# Patient Record
Sex: Female | Born: 1935 | Race: White | Hispanic: No | Smoking: Never smoker
Health system: Southern US, Community
[De-identification: ages and names within clinical notes are randomized; demographics above are authoritative.]

## PROBLEM LIST (undated history)

## (undated) DIAGNOSIS — J309 Allergic rhinitis, unspecified: Secondary | ICD-10-CM

## (undated) DIAGNOSIS — H6980 Other specified disorders of Eustachian tube, unspecified ear: Secondary | ICD-10-CM

## (undated) DIAGNOSIS — N183 Chronic kidney disease, stage 3 (moderate): Principal | ICD-10-CM

## (undated) DIAGNOSIS — E669 Obesity, unspecified: Secondary | ICD-10-CM

## (undated) DIAGNOSIS — I2510 Atherosclerotic heart disease of native coronary artery without angina pectoris: Secondary | ICD-10-CM

## (undated) DIAGNOSIS — H3530 Unspecified macular degeneration: Secondary | ICD-10-CM

## (undated) DIAGNOSIS — D631 Anemia in chronic kidney disease: Principal | ICD-10-CM

## (undated) DIAGNOSIS — J4489 Other specified chronic obstructive pulmonary disease: Secondary | ICD-10-CM

## (undated) DIAGNOSIS — J449 Chronic obstructive pulmonary disease, unspecified: Secondary | ICD-10-CM

## (undated) DIAGNOSIS — K219 Gastro-esophageal reflux disease without esophagitis: Secondary | ICD-10-CM

## (undated) DIAGNOSIS — D509 Iron deficiency anemia, unspecified: Secondary | ICD-10-CM

## (undated) DIAGNOSIS — H6990 Unspecified Eustachian tube disorder, unspecified ear: Secondary | ICD-10-CM

## (undated) HISTORY — DX: Other specified chronic obstructive pulmonary disease: J44.89

## (undated) HISTORY — DX: Chronic obstructive pulmonary disease, unspecified: J44.9

## (undated) HISTORY — DX: Obesity, unspecified: E66.9

## (undated) HISTORY — DX: Atherosclerotic heart disease of native coronary artery without angina pectoris: I25.10

## (undated) HISTORY — DX: Anemia in chronic kidney disease: D63.1

## (undated) HISTORY — DX: Unspecified eustachian tube disorder, unspecified ear: H69.90

## (undated) HISTORY — DX: Gastro-esophageal reflux disease without esophagitis: K21.9

## (undated) HISTORY — PX: CORONARY ARTERY BYPASS GRAFT: SHX141

## (undated) HISTORY — DX: Other specified disorders of Eustachian tube, unspecified ear: H69.80

## (undated) HISTORY — DX: Iron deficiency anemia, unspecified: D50.9

## (undated) HISTORY — DX: Unspecified macular degeneration: H35.30

## (undated) HISTORY — DX: Allergic rhinitis, unspecified: J30.9

## (undated) HISTORY — DX: Chronic kidney disease, stage 3 (moderate): N18.3

---

## 1997-05-19 ENCOUNTER — Encounter (HOSPITAL_COMMUNITY): Admission: RE | Admit: 1997-05-19 | Discharge: 1997-08-17 | Payer: Self-pay | Admitting: Interventional Cardiology

## 1997-06-14 ENCOUNTER — Other Ambulatory Visit: Admission: RE | Admit: 1997-06-14 | Discharge: 1997-06-14 | Payer: Self-pay | Admitting: Internal Medicine

## 1997-08-29 ENCOUNTER — Other Ambulatory Visit: Admission: RE | Admit: 1997-08-29 | Discharge: 1997-08-29 | Payer: Self-pay | Admitting: Internal Medicine

## 1998-02-20 ENCOUNTER — Ambulatory Visit (HOSPITAL_COMMUNITY): Admission: RE | Admit: 1998-02-20 | Discharge: 1998-02-20 | Payer: Self-pay | Admitting: Hematology and Oncology

## 1998-12-05 ENCOUNTER — Other Ambulatory Visit: Admission: RE | Admit: 1998-12-05 | Discharge: 1998-12-05 | Payer: Self-pay | Admitting: Otolaryngology

## 1999-02-21 ENCOUNTER — Encounter: Payer: Self-pay | Admitting: Hematology and Oncology

## 1999-05-09 ENCOUNTER — Encounter: Payer: Self-pay | Admitting: Otolaryngology

## 1999-05-11 ENCOUNTER — Encounter (INDEPENDENT_AMBULATORY_CARE_PROVIDER_SITE_OTHER): Payer: Self-pay | Admitting: Specialist

## 1999-05-11 ENCOUNTER — Encounter: Payer: Self-pay | Admitting: Otolaryngology

## 1999-05-11 ENCOUNTER — Observation Stay (HOSPITAL_COMMUNITY): Admission: RE | Admit: 1999-05-11 | Discharge: 1999-05-12 | Payer: Self-pay | Admitting: Otolaryngology

## 2000-06-25 ENCOUNTER — Ambulatory Visit (HOSPITAL_COMMUNITY): Admission: RE | Admit: 2000-06-25 | Discharge: 2000-06-25 | Payer: Self-pay | Admitting: Gastroenterology

## 2004-02-07 ENCOUNTER — Ambulatory Visit: Payer: Self-pay | Admitting: Hematology & Oncology

## 2004-04-27 ENCOUNTER — Ambulatory Visit: Payer: Self-pay | Admitting: Hematology & Oncology

## 2004-06-11 ENCOUNTER — Ambulatory Visit (HOSPITAL_COMMUNITY): Admission: RE | Admit: 2004-06-11 | Discharge: 2004-06-11 | Payer: Self-pay | Admitting: Hematology & Oncology

## 2004-06-29 ENCOUNTER — Ambulatory Visit: Payer: Self-pay | Admitting: Hematology & Oncology

## 2004-07-17 ENCOUNTER — Ambulatory Visit (HOSPITAL_COMMUNITY): Admission: RE | Admit: 2004-07-17 | Discharge: 2004-07-17 | Payer: Self-pay | Admitting: Interventional Cardiology

## 2004-08-24 ENCOUNTER — Ambulatory Visit: Payer: Self-pay | Admitting: Internal Medicine

## 2004-09-04 ENCOUNTER — Ambulatory Visit: Payer: Self-pay | Admitting: Hematology & Oncology

## 2004-09-24 ENCOUNTER — Encounter: Admission: RE | Admit: 2004-09-24 | Discharge: 2004-09-24 | Payer: Self-pay | Admitting: Internal Medicine

## 2004-09-25 ENCOUNTER — Ambulatory Visit: Payer: Self-pay | Admitting: Internal Medicine

## 2004-09-26 ENCOUNTER — Emergency Department (HOSPITAL_COMMUNITY): Admission: EM | Admit: 2004-09-26 | Discharge: 2004-09-26 | Payer: Self-pay | Admitting: Family Medicine

## 2004-10-10 ENCOUNTER — Encounter: Admission: RE | Admit: 2004-10-10 | Discharge: 2004-10-10 | Payer: Self-pay | Admitting: Orthopedic Surgery

## 2004-10-30 ENCOUNTER — Ambulatory Visit: Payer: Self-pay | Admitting: Hematology & Oncology

## 2004-12-19 ENCOUNTER — Ambulatory Visit: Payer: Self-pay | Admitting: Hematology & Oncology

## 2004-12-27 ENCOUNTER — Ambulatory Visit: Payer: Self-pay | Admitting: Internal Medicine

## 2005-02-13 ENCOUNTER — Ambulatory Visit: Payer: Self-pay | Admitting: Hematology & Oncology

## 2005-04-10 ENCOUNTER — Ambulatory Visit: Payer: Self-pay | Admitting: Hematology & Oncology

## 2005-06-05 ENCOUNTER — Ambulatory Visit: Payer: Self-pay | Admitting: Hematology & Oncology

## 2005-06-06 LAB — CBC WITH DIFFERENTIAL/PLATELET
Basophils Absolute: 0 10*3/uL (ref 0.0–0.1)
EOS%: 3.7 % (ref 0.0–7.0)
HCT: 33.6 % — ABNORMAL LOW (ref 34.8–46.6)
HGB: 11 g/dL — ABNORMAL LOW (ref 11.6–15.9)
LYMPH%: 25.1 % (ref 14.0–48.0)
MCH: 28 pg (ref 26.0–34.0)
MCV: 85.6 fL (ref 81.0–101.0)
MONO%: 9.1 % (ref 0.0–13.0)
NEUT%: 61.5 % (ref 39.6–76.8)
Platelets: 386 10*3/uL (ref 145–400)
lymph#: 1.6 10*3/uL (ref 0.9–3.3)

## 2005-06-27 ENCOUNTER — Ambulatory Visit: Payer: Self-pay | Admitting: Internal Medicine

## 2005-07-04 LAB — CBC WITH DIFFERENTIAL/PLATELET
BASO%: 0.5 % (ref 0.0–2.0)
Basophils Absolute: 0 10*3/uL (ref 0.0–0.1)
EOS%: 4.2 % (ref 0.0–7.0)
Eosinophils Absolute: 0.2 10*3/uL (ref 0.0–0.5)
HCT: 35.4 % (ref 34.8–46.6)
HGB: 11.6 g/dL (ref 11.6–15.9)
LYMPH%: 29.8 % (ref 14.0–48.0)
MCH: 27.8 pg (ref 26.0–34.0)
MCHC: 32.8 g/dL (ref 32.0–36.0)
MCV: 84.7 fL (ref 81.0–101.0)
MONO#: 0.5 10*3/uL (ref 0.1–0.9)
MONO%: 9.4 % (ref 0.0–13.0)
NEUT#: 3.1 10*3/uL (ref 1.5–6.5)
NEUT%: 56.1 % (ref 39.6–76.8)
Platelets: 378 10*3/uL (ref 145–400)
RBC: 4.18 10*6/uL (ref 3.70–5.32)
RDW: 15.4 % — ABNORMAL HIGH (ref 11.3–14.5)
WBC: 5.6 10*3/uL (ref 3.9–10.0)
lymph#: 1.7 10*3/uL (ref 0.9–3.3)

## 2005-07-17 ENCOUNTER — Ambulatory Visit: Payer: Self-pay | Admitting: Hematology & Oncology

## 2005-07-17 LAB — CBC & DIFF AND RETIC
BASO%: 0.4 % (ref 0.0–2.0)
EOS%: 4.7 % (ref 0.0–7.0)
HCT: 35.6 % (ref 34.8–46.6)
LYMPH%: 26.6 % (ref 14.0–48.0)
MCH: 27.5 pg (ref 26.0–34.0)
MCHC: 32.5 g/dL (ref 32.0–36.0)
MONO%: 11.1 % (ref 0.0–13.0)
NEUT%: 57.2 % (ref 39.6–76.8)
Platelets: 342 10*3/uL (ref 145–400)
RBC: 4.2 10*6/uL (ref 3.70–5.32)
Retic %: 2.7 % — ABNORMAL HIGH (ref 0.4–2.3)

## 2005-08-01 LAB — CBC WITH DIFFERENTIAL/PLATELET
Basophils Absolute: 0 10*3/uL (ref 0.0–0.1)
Eosinophils Absolute: 0.3 10*3/uL (ref 0.0–0.5)
HCT: 32.9 % — ABNORMAL LOW (ref 34.8–46.6)
HGB: 10.7 g/dL — ABNORMAL LOW (ref 11.6–15.9)
LYMPH%: 25.9 % (ref 14.0–48.0)
MCV: 82.8 fL (ref 81.0–101.0)
MONO#: 0.5 10*3/uL (ref 0.1–0.9)
MONO%: 9.2 % (ref 0.0–13.0)
NEUT#: 3.5 10*3/uL (ref 1.5–6.5)
Platelets: 368 10*3/uL (ref 145–400)
RBC: 3.97 10*6/uL (ref 3.70–5.32)
WBC: 5.8 10*3/uL (ref 3.9–10.0)

## 2005-08-12 ENCOUNTER — Encounter: Admission: RE | Admit: 2005-08-12 | Discharge: 2005-08-12 | Payer: Self-pay | Admitting: Neurosurgery

## 2005-08-25 ENCOUNTER — Observation Stay (HOSPITAL_COMMUNITY): Admission: EM | Admit: 2005-08-25 | Discharge: 2005-08-26 | Payer: Self-pay | Admitting: Family Medicine

## 2005-08-29 ENCOUNTER — Ambulatory Visit: Payer: Self-pay | Admitting: Hematology & Oncology

## 2005-08-29 LAB — CBC WITH DIFFERENTIAL/PLATELET
BASO%: 0.5 % (ref 0.0–2.0)
LYMPH%: 21.3 % (ref 14.0–48.0)
MCHC: 32.8 g/dL (ref 32.0–36.0)
MONO#: 0.7 10*3/uL (ref 0.1–0.9)
MONO%: 9.8 % (ref 0.0–13.0)
Platelets: 406 10*3/uL — ABNORMAL HIGH (ref 145–400)
RBC: 4 10*6/uL (ref 3.70–5.32)
RDW: 16.5 % — ABNORMAL HIGH (ref 11.3–14.5)
WBC: 7.3 10*3/uL (ref 3.9–10.0)

## 2005-09-26 ENCOUNTER — Ambulatory Visit: Payer: Self-pay | Admitting: Hematology & Oncology

## 2005-09-26 LAB — CBC WITH DIFFERENTIAL/PLATELET
BASO%: 0.6 % (ref 0.0–2.0)
LYMPH%: 22.1 % (ref 14.0–48.0)
MCH: 25.9 pg — ABNORMAL LOW (ref 26.0–34.0)
MCHC: 32.6 g/dL (ref 32.0–36.0)
MCV: 79.6 fL — ABNORMAL LOW (ref 81.0–101.0)
MONO%: 9.1 % (ref 0.0–13.0)
Platelets: 383 10*3/uL (ref 145–400)
RBC: 3.62 10*6/uL — ABNORMAL LOW (ref 3.70–5.32)

## 2005-10-22 ENCOUNTER — Ambulatory Visit: Payer: Self-pay | Admitting: Hematology & Oncology

## 2005-10-24 LAB — CBC WITH DIFFERENTIAL/PLATELET
BASO%: 0.5 % (ref 0.0–2.0)
EOS%: 1.9 % (ref 0.0–7.0)
LYMPH%: 14.9 % (ref 14.0–48.0)
MCHC: 32.5 g/dL (ref 32.0–36.0)
MONO#: 0.4 10*3/uL (ref 0.1–0.9)
RBC: 3.55 10*6/uL — ABNORMAL LOW (ref 3.70–5.32)
WBC: 7.4 10*3/uL (ref 3.9–10.0)
lymph#: 1.1 10*3/uL (ref 0.9–3.3)

## 2005-10-31 ENCOUNTER — Encounter: Admission: RE | Admit: 2005-10-31 | Discharge: 2005-10-31 | Payer: Self-pay | Admitting: Internal Medicine

## 2005-11-07 ENCOUNTER — Encounter: Admission: RE | Admit: 2005-11-07 | Discharge: 2005-11-07 | Payer: Self-pay | Admitting: Internal Medicine

## 2005-11-13 LAB — CBC WITH DIFFERENTIAL/PLATELET
Basophils Absolute: 0 10*3/uL (ref 0.0–0.1)
Eosinophils Absolute: 0.3 10*3/uL (ref 0.0–0.5)
HCT: 25 % — ABNORMAL LOW (ref 34.8–46.6)
HGB: 8.2 g/dL — ABNORMAL LOW (ref 11.6–15.9)
LYMPH%: 23.8 % (ref 14.0–48.0)
MONO#: 0.5 10*3/uL (ref 0.1–0.9)
NEUT%: 62 % (ref 39.6–76.8)
Platelets: 388 10*3/uL (ref 145–400)
WBC: 5.5 10*3/uL (ref 3.9–10.0)
lymph#: 1.3 10*3/uL (ref 0.9–3.3)

## 2005-11-14 ENCOUNTER — Encounter: Admission: RE | Admit: 2005-11-14 | Discharge: 2005-11-14 | Payer: Self-pay | Admitting: Internal Medicine

## 2005-11-15 LAB — LACTATE DEHYDROGENASE: LDH: 156 U/L (ref 94–250)

## 2005-11-15 LAB — COMPREHENSIVE METABOLIC PANEL
ALT: 12 U/L (ref 0–40)
BUN: 12 mg/dL (ref 6–23)
CO2: 27 mEq/L (ref 19–32)
Calcium: 9.5 mg/dL (ref 8.4–10.5)
Chloride: 102 mEq/L (ref 96–112)
Creatinine, Ser: 0.74 mg/dL (ref 0.40–1.20)
Glucose, Bld: 102 mg/dL — ABNORMAL HIGH (ref 70–99)
Total Bilirubin: 0.6 mg/dL (ref 0.3–1.2)

## 2005-11-15 LAB — FERRITIN: Ferritin: 14 ng/mL (ref 10–291)

## 2005-12-23 ENCOUNTER — Ambulatory Visit: Payer: Self-pay | Admitting: Hematology & Oncology

## 2005-12-25 LAB — CHCC SMEAR

## 2005-12-25 LAB — CBC & DIFF AND RETIC
BASO%: 0.3 % (ref 0.0–2.0)
EOS%: 4.2 % (ref 0.0–7.0)
HCT: 31.4 % — ABNORMAL LOW (ref 34.8–46.6)
IRF: 0.37 — ABNORMAL HIGH (ref 0.130–0.330)
MCH: 26.8 pg (ref 26.0–34.0)
MCHC: 32.9 g/dL (ref 32.0–36.0)
NEUT%: 66.8 % (ref 39.6–76.8)
lymph#: 1.3 10*3/uL (ref 0.9–3.3)

## 2005-12-26 ENCOUNTER — Ambulatory Visit: Payer: Self-pay | Admitting: Internal Medicine

## 2006-01-22 LAB — CBC WITH DIFFERENTIAL/PLATELET
Eosinophils Absolute: 0.3 10*3/uL (ref 0.0–0.5)
HCT: 32.4 % — ABNORMAL LOW (ref 34.8–46.6)
LYMPH%: 20.3 % (ref 14.0–48.0)
MONO#: 0.5 10*3/uL (ref 0.1–0.9)
NEUT#: 4.4 10*3/uL (ref 1.5–6.5)
NEUT%: 67.3 % (ref 39.6–76.8)
Platelets: 401 10*3/uL — ABNORMAL HIGH (ref 145–400)
WBC: 6.6 10*3/uL (ref 3.9–10.0)

## 2006-01-23 ENCOUNTER — Ambulatory Visit: Payer: Self-pay | Admitting: Internal Medicine

## 2006-02-13 ENCOUNTER — Ambulatory Visit: Payer: Self-pay | Admitting: Hematology & Oncology

## 2006-02-17 LAB — CBC WITH DIFFERENTIAL/PLATELET
BASO%: 0.6 % (ref 0.0–2.0)
EOS%: 4.7 % (ref 0.0–7.0)
HCT: 36 % (ref 34.8–46.6)
LYMPH%: 21.6 % (ref 14.0–48.0)
MCH: 26.4 pg (ref 26.0–34.0)
MCHC: 31.4 g/dL — ABNORMAL LOW (ref 32.0–36.0)
MCV: 84.2 fL (ref 81.0–101.0)
MONO%: 7.9 % (ref 0.0–13.0)
NEUT%: 65.2 % (ref 39.6–76.8)
Platelets: 419 10*3/uL — ABNORMAL HIGH (ref 145–400)

## 2006-02-19 ENCOUNTER — Inpatient Hospital Stay (HOSPITAL_COMMUNITY): Admission: RE | Admit: 2006-02-19 | Discharge: 2006-02-22 | Payer: Self-pay | Admitting: Neurosurgery

## 2006-03-17 ENCOUNTER — Ambulatory Visit: Payer: Self-pay | Admitting: Internal Medicine

## 2006-03-19 LAB — CBC WITH DIFFERENTIAL/PLATELET
Basophils Absolute: 0 10*3/uL (ref 0.0–0.1)
Eosinophils Absolute: 0.4 10*3/uL (ref 0.0–0.5)
HCT: 31.9 % — ABNORMAL LOW (ref 34.8–46.6)
HGB: 10.4 g/dL — ABNORMAL LOW (ref 11.6–15.9)
LYMPH%: 17.3 % (ref 14.0–48.0)
MCV: 84 fL (ref 81.0–101.0)
MONO#: 0.6 10*3/uL (ref 0.1–0.9)
MONO%: 7.5 % (ref 0.0–13.0)
NEUT#: 5.5 10*3/uL (ref 1.5–6.5)
Platelets: 408 10*3/uL — ABNORMAL HIGH (ref 145–400)
WBC: 7.9 10*3/uL (ref 3.9–10.0)

## 2006-03-26 LAB — CBC WITH DIFFERENTIAL/PLATELET
Eosinophils Absolute: 0.2 10*3/uL (ref 0.0–0.5)
HCT: 31.5 % — ABNORMAL LOW (ref 34.8–46.6)
LYMPH%: 19.1 % (ref 14.0–48.0)
MCHC: 33 g/dL (ref 32.0–36.0)
MCV: 83.8 fL (ref 81.0–101.0)
MONO#: 0.6 10*3/uL (ref 0.1–0.9)
MONO%: 10 % (ref 0.0–13.0)
NEUT%: 66.7 % (ref 39.6–76.8)
Platelets: 395 10*3/uL (ref 145–400)
RBC: 3.75 10*6/uL (ref 3.70–5.32)
WBC: 6.4 10*3/uL (ref 3.9–10.0)

## 2006-03-28 LAB — FERRITIN: Ferritin: 144 ng/mL (ref 10–291)

## 2006-04-14 ENCOUNTER — Ambulatory Visit: Payer: Self-pay | Admitting: Hematology & Oncology

## 2006-04-16 LAB — CBC WITH DIFFERENTIAL/PLATELET
Basophils Absolute: 0 10*3/uL (ref 0.0–0.1)
EOS%: 4.6 % (ref 0.0–7.0)
Eosinophils Absolute: 0.3 10*3/uL (ref 0.0–0.5)
HGB: 10.8 g/dL — ABNORMAL LOW (ref 11.6–15.9)
LYMPH%: 22.4 % (ref 14.0–48.0)
MCH: 27 pg (ref 26.0–34.0)
MCV: 82.3 fL (ref 81.0–101.0)
MONO%: 8.5 % (ref 0.0–13.0)
NEUT#: 4.5 10*3/uL (ref 1.5–6.5)
NEUT%: 64 % (ref 39.6–76.8)
Platelets: 398 10*3/uL (ref 145–400)

## 2006-04-25 ENCOUNTER — Emergency Department (HOSPITAL_COMMUNITY): Admission: EM | Admit: 2006-04-25 | Discharge: 2006-04-25 | Payer: Self-pay | Admitting: Emergency Medicine

## 2006-05-14 LAB — CBC WITH DIFFERENTIAL/PLATELET
BASO%: 0.5 % (ref 0.0–2.0)
EOS%: 3.9 % (ref 0.0–7.0)
Eosinophils Absolute: 0.3 10*3/uL (ref 0.0–0.5)
LYMPH%: 18.8 % (ref 14.0–48.0)
MCH: 26.8 pg (ref 26.0–34.0)
MCHC: 32.9 g/dL (ref 32.0–36.0)
MCV: 81.3 fL (ref 81.0–101.0)
MONO%: 7.8 % (ref 0.0–13.0)
Platelets: 436 10*3/uL — ABNORMAL HIGH (ref 145–400)
RBC: 4.16 10*6/uL (ref 3.70–5.32)
RDW: 16.7 % — ABNORMAL HIGH (ref 11.3–14.5)

## 2006-06-06 ENCOUNTER — Ambulatory Visit: Payer: Self-pay | Admitting: Hematology & Oncology

## 2006-06-11 LAB — CBC WITH DIFFERENTIAL/PLATELET
BASO%: 0.6 % (ref 0.0–2.0)
EOS%: 3.7 % (ref 0.0–7.0)
HCT: 31.3 % — ABNORMAL LOW (ref 34.8–46.6)
MCH: 26.5 pg (ref 26.0–34.0)
MCHC: 33 g/dL (ref 32.0–36.0)
NEUT%: 66.7 % (ref 39.6–76.8)
lymph#: 1.5 10*3/uL (ref 0.9–3.3)

## 2006-06-16 ENCOUNTER — Ambulatory Visit: Payer: Self-pay | Admitting: Internal Medicine

## 2006-06-26 ENCOUNTER — Encounter: Admission: RE | Admit: 2006-06-26 | Discharge: 2006-06-26 | Payer: Self-pay | Admitting: Geriatric Medicine

## 2006-07-09 LAB — CBC WITH DIFFERENTIAL/PLATELET
BASO%: 0.8 % (ref 0.0–2.0)
Basophils Absolute: 0 10*3/uL (ref 0.0–0.1)
EOS%: 6.5 % (ref 0.0–7.0)
HGB: 10.8 g/dL — ABNORMAL LOW (ref 11.6–15.9)
MCH: 25.6 pg — ABNORMAL LOW (ref 26.0–34.0)
MCHC: 32.7 g/dL (ref 32.0–36.0)
RDW: 16.3 % — ABNORMAL HIGH (ref 11.3–14.5)
lymph#: 1.5 10*3/uL (ref 0.9–3.3)

## 2006-08-04 ENCOUNTER — Ambulatory Visit: Payer: Self-pay | Admitting: Hematology & Oncology

## 2006-08-06 LAB — CBC WITH DIFFERENTIAL/PLATELET
Basophils Absolute: 0.1 10*3/uL (ref 0.0–0.1)
Eosinophils Absolute: 0.3 10*3/uL (ref 0.0–0.5)
HGB: 9.6 g/dL — ABNORMAL LOW (ref 11.6–15.9)
MONO#: 0.5 10*3/uL (ref 0.1–0.9)
NEUT#: 3.4 10*3/uL (ref 1.5–6.5)
RDW: 16.6 % — ABNORMAL HIGH (ref 11.3–14.5)
lymph#: 1.4 10*3/uL (ref 0.9–3.3)

## 2006-09-03 LAB — CBC WITH DIFFERENTIAL/PLATELET
Eosinophils Absolute: 0.3 10*3/uL (ref 0.0–0.5)
HCT: 31.2 % — ABNORMAL LOW (ref 34.8–46.6)
LYMPH%: 24.2 % (ref 14.0–48.0)
MONO#: 0.6 10*3/uL (ref 0.1–0.9)
NEUT#: 3.5 10*3/uL (ref 1.5–6.5)
NEUT%: 59.8 % (ref 39.6–76.8)
Platelets: 386 10*3/uL (ref 145–400)
WBC: 5.9 10*3/uL (ref 3.9–10.0)

## 2006-09-12 LAB — CBC WITH DIFFERENTIAL/PLATELET
Basophils Absolute: 0 10*3/uL (ref 0.0–0.1)
EOS%: 4.4 % (ref 0.0–7.0)
HCT: 33.3 % — ABNORMAL LOW (ref 34.8–46.6)
HGB: 11.1 g/dL — ABNORMAL LOW (ref 11.6–15.9)
MCH: 27.4 pg (ref 26.0–34.0)
MCV: 82.5 fL (ref 81.0–101.0)
MONO%: 10.7 % (ref 0.0–13.0)
NEUT%: 63.5 % (ref 39.6–76.8)
lymph#: 1.3 10*3/uL (ref 0.9–3.3)

## 2006-09-16 ENCOUNTER — Ambulatory Visit: Payer: Self-pay | Admitting: Hematology & Oncology

## 2006-09-18 ENCOUNTER — Ambulatory Visit (HOSPITAL_COMMUNITY): Admission: RE | Admit: 2006-09-18 | Discharge: 2006-09-18 | Payer: Self-pay | Admitting: Hematology & Oncology

## 2006-10-08 LAB — CBC WITH DIFFERENTIAL/PLATELET
Eosinophils Absolute: 0.3 10*3/uL (ref 0.0–0.5)
LYMPH%: 25.9 % (ref 14.0–48.0)
MCV: 83 fL (ref 81.0–101.0)
MONO%: 10.2 % (ref 0.0–13.0)
NEUT#: 3.2 10*3/uL (ref 1.5–6.5)
NEUT%: 57.9 % (ref 39.6–76.8)
Platelets: 349 10*3/uL (ref 145–400)
RBC: 4.45 10*6/uL (ref 3.70–5.32)

## 2006-10-28 ENCOUNTER — Ambulatory Visit: Payer: Self-pay | Admitting: Hematology & Oncology

## 2006-10-30 LAB — CBC WITH DIFFERENTIAL/PLATELET
BASO%: 0.5 % (ref 0.0–2.0)
EOS%: 5.9 % (ref 0.0–7.0)
LYMPH%: 27.8 % (ref 14.0–48.0)
MCH: 28 pg (ref 26.0–34.0)
MCHC: 33.7 g/dL (ref 32.0–36.0)
MCV: 83.2 fL (ref 81.0–101.0)
MONO#: 0.5 10*3/uL (ref 0.1–0.9)
MONO%: 9 % (ref 0.0–13.0)
Platelets: 308 10*3/uL (ref 145–400)
RBC: 3.86 10*6/uL (ref 3.70–5.32)
WBC: 5.4 10*3/uL (ref 3.9–10.0)

## 2006-10-30 LAB — FERRITIN: Ferritin: 280 ng/mL (ref 10–291)

## 2006-11-19 LAB — CBC WITH DIFFERENTIAL/PLATELET
BASO%: 0.5 % (ref 0.0–2.0)
EOS%: 4.5 % (ref 0.0–7.0)
MCH: 28.1 pg (ref 26.0–34.0)
MCHC: 33.5 g/dL (ref 32.0–36.0)
MCV: 83.8 fL (ref 81.0–101.0)
MONO%: 9.6 % (ref 0.0–13.0)
RDW: 17.8 % — ABNORMAL HIGH (ref 11.3–14.5)
lymph#: 1.3 10*3/uL (ref 0.9–3.3)

## 2006-12-08 ENCOUNTER — Ambulatory Visit: Payer: Self-pay | Admitting: Hematology & Oncology

## 2006-12-10 LAB — CBC WITH DIFFERENTIAL/PLATELET
EOS%: 4.7 % (ref 0.0–7.0)
HGB: 11.6 g/dL (ref 11.6–15.9)
MCH: 27.4 pg (ref 26.0–34.0)
MCV: 82.3 fL (ref 81.0–101.0)
MONO%: 10.6 % (ref 0.0–13.0)
NEUT#: 3.2 10*3/uL (ref 1.5–6.5)
RBC: 4.24 10*6/uL (ref 3.70–5.32)
RDW: 16.6 % — ABNORMAL HIGH (ref 11.3–14.5)
lymph#: 1.3 10*3/uL (ref 0.9–3.3)

## 2006-12-13 DIAGNOSIS — D509 Iron deficiency anemia, unspecified: Secondary | ICD-10-CM | POA: Insufficient documentation

## 2006-12-13 DIAGNOSIS — J4489 Other specified chronic obstructive pulmonary disease: Secondary | ICD-10-CM | POA: Insufficient documentation

## 2006-12-13 DIAGNOSIS — R0602 Shortness of breath: Secondary | ICD-10-CM | POA: Insufficient documentation

## 2006-12-13 DIAGNOSIS — J449 Chronic obstructive pulmonary disease, unspecified: Secondary | ICD-10-CM | POA: Insufficient documentation

## 2006-12-13 DIAGNOSIS — I2510 Atherosclerotic heart disease of native coronary artery without angina pectoris: Secondary | ICD-10-CM | POA: Insufficient documentation

## 2006-12-13 DIAGNOSIS — H6980 Other specified disorders of Eustachian tube, unspecified ear: Secondary | ICD-10-CM | POA: Insufficient documentation

## 2006-12-13 DIAGNOSIS — E669 Obesity, unspecified: Secondary | ICD-10-CM | POA: Insufficient documentation

## 2006-12-13 DIAGNOSIS — J309 Allergic rhinitis, unspecified: Secondary | ICD-10-CM | POA: Insufficient documentation

## 2006-12-15 ENCOUNTER — Ambulatory Visit: Payer: Self-pay | Admitting: Internal Medicine

## 2006-12-31 LAB — CBC WITH DIFFERENTIAL/PLATELET
Basophils Absolute: 0 10*3/uL (ref 0.0–0.1)
Eosinophils Absolute: 0.3 10*3/uL (ref 0.0–0.5)
HGB: 11.4 g/dL — ABNORMAL LOW (ref 11.6–15.9)
MONO#: 0.5 10*3/uL (ref 0.1–0.9)
NEUT#: 4.1 10*3/uL (ref 1.5–6.5)
RBC: 4.23 10*6/uL (ref 3.70–5.32)
RDW: 16.7 % — ABNORMAL HIGH (ref 11.3–14.5)
WBC: 6.2 10*3/uL (ref 3.9–10.0)
lymph#: 1.3 10*3/uL (ref 0.9–3.3)

## 2007-01-01 ENCOUNTER — Encounter: Admission: RE | Admit: 2007-01-01 | Discharge: 2007-01-01 | Payer: Self-pay | Admitting: Internal Medicine

## 2007-01-27 ENCOUNTER — Ambulatory Visit: Payer: Self-pay | Admitting: Hematology & Oncology

## 2007-01-29 LAB — COMPREHENSIVE METABOLIC PANEL
BUN: 14 mg/dL (ref 6–23)
CO2: 26 mEq/L (ref 19–32)
Glucose, Bld: 99 mg/dL (ref 70–99)
Sodium: 138 mEq/L (ref 135–145)
Total Bilirubin: 0.6 mg/dL (ref 0.3–1.2)
Total Protein: 6.7 g/dL (ref 6.0–8.3)

## 2007-01-29 LAB — CBC WITH DIFFERENTIAL/PLATELET
Basophils Absolute: 0 10*3/uL (ref 0.0–0.1)
Eosinophils Absolute: 0.2 10*3/uL (ref 0.0–0.5)
HCT: 32.6 % — ABNORMAL LOW (ref 34.8–46.6)
HGB: 10.8 g/dL — ABNORMAL LOW (ref 11.6–15.9)
LYMPH%: 23.9 % (ref 14.0–48.0)
MONO#: 0.5 10*3/uL (ref 0.1–0.9)
NEUT#: 3.6 10*3/uL (ref 1.5–6.5)
NEUT%: 63.1 % (ref 39.6–76.8)
Platelets: 354 10*3/uL (ref 145–400)
RBC: 4.06 10*6/uL (ref 3.70–5.32)
WBC: 5.8 10*3/uL (ref 3.9–10.0)

## 2007-01-29 LAB — FERRITIN: Ferritin: 158 ng/mL (ref 10–291)

## 2007-03-10 ENCOUNTER — Ambulatory Visit: Payer: Self-pay | Admitting: Hematology & Oncology

## 2007-03-12 LAB — CBC & DIFF AND RETIC
Basophils Absolute: 0 10*3/uL (ref 0.0–0.1)
Eosinophils Absolute: 0.2 10*3/uL (ref 0.0–0.5)
HCT: 32.9 % — ABNORMAL LOW (ref 34.8–46.6)
HGB: 10.9 g/dL — ABNORMAL LOW (ref 11.6–15.9)
IRF: 0.38 — ABNORMAL HIGH (ref 0.130–0.330)
LYMPH%: 12.7 % — ABNORMAL LOW (ref 14.0–48.0)
MONO#: 0.5 10*3/uL (ref 0.1–0.9)
NEUT%: 77.3 % — ABNORMAL HIGH (ref 39.6–76.8)
Platelets: 356 10*3/uL (ref 145–400)
WBC: 7.4 10*3/uL (ref 3.9–10.0)
lymph#: 0.9 10*3/uL (ref 0.9–3.3)

## 2007-03-12 LAB — CHCC SMEAR

## 2007-03-12 LAB — FERRITIN: Ferritin: 977 ng/mL — ABNORMAL HIGH (ref 10–291)

## 2007-03-26 ENCOUNTER — Encounter: Payer: Self-pay | Admitting: Internal Medicine

## 2007-04-02 LAB — CBC & DIFF AND RETIC
Basophils Absolute: 0 10*3/uL (ref 0.0–0.1)
EOS%: 5 % (ref 0.0–7.0)
Eosinophils Absolute: 0.3 10*3/uL (ref 0.0–0.5)
LYMPH%: 24.3 % (ref 14.0–48.0)
MCH: 26 pg (ref 26.0–34.0)
MCV: 84.3 fL (ref 81.0–101.0)
MONO%: 7.2 % (ref 0.0–13.0)
NEUT#: 3.4 10*3/uL (ref 1.5–6.5)
Platelets: 485 10*3/uL — ABNORMAL HIGH (ref 145–400)
RBC: 4.47 10*6/uL (ref 3.70–5.32)
Retic %: 1.1 % (ref 0.4–2.3)

## 2007-04-28 ENCOUNTER — Ambulatory Visit: Payer: Self-pay | Admitting: Hematology & Oncology

## 2007-04-30 LAB — CBC & DIFF AND RETIC
BASO%: 0.7 % (ref 0.0–2.0)
EOS%: 5.4 % (ref 0.0–7.0)
HCT: 33.8 % — ABNORMAL LOW (ref 34.8–46.6)
LYMPH%: 25.2 % (ref 14.0–48.0)
MCH: 28.9 pg (ref 26.0–34.0)
MCHC: 34 g/dL (ref 32.0–36.0)
MCV: 85 fL (ref 81.0–101.0)
MONO%: 10.4 % (ref 0.0–13.0)
NEUT%: 58.3 % (ref 39.6–76.8)
Platelets: 361 10*3/uL (ref 145–400)
RBC: 3.98 10*6/uL (ref 3.70–5.32)

## 2007-04-30 LAB — FERRITIN: Ferritin: 526 ng/mL — ABNORMAL HIGH (ref 10–291)

## 2007-06-09 ENCOUNTER — Ambulatory Visit: Payer: Self-pay | Admitting: Hematology & Oncology

## 2007-06-11 LAB — CBC & DIFF AND RETIC
EOS%: 4.3 % (ref 0.0–7.0)
HGB: 12.4 g/dL (ref 11.6–15.9)
IRF: 0.32 (ref 0.130–0.330)
MCH: 29.2 pg (ref 26.0–34.0)
MCV: 84.4 fL (ref 81.0–101.0)
MONO%: 8.8 % (ref 0.0–13.0)
NEUT#: 4.1 10*3/uL (ref 1.5–6.5)
RBC: 4.26 10*6/uL (ref 3.70–5.32)
RDW: 15.6 % — ABNORMAL HIGH (ref 11.3–14.5)
RETIC #: 57.9 10*3/uL (ref 19.7–115.1)
Retic %: 1.4 % (ref 0.4–2.3)
lymph#: 1.6 10*3/uL (ref 0.9–3.3)

## 2007-06-15 ENCOUNTER — Ambulatory Visit: Payer: Self-pay | Admitting: Internal Medicine

## 2007-06-21 DIAGNOSIS — K219 Gastro-esophageal reflux disease without esophagitis: Secondary | ICD-10-CM | POA: Insufficient documentation

## 2007-06-29 ENCOUNTER — Encounter: Admission: RE | Admit: 2007-06-29 | Discharge: 2007-06-29 | Payer: Self-pay | Admitting: Hematology & Oncology

## 2007-07-27 ENCOUNTER — Ambulatory Visit: Payer: Self-pay | Admitting: Hematology & Oncology

## 2007-07-30 LAB — CBC WITH DIFFERENTIAL/PLATELET
Basophils Absolute: 0.1 10*3/uL (ref 0.0–0.1)
Eosinophils Absolute: 0.3 10*3/uL (ref 0.0–0.5)
HCT: 34.5 % — ABNORMAL LOW (ref 34.8–46.6)
HGB: 11.4 g/dL — ABNORMAL LOW (ref 11.6–15.9)
LYMPH%: 27 % (ref 14.0–48.0)
MCV: 86.2 fL (ref 81.0–101.0)
MONO%: 9.6 % (ref 0.0–13.0)
NEUT#: 3.4 10*3/uL (ref 1.5–6.5)
Platelets: 309 10*3/uL (ref 145–400)

## 2007-08-04 ENCOUNTER — Ambulatory Visit: Payer: Self-pay | Admitting: Vascular Surgery

## 2007-08-26 ENCOUNTER — Ambulatory Visit: Payer: Self-pay | Admitting: Hematology & Oncology

## 2007-09-21 ENCOUNTER — Ambulatory Visit: Payer: Self-pay | Admitting: Hematology & Oncology

## 2007-09-24 LAB — CBC WITH DIFFERENTIAL/PLATELET
BASO%: 0.7 % (ref 0.0–2.0)
EOS%: 5.3 % (ref 0.0–7.0)
HCT: 34.4 % — ABNORMAL LOW (ref 34.8–46.6)
LYMPH%: 23.2 % (ref 14.0–48.0)
MCH: 28.5 pg (ref 26.0–34.0)
MCHC: 33.5 g/dL (ref 32.0–36.0)
MONO%: 9.1 % (ref 0.0–13.0)
NEUT%: 61.7 % (ref 39.6–76.8)
lymph#: 1.3 10*3/uL (ref 0.9–3.3)

## 2007-10-22 LAB — CBC WITH DIFFERENTIAL/PLATELET
BASO%: 0.6 % (ref 0.0–2.0)
HCT: 35.7 % (ref 34.8–46.6)
LYMPH%: 25.9 % (ref 14.0–48.0)
MCH: 28.2 pg (ref 26.0–34.0)
MCHC: 33.2 g/dL (ref 32.0–36.0)
MONO#: 0.4 10*3/uL (ref 0.1–0.9)
NEUT%: 60.2 % (ref 39.6–76.8)
Platelets: 326 10*3/uL (ref 145–400)
WBC: 5.2 10*3/uL (ref 3.9–10.0)

## 2007-11-03 ENCOUNTER — Ambulatory Visit: Payer: Self-pay | Admitting: Vascular Surgery

## 2007-11-18 ENCOUNTER — Ambulatory Visit: Payer: Self-pay | Admitting: Hematology & Oncology

## 2007-11-19 LAB — CBC WITH DIFFERENTIAL (CANCER CENTER ONLY)
BASO#: 0.1 10*3/uL (ref 0.0–0.2)
EOS%: 4.5 % (ref 0.0–7.0)
HCT: 35.4 % (ref 34.8–46.6)
HGB: 11.8 g/dL (ref 11.6–15.9)
LYMPH#: 1.8 10*3/uL (ref 0.9–3.3)
MCH: 26.8 pg (ref 26.0–34.0)
MCHC: 33.4 g/dL (ref 32.0–36.0)
NEUT%: 62.2 % (ref 39.6–80.0)

## 2007-11-19 LAB — FERRITIN: Ferritin: 296 ng/mL — ABNORMAL HIGH (ref 10–291)

## 2007-12-14 ENCOUNTER — Ambulatory Visit: Payer: Self-pay | Admitting: Vascular Surgery

## 2007-12-17 ENCOUNTER — Ambulatory Visit: Payer: Self-pay | Admitting: Hematology & Oncology

## 2007-12-17 LAB — CBC WITH DIFFERENTIAL/PLATELET
Basophils Absolute: 0 10*3/uL (ref 0.0–0.1)
EOS%: 5.8 % (ref 0.0–7.0)
Eosinophils Absolute: 0.3 10*3/uL (ref 0.0–0.5)
HGB: 10.8 g/dL — ABNORMAL LOW (ref 11.6–15.9)
MCH: 28.2 pg (ref 26.0–34.0)
NEUT#: 3.9 10*3/uL (ref 1.5–6.5)
RDW: 16.1 % — ABNORMAL HIGH (ref 11.3–14.5)
WBC: 5.9 10*3/uL (ref 3.9–10.0)
lymph#: 1.2 10*3/uL (ref 0.9–3.3)

## 2007-12-22 ENCOUNTER — Ambulatory Visit: Payer: Self-pay | Admitting: Vascular Surgery

## 2008-01-13 LAB — CBC WITH DIFFERENTIAL/PLATELET
BASO%: 0.6 % (ref 0.0–2.0)
MCHC: 33.2 g/dL (ref 32.0–36.0)
MONO#: 0.5 10*3/uL (ref 0.1–0.9)
RBC: 4.18 10*6/uL (ref 3.70–5.32)
WBC: 5.3 10*3/uL (ref 3.9–10.0)
lymph#: 1.4 10*3/uL (ref 0.9–3.3)

## 2008-01-25 ENCOUNTER — Ambulatory Visit: Payer: Self-pay | Admitting: Internal Medicine

## 2008-01-25 DIAGNOSIS — I872 Venous insufficiency (chronic) (peripheral): Secondary | ICD-10-CM | POA: Insufficient documentation

## 2008-02-08 ENCOUNTER — Ambulatory Visit: Payer: Self-pay | Admitting: Hematology & Oncology

## 2008-02-10 LAB — CBC WITH DIFFERENTIAL/PLATELET
BASO%: 0.5 % (ref 0.0–2.0)
LYMPH%: 16.1 % (ref 14.0–48.0)
MCHC: 33 g/dL (ref 32.0–36.0)
MCV: 83.9 fL (ref 81.0–101.0)
MONO#: 0.6 10*3/uL (ref 0.1–0.9)
MONO%: 6.7 % (ref 0.0–13.0)
Platelets: 355 10*3/uL (ref 145–400)
RBC: 4.41 10*6/uL (ref 3.70–5.32)
RDW: 15.9 % — ABNORMAL HIGH (ref 11.3–14.5)
WBC: 8.3 10*3/uL (ref 3.9–10.0)

## 2008-03-09 ENCOUNTER — Ambulatory Visit: Payer: Self-pay | Admitting: Hematology & Oncology

## 2008-03-10 LAB — RETICULOCYTES (CHCC): Retic Ct Pct: 1.1 % (ref 0.4–3.1)

## 2008-03-10 LAB — CHCC SATELLITE - SMEAR

## 2008-03-10 LAB — CBC WITH DIFFERENTIAL (CANCER CENTER ONLY)
BASO#: 0 10*3/uL (ref 0.0–0.2)
BASO%: 0.4 % (ref 0.0–2.0)
HCT: 34 % — ABNORMAL LOW (ref 34.8–46.6)
HGB: 11.4 g/dL — ABNORMAL LOW (ref 11.6–15.9)
LYMPH#: 1.5 10*3/uL (ref 0.9–3.3)
MONO#: 0.5 10*3/uL (ref 0.1–0.9)
NEUT%: 63.1 % (ref 39.6–80.0)
WBC: 6.3 10*3/uL (ref 3.9–10.0)

## 2008-04-05 ENCOUNTER — Ambulatory Visit: Payer: Self-pay | Admitting: Hematology & Oncology

## 2008-04-11 LAB — CBC WITH DIFFERENTIAL/PLATELET
EOS%: 5 % (ref 0.0–7.0)
Eosinophils Absolute: 0.4 10*3/uL (ref 0.0–0.5)
MCV: 83.3 fL (ref 79.5–101.0)
MONO%: 8.1 % (ref 0.0–14.0)
NEUT#: 4.9 10*3/uL (ref 1.5–6.5)
RBC: 4 10*6/uL (ref 3.70–5.45)
RDW: 14.6 % — ABNORMAL HIGH (ref 11.2–14.5)

## 2008-05-05 LAB — CBC WITH DIFFERENTIAL/PLATELET
Basophils Absolute: 0 10*3/uL (ref 0.0–0.1)
EOS%: 5.4 % (ref 0.0–7.0)
MCH: 27.4 pg (ref 25.1–34.0)
MCHC: 32.8 g/dL (ref 31.5–36.0)
MCV: 83.7 fL (ref 79.5–101.0)
MONO%: 9.4 % (ref 0.0–14.0)
RBC: 4.34 10*6/uL (ref 3.70–5.45)
RDW: 15.6 % — ABNORMAL HIGH (ref 11.2–14.5)

## 2008-05-28 ENCOUNTER — Encounter: Admission: RE | Admit: 2008-05-28 | Discharge: 2008-05-28 | Payer: Self-pay | Admitting: Internal Medicine

## 2008-06-01 ENCOUNTER — Ambulatory Visit: Payer: Self-pay | Admitting: Hematology & Oncology

## 2008-06-02 LAB — CBC WITH DIFFERENTIAL (CANCER CENTER ONLY)
BASO%: 0.3 % (ref 0.0–2.0)
LYMPH#: 1.4 10*3/uL (ref 0.9–3.3)
MONO#: 0.4 10*3/uL (ref 0.1–0.9)
Platelets: 342 10*3/uL (ref 145–400)
RDW: 13.8 % (ref 10.5–14.6)
WBC: 5.1 10*3/uL (ref 3.9–10.0)

## 2008-06-02 LAB — CHCC SATELLITE - SMEAR

## 2008-06-30 ENCOUNTER — Ambulatory Visit: Payer: Self-pay | Admitting: Hematology & Oncology

## 2008-06-30 LAB — CBC WITH DIFFERENTIAL/PLATELET
BASO%: 0.4 % (ref 0.0–2.0)
Eosinophils Absolute: 0.3 10*3/uL (ref 0.0–0.5)
MCHC: 33 g/dL (ref 31.5–36.0)
MONO#: 0.5 10*3/uL (ref 0.1–0.9)
NEUT#: 5 10*3/uL (ref 1.5–6.5)
RBC: 4.08 10*6/uL (ref 3.70–5.45)
WBC: 7 10*3/uL (ref 3.9–10.3)
lymph#: 1.2 10*3/uL (ref 0.9–3.3)

## 2008-07-25 ENCOUNTER — Ambulatory Visit: Payer: Self-pay | Admitting: Internal Medicine

## 2008-08-11 ENCOUNTER — Encounter: Admission: RE | Admit: 2008-08-11 | Discharge: 2008-08-11 | Payer: Self-pay | Admitting: Internal Medicine

## 2008-08-18 ENCOUNTER — Encounter: Admission: RE | Admit: 2008-08-18 | Discharge: 2008-08-18 | Payer: Self-pay | Admitting: Internal Medicine

## 2008-08-24 ENCOUNTER — Telehealth: Payer: Self-pay | Admitting: Internal Medicine

## 2008-08-31 ENCOUNTER — Ambulatory Visit: Payer: Self-pay | Admitting: Hematology & Oncology

## 2008-09-01 LAB — CBC WITH DIFFERENTIAL (CANCER CENTER ONLY)
BASO%: 0.3 % (ref 0.0–2.0)
LYMPH%: 26.6 % (ref 14.0–48.0)
MCV: 80 fL — ABNORMAL LOW (ref 81–101)
MONO#: 0.3 10*3/uL (ref 0.1–0.9)
MONO%: 5.5 % (ref 0.0–13.0)
Platelets: 331 10*3/uL (ref 145–400)
RDW: 13.5 % (ref 10.5–14.6)
WBC: 5.4 10*3/uL (ref 3.9–10.0)

## 2008-09-01 LAB — FERRITIN: Ferritin: 182 ng/mL (ref 10–291)

## 2008-09-22 ENCOUNTER — Ambulatory Visit: Payer: Self-pay | Admitting: Internal Medicine

## 2008-09-29 ENCOUNTER — Ambulatory Visit: Payer: Self-pay | Admitting: Hematology & Oncology

## 2008-09-29 LAB — CBC WITH DIFFERENTIAL/PLATELET
Basophils Absolute: 0.1 10*3/uL (ref 0.0–0.1)
EOS%: 5 % (ref 0.0–7.0)
Eosinophils Absolute: 0.3 10*3/uL (ref 0.0–0.5)
HGB: 11 g/dL — ABNORMAL LOW (ref 11.6–15.9)
MCV: 80.8 fL (ref 79.5–101.0)
MONO%: 11.2 % (ref 0.0–14.0)
NEUT#: 3.1 10*3/uL (ref 1.5–6.5)
RBC: 4.22 10*6/uL (ref 3.70–5.45)
RDW: 16.3 % — ABNORMAL HIGH (ref 11.2–14.5)
lymph#: 1.5 10*3/uL (ref 0.9–3.3)

## 2008-10-26 ENCOUNTER — Ambulatory Visit: Payer: Self-pay | Admitting: Hematology & Oncology

## 2008-10-27 LAB — CBC WITH DIFFERENTIAL (CANCER CENTER ONLY)
BASO#: 0 10*3/uL (ref 0.0–0.2)
Eosinophils Absolute: 0.3 10*3/uL (ref 0.0–0.5)
HGB: 10.6 g/dL — ABNORMAL LOW (ref 11.6–15.9)
LYMPH%: 30.9 % (ref 14.0–48.0)
MCH: 25.8 pg — ABNORMAL LOW (ref 26.0–34.0)
MCV: 78 fL — ABNORMAL LOW (ref 81–101)
MONO%: 9.6 % (ref 0.0–13.0)
RBC: 4.09 10*6/uL (ref 3.70–5.32)

## 2008-10-27 LAB — FERRITIN: Ferritin: 129 ng/mL (ref 10–291)

## 2008-10-31 ENCOUNTER — Encounter: Admission: RE | Admit: 2008-10-31 | Discharge: 2008-10-31 | Payer: Self-pay | Admitting: Neurosurgery

## 2008-11-24 LAB — CBC WITH DIFFERENTIAL (CANCER CENTER ONLY)
BASO#: 0.1 10*3/uL (ref 0.0–0.2)
Eosinophils Absolute: 0.3 10*3/uL (ref 0.0–0.5)
HCT: 34.4 % — ABNORMAL LOW (ref 34.8–46.6)
HGB: 11.5 g/dL — ABNORMAL LOW (ref 11.6–15.9)
LYMPH#: 2.1 10*3/uL (ref 0.9–3.3)
MCH: 26.6 pg (ref 26.0–34.0)
MONO%: 6.2 % (ref 0.0–13.0)
NEUT#: 6.2 10*3/uL (ref 1.5–6.5)
NEUT%: 67.1 % (ref 39.6–80.0)
RBC: 4.34 10*6/uL (ref 3.70–5.32)

## 2008-11-24 LAB — CHCC SATELLITE - SMEAR

## 2008-11-26 LAB — IRON AND TIBC: %SAT: 10 % — ABNORMAL LOW (ref 20–55)

## 2008-11-26 LAB — RETICULOCYTES (CHCC)
RBC.: 4.53 MIL/uL (ref 3.87–5.11)
Retic Ct Pct: 0.7 % (ref 0.4–3.1)

## 2008-11-26 LAB — TRANSFERRIN RECEPTOR, SOLUABLE: Transferrin Receptor, Soluble: 34.4 nmol/L

## 2008-12-05 ENCOUNTER — Ambulatory Visit: Payer: Self-pay | Admitting: Hematology & Oncology

## 2008-12-22 LAB — CBC WITH DIFFERENTIAL/PLATELET
BASO%: 0.4 % (ref 0.0–2.0)
HCT: 35.8 % (ref 34.8–46.6)
MCHC: 32.5 g/dL (ref 31.5–36.0)
MONO#: 0.6 10*3/uL (ref 0.1–0.9)
NEUT%: 64.8 % (ref 38.4–76.8)
RBC: 4.27 10*6/uL (ref 3.70–5.45)
RDW: 18.5 % — ABNORMAL HIGH (ref 11.2–14.5)
WBC: 6.5 10*3/uL (ref 3.9–10.3)
lymph#: 1.4 10*3/uL (ref 0.9–3.3)

## 2008-12-22 LAB — FERRITIN: Ferritin: 404 ng/mL — ABNORMAL HIGH (ref 10–291)

## 2009-01-18 ENCOUNTER — Ambulatory Visit: Payer: Self-pay | Admitting: Hematology & Oncology

## 2009-01-19 LAB — CBC WITH DIFFERENTIAL (CANCER CENTER ONLY)
BASO#: 0 10*3/uL (ref 0.0–0.2)
EOS%: 5 % (ref 0.0–7.0)
HCT: 31.1 % — ABNORMAL LOW (ref 34.8–46.6)
HGB: 11 g/dL — ABNORMAL LOW (ref 11.6–15.9)
LYMPH#: 1.6 10*3/uL (ref 0.9–3.3)
MCHC: 35.3 g/dL (ref 32.0–36.0)
MONO#: 0.5 10*3/uL (ref 0.1–0.9)
NEUT#: 3.5 10*3/uL (ref 1.5–6.5)
RBC: 3.87 10*6/uL (ref 3.70–5.32)
WBC: 5.9 10*3/uL (ref 3.9–10.0)

## 2009-01-19 LAB — RETICULOCYTES (CHCC): RBC.: 4.04 MIL/uL (ref 3.87–5.11)

## 2009-01-19 LAB — COMPREHENSIVE METABOLIC PANEL
AST: 18 U/L (ref 0–37)
Albumin: 4.4 g/dL (ref 3.5–5.2)
Alkaline Phosphatase: 79 U/L (ref 39–117)
Calcium: 9 mg/dL (ref 8.4–10.5)
Chloride: 98 mEq/L (ref 96–112)
Glucose, Bld: 147 mg/dL — ABNORMAL HIGH (ref 70–99)
Potassium: 4.3 mEq/L (ref 3.5–5.3)
Sodium: 138 mEq/L (ref 135–145)
Total Protein: 6.6 g/dL (ref 6.0–8.3)

## 2009-01-23 ENCOUNTER — Ambulatory Visit: Payer: Self-pay | Admitting: Internal Medicine

## 2009-02-14 ENCOUNTER — Ambulatory Visit: Payer: Self-pay | Admitting: Hematology & Oncology

## 2009-02-16 LAB — CBC WITH DIFFERENTIAL/PLATELET
EOS%: 2.9 % (ref 0.0–7.0)
MCH: 28.6 pg (ref 25.1–34.0)
MCV: 86.9 fL (ref 79.5–101.0)
MONO%: 7.5 % (ref 0.0–14.0)
NEUT#: 4.8 10*3/uL (ref 1.5–6.5)
RBC: 4.22 10*6/uL (ref 3.70–5.45)
RDW: 15.6 % — ABNORMAL HIGH (ref 11.2–14.5)
lymph#: 1.1 10*3/uL (ref 0.9–3.3)

## 2009-03-09 ENCOUNTER — Ambulatory Visit (HOSPITAL_COMMUNITY)
Admission: RE | Admit: 2009-03-09 | Discharge: 2009-03-09 | Payer: Self-pay | Source: Home / Self Care | Admitting: Hematology & Oncology

## 2009-03-15 ENCOUNTER — Ambulatory Visit: Payer: Self-pay | Admitting: Hematology & Oncology

## 2009-03-16 LAB — CBC WITH DIFFERENTIAL (CANCER CENTER ONLY)
BASO%: 0.5 % (ref 0.0–2.0)
EOS%: 6 % (ref 0.0–7.0)
LYMPH#: 1.9 10*3/uL (ref 0.9–3.3)
MCH: 27.9 pg (ref 26.0–34.0)
MCHC: 33.5 g/dL (ref 32.0–36.0)
MONO%: 4.9 % (ref 0.0–13.0)
NEUT#: 5 10*3/uL (ref 1.5–6.5)
Platelets: 333 10*3/uL (ref 145–400)
RDW: 12.5 % (ref 10.5–14.6)

## 2009-03-16 LAB — RETICULOCYTES (CHCC)
RBC.: 4.29 MIL/uL (ref 3.87–5.11)
Retic Ct Pct: 0.7 % (ref 0.4–3.1)

## 2009-03-27 ENCOUNTER — Encounter: Admission: RE | Admit: 2009-03-27 | Discharge: 2009-03-27 | Payer: Self-pay | Admitting: Internal Medicine

## 2009-05-03 ENCOUNTER — Ambulatory Visit: Payer: Self-pay | Admitting: Hematology & Oncology

## 2009-05-04 ENCOUNTER — Encounter: Payer: Self-pay | Admitting: Internal Medicine

## 2009-05-04 LAB — CBC WITH DIFFERENTIAL (CANCER CENTER ONLY)
BASO#: 0 10*3/uL (ref 0.0–0.2)
Eosinophils Absolute: 0.3 10*3/uL (ref 0.0–0.5)
HGB: 11.4 g/dL — ABNORMAL LOW (ref 11.6–15.9)
LYMPH%: 26 % (ref 14.0–48.0)
MCH: 26.9 pg (ref 26.0–34.0)
MCV: 82 fL (ref 81–101)
MONO#: 0.5 10*3/uL (ref 0.1–0.9)
MONO%: 7.1 % (ref 0.0–13.0)
RBC: 4.23 10*6/uL (ref 3.70–5.32)
WBC: 6.4 10*3/uL (ref 3.9–10.0)

## 2009-05-04 LAB — FERRITIN: Ferritin: 199 ng/mL (ref 10–291)

## 2009-05-04 LAB — CHCC SATELLITE - SMEAR

## 2009-05-09 ENCOUNTER — Ambulatory Visit: Payer: Self-pay | Admitting: Vascular Surgery

## 2009-05-09 ENCOUNTER — Ambulatory Visit
Admission: RE | Admit: 2009-05-09 | Discharge: 2009-05-09 | Payer: Self-pay | Source: Home / Self Care | Admitting: Hematology & Oncology

## 2009-05-09 ENCOUNTER — Encounter: Payer: Self-pay | Admitting: Hematology & Oncology

## 2009-06-01 ENCOUNTER — Encounter: Payer: Self-pay | Admitting: Internal Medicine

## 2009-06-01 LAB — COMPREHENSIVE METABOLIC PANEL
AST: 19 U/L (ref 0–37)
Alkaline Phosphatase: 76 U/L (ref 39–117)
BUN: 16 mg/dL (ref 6–23)
Creatinine, Ser: 0.75 mg/dL (ref 0.40–1.20)
Glucose, Bld: 116 mg/dL — ABNORMAL HIGH (ref 70–99)
Potassium: 4.4 mEq/L (ref 3.5–5.3)
Total Bilirubin: 0.9 mg/dL (ref 0.3–1.2)

## 2009-06-01 LAB — CBC WITH DIFFERENTIAL (CANCER CENTER ONLY)
BASO#: 0 10*3/uL (ref 0.0–0.2)
EOS%: 4.6 % (ref 0.0–7.0)
HGB: 11.4 g/dL — ABNORMAL LOW (ref 11.6–15.9)
LYMPH%: 26.3 % (ref 14.0–48.0)
MCH: 28 pg (ref 26.0–34.0)
MCHC: 33.5 g/dL (ref 32.0–36.0)
MCV: 84 fL (ref 81–101)
MONO%: 6.8 % (ref 0.0–13.0)
NEUT#: 3.8 10*3/uL (ref 1.5–6.5)
NEUT%: 61.8 % (ref 39.6–80.0)

## 2009-07-11 ENCOUNTER — Ambulatory Visit: Payer: Self-pay | Admitting: Hematology & Oncology

## 2009-07-13 ENCOUNTER — Encounter: Payer: Self-pay | Admitting: Internal Medicine

## 2009-07-13 LAB — CBC WITH DIFFERENTIAL (CANCER CENTER ONLY)
BASO#: 0 10*3/uL (ref 0.0–0.2)
BASO%: 0.4 % (ref 0.0–2.0)
HCT: 33.7 % — ABNORMAL LOW (ref 34.8–46.6)
HGB: 11.4 g/dL — ABNORMAL LOW (ref 11.6–15.9)
LYMPH#: 1.6 10*3/uL (ref 0.9–3.3)
MONO#: 0.4 10*3/uL (ref 0.1–0.9)
NEUT#: 3.5 10*3/uL (ref 1.5–6.5)
NEUT%: 60.6 % (ref 39.6–80.0)
WBC: 5.8 10*3/uL (ref 3.9–10.0)

## 2009-07-13 LAB — CHCC SATELLITE - SMEAR

## 2009-07-13 LAB — RETICULOCYTES (CHCC): ABS Retic: 41 10*3/uL (ref 19.0–186.0)

## 2009-07-24 ENCOUNTER — Telehealth: Payer: Self-pay | Admitting: Internal Medicine

## 2009-07-24 ENCOUNTER — Ambulatory Visit: Payer: Self-pay | Admitting: Internal Medicine

## 2009-08-29 ENCOUNTER — Ambulatory Visit: Payer: Self-pay | Admitting: Internal Medicine

## 2009-09-19 ENCOUNTER — Ambulatory Visit: Payer: Self-pay | Admitting: Hematology & Oncology

## 2009-09-21 ENCOUNTER — Encounter: Payer: Self-pay | Admitting: Internal Medicine

## 2009-09-21 LAB — CBC WITH DIFFERENTIAL (CANCER CENTER ONLY)
BASO#: 0 10*3/uL (ref 0.0–0.2)
EOS%: 5.1 % (ref 0.0–7.0)
HCT: 32 % — ABNORMAL LOW (ref 34.8–46.6)
HGB: 10.8 g/dL — ABNORMAL LOW (ref 11.6–15.9)
LYMPH%: 29.1 % (ref 14.0–48.0)
MCH: 27.5 pg (ref 26.0–34.0)
MCHC: 33.7 g/dL (ref 32.0–36.0)
MCV: 81 fL (ref 81–101)
MONO%: 7.8 % (ref 0.0–13.0)
NEUT%: 57.7 % (ref 39.6–80.0)

## 2009-09-22 LAB — VITAMIN D 25 HYDROXY (VIT D DEFICIENCY, FRACTURES): Vit D, 25-Hydroxy: 20 ng/mL — ABNORMAL LOW (ref 30–89)

## 2009-10-06 ENCOUNTER — Telehealth (INDEPENDENT_AMBULATORY_CARE_PROVIDER_SITE_OTHER): Payer: Self-pay | Admitting: *Deleted

## 2009-10-09 ENCOUNTER — Ambulatory Visit: Payer: Self-pay | Admitting: Internal Medicine

## 2009-10-30 ENCOUNTER — Encounter: Admission: RE | Admit: 2009-10-30 | Discharge: 2009-10-30 | Payer: Self-pay | Admitting: Internal Medicine

## 2009-11-08 ENCOUNTER — Telehealth: Payer: Self-pay | Admitting: Internal Medicine

## 2009-11-14 ENCOUNTER — Ambulatory Visit: Payer: Self-pay | Admitting: Hematology & Oncology

## 2009-11-16 ENCOUNTER — Encounter: Payer: Self-pay | Admitting: Internal Medicine

## 2009-11-16 LAB — CBC WITH DIFFERENTIAL (CANCER CENTER ONLY)
BASO%: 0.4 % (ref 0.0–2.0)
LYMPH%: 29 % (ref 14.0–48.0)
MCV: 82 fL (ref 81–101)
MONO#: 0.5 10*3/uL (ref 0.1–0.9)
MONO%: 7.8 % (ref 0.0–13.0)
Platelets: 335 10*3/uL (ref 145–400)
RDW: 13.4 % (ref 10.5–14.6)
WBC: 6.1 10*3/uL (ref 3.9–10.0)

## 2009-11-16 LAB — RETICULOCYTES (CHCC): ABS Retic: 37.7 10*3/uL (ref 19.0–186.0)

## 2009-11-16 LAB — CHCC SATELLITE - SMEAR

## 2010-01-02 ENCOUNTER — Ambulatory Visit: Payer: Self-pay | Admitting: Hematology & Oncology

## 2010-01-04 ENCOUNTER — Encounter: Payer: Self-pay | Admitting: Internal Medicine

## 2010-01-04 LAB — CBC WITH DIFFERENTIAL (CANCER CENTER ONLY)
BASO#: 0 10*3/uL (ref 0.0–0.2)
EOS%: 5.9 % (ref 0.0–7.0)
Eosinophils Absolute: 0.4 10*3/uL (ref 0.0–0.5)
HCT: 33.6 % — ABNORMAL LOW (ref 34.8–46.6)
HGB: 11.1 g/dL — ABNORMAL LOW (ref 11.6–15.9)
LYMPH#: 1.7 10*3/uL (ref 0.9–3.3)
MCH: 26.8 pg (ref 26.0–34.0)
MCHC: 33 g/dL (ref 32.0–36.0)
NEUT#: 3.7 10*3/uL (ref 1.5–6.5)
NEUT%: 59.7 % (ref 39.6–80.0)
RBC: 4.13 10*6/uL (ref 3.70–5.32)

## 2010-01-04 LAB — FERRITIN: Ferritin: 150 ng/mL (ref 10–291)

## 2010-01-04 LAB — RETICULOCYTES (CHCC): ABS Retic: 44.2 10*3/uL (ref 19.0–186.0)

## 2010-02-20 ENCOUNTER — Ambulatory Visit (HOSPITAL_BASED_OUTPATIENT_CLINIC_OR_DEPARTMENT_OTHER): Payer: Medicare Other | Admitting: Hematology & Oncology

## 2010-02-22 ENCOUNTER — Encounter: Payer: Self-pay | Admitting: Internal Medicine

## 2010-02-22 LAB — CBC WITH DIFFERENTIAL (CANCER CENTER ONLY)
BASO#: 0 10*3/uL (ref 0.0–0.2)
BASO%: 0.4 % (ref 0.0–2.0)
EOS%: 4.6 % (ref 0.0–7.0)
Eosinophils Absolute: 0.3 10*3/uL (ref 0.0–0.5)
HCT: 34.1 % — ABNORMAL LOW (ref 34.8–46.6)
HGB: 11.3 g/dL — ABNORMAL LOW (ref 11.6–15.9)
LYMPH#: 1.2 10*3/uL (ref 0.9–3.3)
LYMPH%: 17 % (ref 14.0–48.0)
MCH: 27.2 pg (ref 26.0–34.0)
MCHC: 33.1 g/dL (ref 32.0–36.0)
MCV: 82 fL (ref 81–101)
MONO#: 0.5 10*3/uL (ref 0.1–0.9)
MONO%: 6.8 % (ref 0.0–13.0)
NEUT#: 5.2 10*3/uL (ref 1.5–6.5)
NEUT%: 71.2 % (ref 39.6–80.0)
Platelets: 353 10*3/uL (ref 145–400)
RBC: 4.16 10*6/uL (ref 3.70–5.32)
RDW: 12.3 % (ref 10.5–14.6)
WBC: 7.3 10*3/uL (ref 3.9–10.0)

## 2010-02-22 LAB — RETICULOCYTES (CHCC)
ABS Retic: 55.8 10*3/uL (ref 19.0–186.0)
RBC.: 4.29 MIL/uL (ref 3.87–5.11)
Retic Ct Pct: 1.3 % (ref 0.4–3.1)

## 2010-02-22 LAB — IRON AND TIBC
%SAT: 12 % — ABNORMAL LOW (ref 20–55)
Iron: 40 ug/dL — ABNORMAL LOW (ref 42–145)
TIBC: 324 ug/dL (ref 250–470)
UIBC: 284 ug/dL

## 2010-02-22 LAB — FERRITIN: Ferritin: 128 ng/mL (ref 10–291)

## 2010-02-26 ENCOUNTER — Ambulatory Visit
Admission: RE | Admit: 2010-02-26 | Discharge: 2010-02-26 | Payer: Self-pay | Source: Home / Self Care | Attending: Internal Medicine | Admitting: Internal Medicine

## 2010-03-05 ENCOUNTER — Telehealth (INDEPENDENT_AMBULATORY_CARE_PROVIDER_SITE_OTHER): Payer: Self-pay | Admitting: *Deleted

## 2010-03-05 ENCOUNTER — Ambulatory Visit
Admission: RE | Admit: 2010-03-05 | Discharge: 2010-03-05 | Payer: Self-pay | Source: Home / Self Care | Attending: Internal Medicine | Admitting: Internal Medicine

## 2010-03-05 ENCOUNTER — Encounter: Payer: Self-pay | Admitting: Internal Medicine

## 2010-03-11 ENCOUNTER — Encounter: Payer: Self-pay | Admitting: Hematology & Oncology

## 2010-03-12 ENCOUNTER — Telehealth (INDEPENDENT_AMBULATORY_CARE_PROVIDER_SITE_OTHER): Payer: Self-pay | Admitting: *Deleted

## 2010-03-20 NOTE — Assessment & Plan Note (Signed)
Summary: 6 months/apc   Primary Provider/Referring Provider:  Trula Slade  CC:  6 month follow up pt c/o increase sob with exertion x 1 month not taking symbicort states it did not help her and requests rescue inhler.  History of Present Illness: 09/22/08- COPD, allergic rhinitis No acute issue but has to avoid outdoor heat as much as possible.She cancelled PFT after last visit when she got acutely ill with fever and "low oxygen". Dr Valentina Lucks did CXR- clear. Illness slow to improve. Stil feels congestion at times. No longer productive cough. Feels she can't sleep without oxygen. She feels PFT is too hard on her. Remembers Spiriva not helpful. She asked if nasal steroid would bother her macular degeneration- referred to her eye doctor. Office spirometry today- moderate restriction of exhaled voume- partly weak effort and airtrapping. Mild to moderate obstruction.   January 23, 2009- COPD, allergic rhinitis Had flu shot. Breathing not changed. She tried several months of Advair- would seem to help only a few hours. Occasional wheeze, feels  some congestion. Spiriva didn't help.nasal burning and watery drainage botrher worse this year. Fluticasone nasal spray seems to keep some irritation down, but she still gets burning and itching. Uses oxygen at night. Dr Myna Hidalgo treats for chronic anemia with injections.   July 24, 2009- COPD, allergic rhinitis Increased dyspnea over past month. Little cough, no wheeze. Mainly dyspnea with exertion outside. Denies increased chest pain beyond known angina with very occasional NTG. or palpitation. She stopped Symbicort because it didn't seem to help. No longer has a rescue inhaler- never found inhalers helpful. Some dyspnea at night, props up and uses oxygen at 2 liters. She admits getting frightened by her dyspnea, but doesn't want portable oxygen because she doesn't see well and isn't strong enough to carry it. Treated for anemia and Hgb better, around 11 now at Dr  Gustavo Lah office. Evaluated by Dr Katrinka Blazing for carotid artery disease. 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  Need to document smoking hx   Current Medications (verified): 1)  Norvasc 5 Mg  Tabs (Amlodipine Besylate) .... One By Mouth Qd 2)  Nadolol 20 Mg  Tabs (Nadolol) 3)  Cozaar 50 Mg  Tabs (Losartan Potassium) .... One By Mouth Once Daily 4)  Nexium 40 Mg  Cpdr (Esomeprazole Magnesium) .[name].. 1 By Mouth Two Times A Day 5)  Pravachol 80 Mg  Tabs (Pravastatin Sodium) .... One By Mouth Qd 6)  Xanax 0.25 Mg  Tabs (Alprazolam) .... Prn 7)  Furosemide 40 Mg  Tabs (Furosemide) .... Prn 8)  Oxygen 2 L/m Sleep Advanced 9)  Fluticasone Propionate 50 Mcg/act Susp (Fluticasone Propionate) .[name].. 1-2 Sprays Each Nostril Daily 10)  Aspir-Low 81 Mg Tbec (Aspirin) .[name].. 1 Once Daily 11)  Glucosamine 500 Mg Caps (Glucosamine Sulfate) .[name].. 1 Two Times A Day 12)  Calcium 500 Mg Tabs (Calcium Carbonate) .[name].. 1 Once Daily 13)  Ocuvite  Tabs (Multiple Vitamins-Minerals) .... Take 1 By Mouth Once Daily 14)  Fish Oil 1200 Mg Caps (Omega-3 Fatty Acids) .... Take 1 By Mouth Once Daily 15)  Multivitamins  Tabs (Multiple Vitamin) .... Take 1 By Mouth Once Daily 16)  Tramadol Hcl 50 Mg Tabs (Tramadol Hcl) .[name].. 1 Once Daily 17)  Nitro-Dur 0.4 Mg/hr Pt24 (Nitroglycerin) .[name].. 1 As Needed  Allergies (verified): No Known Drug Allergies  Past History:  Past Medical History: Last updated: 06/15/2007 COPD (ICD-496) ALLERGIC RHINITIS (ICD-477.9) DYSPNEA (ICD-786.05) GERD (ICD-530.81) ANEMIA, IRON DEFICIENCY, CHRONIC (ICD-280.9) CAD (ICD-414.00) EUSTACHIAN TUBE DYSFUNCTION (  ICD-381.81) OBESITY (ICD-278.00)  Past Surgical History: Last updated: 06/15/2007 CABG  Family History: Last updated: 07/25/2008 Mother Heart Disease Father Heart Disease Brother Colon Cancer Sister Breast Cancer  Social History: Last updated: 07/25/2008 Retired Widowed with 2 children  Risk Factors: Smoking Status: never  (12/13/2006)  Review of Systems      See HPI       The patient complains of shortness of breath with activity and chest pain.  The patient denies shortness of breath at rest, productive cough, non-productive cough, coughing up blood, irregular heartbeats, acid heartburn, indigestion, loss of appetite, weight change, abdominal pain, difficulty swallowing, sore throat, tooth/dental problems, headaches, nasal congestion/difficulty breathing through nose, and sneezing.    Vital Signs:  Patient profile:   77 year old female Height:      61 inches Weight:      186 pounds BMI:     35.27 O2 Sat:      89 % on Room air Pulse rate:   60 / minute BP sitting:   138 / 68  (left arm)  Vitals Entered By: Renold Genta RCP, LPN (July 24, 1608 10:17 AM)  O2 Sat at Rest %:  89% O2 Flow:  Room air  O2 Sat Comments 02 sats 89% with ambulation and increased to 94% after rest CC: 6 month follow up pt c/o increase sob with exertion x 1 month not taking symbicort states it did not help her, requests rescue inhler Comments Medications reviewed with patient Renold Genta RCP, LPN  July 25, 9602 10:23 AM    Physical Exam  Additional Exam:  General: A/Ox3; pleasant and cooperative, NAD, overweight SKIN: no rash, lesions NODES: no lymphadenopathy HEENT: [place]/AT, EOM- WNL, Conjuctivae- clear, PERRLA, TM-WNL, Nose- mucus bridging and drying without erosion., Throat- clear and wnl NECK: Supple w/ fair ROM, JVD- none, normal carotid impulses w/o bruits Thyroid CHEST: Clear to P&A, decreased, without cough or wheeze HEART: RRR, no m/g/r heard ABDOMEN: Soft and nl;  VWU:JWJX, nl pulses, no edema  NEURO: Grossly intact to observation      Impression & Recommendations:  Problem # 1:  COPD (ICD-496) Hypoxic respiratory failure. Borderline here now, sat 89% at rest.  I don't see much expectation that medications would substantially impove lung function. We will have her do a 6 MWT to clarify ambulatory  oxygen need. CXR  Problem # 2:  ALLERGIC RHINITIS (ICD-477.9)  Controlled, and now past peak pollen season. Her updated medication list for this problem includes:    Fluticasone Propionate 50 Mcg/act Susp (Fluticasone propionate) .[name]... 1-2 sprays each nostril daily  Medications Added to Medication List This Visit: 1)  Tramadol Hcl 50 Mg Tabs (Tramadol hcl) .[name].. 1 once daily 2)  Nitro-dur 0.4 Mg/hr Pt24 (Nitroglycerin) .[name].. 1 as needed  Other Orders: Est. Patient Level IV (99214) T-2 View CXR (71020TC)  Patient Instructions: 1)  Please schedule a follow-up appointment in 1 month. 2)  Schedule 6 MWT 3)  Sample rescue inhaler- Xopenex-  4)  2 puffs up to 4 times daily as needed. 5)  A chest x-ray has been recommended.  Your imaging study may require preauthorization.

## 2010-03-20 NOTE — Progress Notes (Signed)
Summary: poss fluid in lungs - appt w/ TP 8.22.11  Phone Note Call from Patient Call back at Home Phone [phone]   Caller: Patient Call For: young Summary of Call: Pt c/o swelling in feet and ankles, states she went to her primary on monday and he suspect she had fluid in her lungs, says the swelling has subsided in her feet and ankles, pls advise. Initial call taken by: Darletta Moll,  October 06, 2009 2:33 PM  Follow-up for Phone Call        called spoke with patient who states that she has been having fluid retention x7days.  she went to see her PCP on monday and was told that she may have fluid in her lungs; was told to increase her lasix 40mg  to 2tabs daily x3days, then down to 1 daily - edema has improved.  pt states that her breathing has not been acute and declined appt this afternoon.  CDY booked all next week.  appt sched w/ TP 8.22.11 @ 1100.  pt aware to go to the ED/UC if her breathing worsens over the weekend. Follow-up by: Boone Master CNA/MA,  October 06, 2009 3:02 PM

## 2010-03-20 NOTE — Letter (Signed)
[facility]   Imported By: Lester Port  07/06/2009 08:16:47  _____________________________________________________________________  External Attachment:    Type:   Image     Comment:   External Document

## 2010-03-20 NOTE — Progress Notes (Signed)
Summary: fluid pills  Phone Note Call from Patient Call back at Home Phone [phone]   Caller: Patient Call For: young Summary of Call: pt was just seen br dr young. pt states that she needs rx for fluid tabs (says she took this yrs ago and discussed this with nurse today) as her ankles are swelling. call pt later today (around 2pm) when she will be home). [facility] [address] Initial call taken by: Tivis Ringer, CNA,  July 24, 2009 11:19 AM  Follow-up for Phone Call        Pt request rx for fluid pill be sent to pharmacy. She states she discussed with CY at appt today but did not get RX. SHe didnt remember name of med but states she took it in the past. Looks lik she ahs been on furosemide 40mg  inthe past. Pelase advise if ok to send in rx. thanks.Carron Curie CMA  July 24, 2009 11:56 AM  per Zack Seal ok to give what pt was on in the past. According to med list pt was on furosemide 40mg  as needed. I have sent in refill. pt aware.Carron Curie CMA  July 24, 2009 4:53 PM     New/Updated Medications: FUROSEMIDE 40 MG TABS (FUROSEMIDE) Once daily as needed Prescriptions: FUROSEMIDE 40 MG TABS (FUROSEMIDE) Once daily as needed  #30 x 2   Entered by:   Carron Curie CMA   Authorized by:   Waymon Budge MD   Signed by:   Carron Curie CMA on 07/24/2009   Method used:   [facility]        [facility] [address] [phone]* (retail)       [address]       [place], [place]  [address]       Ph: [phone] or [phone]       Fax: [phone]   RxID:   [id number]

## 2010-03-20 NOTE — Assessment & Plan Note (Signed)
Summary: rov after smw ///kp   Primary Provider/Referring Provider:  Trula Slade  CC:   Follow up with 6 minute walk and pt c/o sob with exertion.  History of Present Illness: January 23, 2009- COPD, allergic rhinitis Had flu shot. Breathing not changed. She tried several months of Advair- would seem to help only a few hours. Occasional wheeze, feels  some congestion. Spiriva didn't help.nasal burning and watery drainage botrher worse this year. Fluticasone nasal spray seems to keep some irritation down, but she still gets burning and itching. Uses oxygen at night. Dr Myna Hidalgo treats for chronic anemia with injections.   July 24, 2009- COPD, allergic rhinitis Increased dyspnea over past month. Little cough, no wheeze. Mainly dyspnea with exertion outside. Denies increased chest pain beyond known angina with very occasional NTG. or palpitation. She stopped Symbicort because it didn't seem to help. No longer has a rescue inhaler- never found inhalers helpful. Some dyspnea at night, props up and uses oxygen at 2 liters. She admits getting frightened by her dyspnea, but doesn't want portable oxygen because she doesn't see well and isn't strong enough to carry it. Treated for anemia and Hgb better, around 11 now at Dr Gustavo Lah office. Evaluated by Dr Katrinka Blazing for carotid artery disease. 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  Need to document smoking hx  August 29, 2009- COPD, Allergic rhinitis She was doing ok til she got here. Six minute walk test gave her a headache. Rescue inhaler Proair helps brefly. Long acting meds like Advair and Symbicort never did. Only occasional cough or sneezxe. CXR- minor left atelectasis; cardiomegaly with old cardiac surgery changes. NAD 91%, 87%, 95%, only 192 feet- got dizzy and stopped before walking 6 minutes at easy pace.   Preventive Screening-Counseling & Management  Alcohol-Tobacco     Smoking Status: never  Allergies: No Known Drug Allergies  Past  History:  Past Medical History: Last updated: 06/15/2007 COPD (ICD-496) ALLERGIC RHINITIS (ICD-477.9) DYSPNEA (ICD-786.05) GERD (ICD-530.81) ANEMIA, IRON DEFICIENCY, CHRONIC (ICD-280.9) CAD (ICD-414.00) EUSTACHIAN TUBE DYSFUNCTION (ICD-381.81) OBESITY (ICD-278.00)  Past Surgical History: Last updated: 06/15/2007 CABG  Family History: Last updated: 07/25/2008 Mother Heart Disease Father Heart Disease Brother Colon Cancer Sister Breast Cancer  Social History: Last updated: 07/25/2008 Retired Widowed with 2 children  Risk Factors: Smoking Status: never (08/29/2009)  Review of Systems      See HPI       The patient complains of shortness of breath with activity.  The patient denies shortness of breath at rest, productive cough, non-productive cough, coughing up blood, chest pain, irregular heartbeats, acid heartburn, indigestion, loss of appetite, weight change, abdominal pain, difficulty swallowing, sore throat, tooth/dental problems, headaches, nasal congestion/difficulty breathing through nose, and sneezing.    Vital Signs:  Patient profile:   77 year old female Height:      61 inches Weight:      186 pounds O2 Sat:      90 % on Room air Pulse rate:   65 / minute BP sitting:   134 / 58  (left arm)  Vitals Entered By: Renold Genta RCP, LPN (August 29, 2009 11:11 AM)  O2 Sat at Rest %:  90% O2 Flow:  Room air CC:  Follow up with 6 minute walk, pt c/o sob with exertion Comments Medications reviewed with patient Renold Genta RCP, LPN  August 29, 2009 11:12 AM    Physical Exam  Additional Exam:  General: A/Ox3; pleasant and cooperative, NAD, overweight SKIN: no rash,  lesions NODES: no lymphadenopathy HEENT: [place]/AT, EOM- WNL, Conjuctivae- clear, PERRLA, TM-WNL, Nose- mucus, Throat- clear and wnl, Mallampati  III NECK: Supple w/ fair ROM, JVD- none, normal carotid impulses w/o bruits Thyroid CHEST: Clear to P&A, decreased, without cough or wheeze HEART: RRR, no  m/g/r heard ABDOMEN: Soft and nl;  ZOX:WRUE, nl pulses, no edema  NEURO: Grossly intact to observation      Impression & Recommendations:  Problem # 1:  COPD (ICD-496) I think her cardiac disease is the main limitiation, but she agrees to get formal PFT while we look for problems we can correct.  Dr Myna Hidalgo had treated for her anemia and continues to watch this. I do not hear a murmur, but she may have cardiomyopathy to explain her exercise limitation.   Problem # 2:  CAD (ICD-414.00) As noted above- depending on how much we attribute to deconditioning and obesity, there may be a significant organic exercise limitation. Her updated medication list for this problem includes:    Norvasc 5 Mg Tabs (Amlodipine besylate) ..... One by mouth qd    Nadolol 20 Mg Tabs (Nadolol)    Cozaar 50 Mg Tabs (Losartan potassium) ..... One by mouth once daily    Aspir-low 81 Mg Tbec (Aspirin) .[name]... 1 once daily    Nitro-dur 0.4 Mg/hr Pt24 (Nitroglycerin) .[name]... 1 as needed    Furosemide 40 Mg Tabs (Furosemide) ..... Once daily as needed  Medications Added to Medication List This Visit: 1)  Proair Hfa 108 (90 Base) Mcg/act Aers (Albuterol sulfate) .... 2 puffs four times a day as needed rescue  Other Orders: Est. Patient Level IV (45409) Prescription Created Electronically [id number])  Patient Instructions: 1)  Please schedule a follow-up appointment in 4 months. 2)  Schedule PFT Prescriptions: PROAIR HFA 108 (90 BASE) MCG/ACT AERS (ALBUTEROL SULFATE) 2 puffs four times a day as needed rescue  #1 x prn   Entered and Authorized by:   Waymon Budge MD   Signed by:   Waymon Budge MD on 08/29/2009   Method used:   [facility] [address] [phone]* (retail)       [address]       [place], [place]  [address]       Ph: [phone] or [phone]       Fax: [phone]   RxID:   [id number]

## 2010-03-20 NOTE — Progress Notes (Signed)
Summary: rx request  Phone Note Call from Patient Call back at Home Phone [phone]   Caller: Patient Call For: young Summary of Call: pt was given a sample of proair hfa. [facility] [address]. ok to leave msg on home ph# Initial call taken by: Tivis Ringer, CNA,  November 08, 2009 2:23 PM  Follow-up for Phone Call        rx for proair has been faxed to pts pharmacy per pts request.  called and lmomt make pt aware Randell Loop CMA  November 08, 2009 4:06 PM     Prescriptions: PROAIR HFA 108 (90 BASE) MCG/ACT AERS (ALBUTEROL SULFATE) 2 puffs four times a day as needed rescue  #1 x 5   Entered by:   Randell Loop CMA   Authorized by:   Waymon Budge MD   Signed by:   Randell Loop CMA on 11/08/2009   Method used:   [facility]        [facility] [address] [phone]* (retail)       [address]       [place], [place]  [address]       Ph: [phone] or [phone]       Fax: [phone]   RxID:   [id number]

## 2010-03-20 NOTE — Letter (Signed)
[facility]   Imported By: Sherian Rein 05/29/2009 11:53:55  _____________________________________________________________________  External Attachment:    Type:   Image     Comment:   External Document

## 2010-03-20 NOTE — Letter (Signed)
[facility]   Imported By: Lester [place] 10/12/2009 08:29:34  _____________________________________________________________________  External Attachment:    Type:   Image     Comment:   External Document

## 2010-03-20 NOTE — Letter (Signed)
Summary: [facility]  [facility]   Imported By: Lester [place] 12/19/2009 08:32:04  _____________________________________________________________________  External Attachment:    Type:   Image     Comment:   External Document

## 2010-03-20 NOTE — Assessment & Plan Note (Signed)
Summary: Acute NP office visit - dyspnea   Primary Shahla Betsill/Referring Karli Wickizer:  Ellen [name]  CC:  fluid retention x1.5weeks and saw PCP 1 week ago and was told to increase lasix x3days.[name]  History of Present Illness: January 23, 2009- COPD, allergic rhinitis Had flu shot. Breathing not changed. She tried several months of Advair- would seem to help only a few hours. Occasional wheeze, feels  some congestion. Spiriva didn't help.nasal burning and watery drainage botrher worse this year. Fluticasone nasal spray seems to keep some irritation down, but she still gets burning and itching. Uses oxygen at night. Dr Myna Hidalgo treats for chronic anemia with injections.   July 24, 2009- COPD, allergic rhinitis Increased dyspnea over past month. Little cough, no wheeze. Mainly dyspnea with exertion outside. Denies increased chest pain beyond known angina with very occasional NTG. or palpitation. She stopped Symbicort because it didn't seem to help. No longer has a rescue inhaler- never found inhalers helpful. Some dyspnea at night, props up and uses oxygen at 2 liters. She admits getting frightened by her dyspnea, but doesn't want portable oxygen because she doesn't see well and isn't strong enough to carry it. Treated for anemia and Hgb better, around 11 now at Dr Gustavo Lah office. Evaluated by Dr Katrinka Blazing for carotid artery disease. 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  Need to document smoking hx  August 29, 2009- COPD, Allergic rhinitis She was doing ok til she got here. Six minute walk test gave her a headache. Rescue inhaler Proair helps brefly. Long acting meds like Advair and Symbicort never did. Only occasional cough or sneezxe. CXR- minor left atelectasis; cardiomegaly with old cardiac surgery changes. NAD 91%, 87%, 95%, only 192 feet- got dizzy and stopped before walking 6 minutes at easy pace.  October 09, 2009--Presents for an acute office visit. Pt was seen by PCP 10 days ago, for ankle  edema. Was tx w/ lasix which helped. She comes today to make sure she does not have fluid in her lungs. She says she feels better. Her breathing is the same with no increased dyspnea, cough or wheezing. Denies chest pain,  orthopnea, hemoptysis, fever, n/v/d. Says she noticed her ankles were more swollen in the afternoons. Took Lasix  x 3 days, on the third day the swelling went away. Ankles are better w/ no return of edema.     Medications Prior to Update: 1)  Norvasc 5 Mg  Tabs (Amlodipine Besylate) .... One By Mouth Qd 2)  Nadolol 20 Mg  Tabs (Nadolol) 3)  Cozaar 50 Mg  Tabs (Losartan Potassium) .... One By Mouth Once Daily 4)  Nexium 40 Mg  Cpdr (Esomeprazole Magnesium) .[name].. 1 By Mouth Two Times A Day 5)  Pravachol 80 Mg  Tabs (Pravastatin Sodium) .... One By Mouth Qd 6)  Xanax 0.25 Mg  Tabs (Alprazolam) .... Prn 7)  Oxygen 2 L/m Sleep Advanced 8)  Aspir-Low 81 Mg Tbec (Aspirin) .[name].. 1 Once Daily 9)  Glucosamine 500 Mg Caps (Glucosamine Sulfate) .[name].. 1 Two Times A Day 10)  Calcium 500 Mg Tabs (Calcium Carbonate) .[name].. 1 Once Daily 11)  Ocuvite  Tabs (Multiple Vitamins-Minerals) .... Take 1 By Mouth Once Daily 12)  Fish Oil 1200 Mg Caps (Omega-3 Fatty Acids) .... Take 1 By Mouth Once Daily 13)  Multivitamins  Tabs (Multiple Vitamin) .... Take 1 By Mouth Once Daily 14)  Tramadol Hcl 50 Mg Tabs (Tramadol Hcl) .[name].. 1 Once Daily 15)  Nitro-Dur 0.4 Mg/hr Pt24 (Nitroglycerin) .Marland KitchenMarland KitchenMarland Kitchen  1 As Needed 16)  Furosemide 40 Mg Tabs (Furosemide) .... Once Daily As Needed 17)  Proair Hfa 108 (90 Base) Mcg/act Aers (Albuterol Sulfate) .... 2 Puffs Four Times A Day As Needed Rescue  Current Medications (verified): 1)  Norvasc 10 Mg Tabs (Amlodipine Besylate) .... Take 1 Tablet By Mouth Once A Day 2)  Nadolol 80 Mg Tabs (Nadolol) .... Take 1 Tablet By Mouth Once A Day 3)  Cozaar 50 Mg  Tabs (Losartan Potassium) .... One By Mouth Once Daily 4)  Nexium 40 Mg  Cpdr (Esomeprazole Magnesium) .[name].. 1 By Mouth Two Times  A Day 5)  Pravachol 80 Mg  Tabs (Pravastatin Sodium) .... One By Mouth Qd 6)  Xanax 0.25 Mg  Tabs (Alprazolam) .... Take 1 Tablet By Mouth Once A Day As Needed 7)  Oxygen 2 L/m .... Wear At Bedtime - Ahc 8)  Aspir-Low 81 Mg Tbec (Aspirin) .[name].. 1 Once Daily 9)  Glucosamine-Chondroitin 500-400 Mg Caps (Glucosamine-Chondroitin) .... Take 1 Capsule By Mouth Two Times A Day 10)  Calcium 500 Mg Tabs (Calcium Carbonate) .[name].. 1 Once Daily 11)  Preservision Areds  Caps (Multiple Vitamins-Minerals) .... Take 1 Capsule By Mouth Once A Day 12)  Fish Oil 1200 Mg Caps (Omega-3 Fatty Acids) .... Take 1 By Mouth Once Daily 13)  Multivitamins  Tabs (Multiple Vitamin) .... Take 1 By Mouth Once Daily 14)  Tramadol Hcl 50 Mg Tabs (Tramadol Hcl) .[name].. 1 Once Daily 15)  Nitro-Dur 0.4 Mg/hr Pt24 (Nitroglycerin) .[name].. 1 As Needed 16)  Furosemide 40 Mg Tabs (Furosemide) .... Once Daily As Needed 17)  Proair Hfa 108 (90 Base) Mcg/act Aers (Albuterol Sulfate) .... 2 Puffs Four Times A Day As Needed Rescue 18)  Fexofenadine Hcl 60 Mg Tabs (Fexofenadine Hcl) .... Take 1 Tablet By Mouth Once A Day 19)  Mobic 7.5 Mg Tabs (Meloxicam) .... Take 1 Tablet By Mouth Once A Day As Needed Arthritis 20)  Saline Nasal Spray 0.65 % Soln (Saline) .... 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 Is Patient Diabetic? No Comments Medications reviewed with patient Daytime contact number verified with patient. Boone Master CNA/MA  October 09, 2009 11:42 AM    Physical Exam  Additional Exam:  General: A/Ox3; pleasant and cooperative, NAD, overweight SKIN:  no rash, lesions NODES: no lymphadenopathy HEENT: [place]/AT, EOM- WNL, Conjuctivae- clear, PERRLA, TM-WNL, Nose- mucus, Throat- clear and wnl, Mallampati  III NECK: Supple w/ fair ROM, JVD- none, normal carotid impulses w/o bruits Thyroid CHEST: Clear to P&A, decreased, without cough or wheeze HEART: RRR, no m/g/r heard ABDOMEN: Soft and nl;  ZOX:WRUE, nl pulses, none-tr  edema  NEURO: Grossly intact to observation      Impression & Recommendations:  Problem # 1:  COPD (ICD-496) CXR today with chronic changes no edema/effusion on xray reassurance provided. advised to stay on present regimen.  follow up as scheduled and as needed  Medications Added to Medication List This Visit: 1)  Norvasc 10 Mg Tabs (Amlodipine besylate) .... Take 1 tablet by mouth once a day 2)  Nadolol 80 Mg Tabs (Nadolol) .... Take 1 tablet by mouth once a day 3)  Xanax 0.25 Mg Tabs (Alprazolam) .... Take 1 tablet by mouth once a day as needed 4)  Oxygen 2 L/m  .... Wear at bedtime - ahc 5)  Glucosamine-chondroitin 500-400 Mg Caps (Glucosamine-chondroitin) .... Take 1 capsule by mouth two times a day 6)   Preservision Areds Caps (Multiple vitamins-minerals) .... Take 1 capsule by mouth once a day 7)  Fexofenadine Hcl 60 Mg Tabs (Fexofenadine hcl) .... Take 1 tablet by mouth once a day 8)  Mobic 7.5 Mg Tabs (Meloxicam) .... Take 1 tablet by mouth once a day as needed arthritis 9)  Saline Nasal Spray 0.65 % Soln (Saline) .... Per bottle  Complete Medication List: 1)  Norvasc 10 Mg Tabs (Amlodipine besylate) .... Take 1 tablet by mouth once a day 2)  Nadolol 80 Mg Tabs (Nadolol) .... Take 1 tablet by mouth once a day 3)  Cozaar 50 Mg Tabs (Losartan potassium) .... One by mouth once daily 4)  Nexium 40 Mg Cpdr (Esomeprazole magnesium) .[name].. 1 by mouth two times a day 5)  Pravachol 80 Mg Tabs (Pravastatin sodium) .... One by mouth qd 6)  Xanax 0.25 Mg Tabs (Alprazolam) .... Take 1 tablet by mouth once a day as needed 7)  Oxygen 2 L/m  .... Wear at bedtime - ahc 8)  Aspir-low 81 Mg Tbec (Aspirin) .[name].. 1 once daily 9)  Glucosamine-chondroitin 500-400 Mg Caps (Glucosamine-chondroitin) .... Take 1 capsule by mouth two times a day 10)  Calcium 500 Mg Tabs (Calcium carbonate) .[name].. 1 once daily 11)  Preservision Areds Caps (Multiple vitamins-minerals) .... Take 1 capsule by mouth once a day 12)  Fish Oil 1200 Mg Caps (Omega-3 fatty acids) .... Take 1 by mouth once daily 13)  Multivitamins Tabs (Multiple vitamin) .... Take 1 by mouth once daily 14)  Tramadol Hcl 50 Mg Tabs (Tramadol hcl) .[name].. 1 once daily 15)  Nitro-dur 0.4 Mg/hr Pt24 (Nitroglycerin) .[name].. 1 as needed 16)  Furosemide 40 Mg Tabs (Furosemide) .... Once daily as needed 17)  Proair Hfa 108 (90 Base) Mcg/act Aers (Albuterol sulfate) .... 2 puffs four times a day as needed rescue 18)  Fexofenadine Hcl 60 Mg Tabs (Fexofenadine hcl) .... Take 1 tablet by mouth once a day 19)  Mobic 7.5 Mg Tabs (Meloxicam) .... Take 1 tablet by mouth once a day as needed arthritis 20)  Saline Nasal Spray 0.65 % Soln (Saline) .... Per bottle  Other Orders: T-2  View CXR (71020TC) Est. Patient Level IV (45409)  Patient Instructions: 1)  Continue on same meds.  2)  follow up family doctor as scheduled.  3)  follow up Dr.  Young as scheduled in 3 months  and as needed  4)  Please contact office for sooner follow up if symptoms do not improve or worsen    Immunization History:  Influenza Immunization History:    Influenza:  historical (12/19/2008)  Tetanus/Td Immunization History:    Tetanus/Td:  historical (10/13/2006)  Pneumovax Immunization History:    Pneumovax:  historical (12/26/2005)

## 2010-03-20 NOTE — Assessment & Plan Note (Signed)
Summary: SIX MIN WALK-PULM STRESS TEST  Nurse Visit   Vital Signs:  Patient profile:   77 year old female Pulse rate:   57 / minute BP sitting:   122 / 68  Medications Prior to Update: 1)  Norvasc 5 Mg  Tabs (Amlodipine Besylate) .... One By Mouth Qd 2)  Nadolol 20 Mg  Tabs (Nadolol) 3)  Cozaar 50 Mg  Tabs (Losartan Potassium) .... One By Mouth Once Daily 4)  Nexium 40 Mg  Cpdr (Esomeprazole Magnesium) .[name].. 1 By Mouth Two Times A Day 5)  Pravachol 80 Mg  Tabs (Pravastatin Sodium) .... One By Mouth Qd 6)  Xanax 0.25 Mg  Tabs (Alprazolam) .... Prn 7)  Oxygen 2 L/m Sleep Advanced 8)  Fluticasone Propionate 50 Mcg/act Susp (Fluticasone Propionate) .[name].. 1-2 Sprays Each Nostril Daily 9)  Aspir-Low 81 Mg Tbec (Aspirin) .[name].. 1 Once Daily 10)  Glucosamine 500 Mg Caps (Glucosamine Sulfate) .[name].. 1 Two Times A Day 11)  Calcium 500 Mg Tabs (Calcium Carbonate) .[name].. 1 Once Daily 12)  Ocuvite  Tabs (Multiple Vitamins-Minerals) .... Take 1 By Mouth Once Daily 13)  Fish Oil 1200 Mg Caps (Omega-3 Fatty Acids) .... Take 1 By Mouth Once Daily 14)  Multivitamins  Tabs (Multiple Vitamin) .... Take 1 By Mouth Once Daily 15)  Tramadol Hcl 50 Mg Tabs (Tramadol Hcl) .[name].. 1 Once Daily 16)  Nitro-Dur 0.4 Mg/hr Pt24 (Nitroglycerin) .[name].. 1 As Needed 17)  Furosemide 40 Mg Tabs (Furosemide) .... Once Daily As Needed  Allergies: No Known Drug Allergies  Orders Added: 1)  Pulmonary Stress (6 min walk) [94620]   Six Minute Walk Test Medications taken before test(dose and time): 1)  Norvasc 5 Mg  Tabs (Amlodipine Besylate) .... One By Mouth Qd-pt took this at 7:30am today- no other meds have been taken today Supplemental oxygen during the test: No  Lap counter(place a tick mark inside a square for each lap completed) lap 1 complete  lap 2 complete   lap 3 complete   lap 4 complete   Baseline  BP sitting: 122/ 68 Heart rate: 57 Dyspnea ( Borg scale) 3 Fatigue (Borg scale) 3 SPO2 91  End Of Test  BP  sitting: 130/ 74 Heart rate: 79 Dyspnea ( Borg scale) 3 Fatigue (Borg scale) 7 SPO2 87  2 Minutes post  BP sitting: 126/ 68 Heart rate: 68 SPO2 95  Stopped or paused before six minutes? Yes Reason: DIZZINESS, SOB Other symptoms at end of exercise: Dizziness  Interpretation: Number of laps  4 X 48 meters =   192 meters =    192 meters   Total distance walked in six minutes: 192 meters  Tech ID: Tivis Ringer, CNA (August 29, 2009 10:45 AM) Jeremy Johann Comments PT DID NOT COMPLETE TEST. STOPPED FOR A BREAK W/ 3 MINS REMAINING- CONTINUED AFTER 20 SECONDS REST AND THEN STOPPED WALKING W/ 55 SECS REMIANING- C/O SOB/ DIZZINESS WHICH RESOLVED AT 2 MINS POST.

## 2010-03-20 NOTE — Letter (Signed)
[facility]   Imported By: Sherian Rein 08/23/2009 10:53:11  _____________________________________________________________________  External Attachment:    Type:   Image     Comment:   External Document

## 2010-03-22 ENCOUNTER — Encounter (HOSPITAL_BASED_OUTPATIENT_CLINIC_OR_DEPARTMENT_OTHER): Payer: Medicare Other | Admitting: Hematology & Oncology

## 2010-03-22 ENCOUNTER — Encounter: Payer: Self-pay | Admitting: Internal Medicine

## 2010-03-22 DIAGNOSIS — D649 Anemia, unspecified: Secondary | ICD-10-CM

## 2010-03-22 DIAGNOSIS — D509 Iron deficiency anemia, unspecified: Secondary | ICD-10-CM

## 2010-03-22 DIAGNOSIS — N289 Disorder of kidney and ureter, unspecified: Secondary | ICD-10-CM

## 2010-03-22 LAB — CBC WITH DIFFERENTIAL (CANCER CENTER ONLY)
BASO%: 0.3 % (ref 0.0–2.0)
Eosinophils Absolute: 0.3 10*3/uL (ref 0.0–0.5)
HCT: 34.9 % (ref 34.8–46.6)
LYMPH%: 22.3 % (ref 14.0–48.0)
MCH: 27 pg (ref 26.0–34.0)
MCV: 82 fL (ref 81–101)
MONO#: 0.6 10*3/uL (ref 0.1–0.9)
MONO%: 8.5 % (ref 0.0–13.0)
NEUT%: 64.2 % (ref 39.6–80.0)
Platelets: 345 10*3/uL (ref 145–400)
RDW: 13.3 % (ref 10.5–14.6)
WBC: 6.6 10*3/uL (ref 3.9–10.0)

## 2010-03-22 LAB — RETICULOCYTES (CHCC): ABS Retic: 54.5 10*3/uL (ref 19.0–186.0)

## 2010-03-22 LAB — CHCC SATELLITE - SMEAR

## 2010-03-22 LAB — IRON AND TIBC
Iron: 50 ug/dL (ref 42–145)
UIBC: 262 ug/dL

## 2010-03-22 LAB — FERRITIN: Ferritin: 537 ng/mL — ABNORMAL HIGH (ref 10–291)

## 2010-03-22 NOTE — Miscellaneous (Signed)
Summary: Orders Update pft charges  Clinical Lists Changes  Orders: Added new Service order of Spirometry (Pre & Post) (94060) - Signed 

## 2010-03-22 NOTE — Progress Notes (Signed)
Summary: refill  Phone Note Call from Patient Call back at Home Phone [phone]   Caller: Patient Call For: young Reason for Call: Refill Medication Summary of Call: Need refill on proair hfa (90-day supply).[facility] Initial call taken by: Darletta Moll,  March 05, 2010 12:51 PM  Follow-up for Phone Call        Rx was sent to pharm.  LMOM for pt to be made awre.  Follow-up by: Vernie Murders,  March 05, 2010 2:52 PM    Prescriptions: PROAIR HFA 108 (90 BASE) MCG/ACT AERS (ALBUTEROL SULFATE) 2 puffs four times a day as needed rescue  #3 x 3   Entered by:   Vernie Murders   Authorized by:   Waymon Budge MD   Signed by:   Vernie Murders on 03/05/2010   Method used:   Faxed to ...       [facility] (mail-order)       [address] [place], [place]  [address]       Ph: [phone]       Fax: [phone]   RxID:   [id number]

## 2010-03-22 NOTE — Letter (Signed)
Summary: [facility]  [facility]   Imported By: Lester Adair 01/31/2010 08:12:25  _____________________________________________________________________  External Attachment:    Type:   Image     Comment:   External Document

## 2010-03-22 NOTE — Assessment & Plan Note (Signed)
Summary: walk  Nurse Visit   Vital Signs:  Patient profile:   77 year old female Pulse rate:   63 / minute BP sitting:   112 / 70  Six Minute Walk Test Medications taken before test(dose and time): Norvasc 10mg , Nadolol 80mg , Pravachol 80mg , Nexium 40mg , Glucosamin/Chondroitin 500/400, Fish oil, Multivitamin all at 8am Supplemental oxygen during the test: No  Lap counter(place a tick mark inside a square for each lap completed) lap 1 complete  lap 2 complete   lap 3 complete    Baseline  BP sitting: 112/ 70 Heart rate: 63 Dyspnea ( Borg scale) 4 Fatigue (Borg scale) 5 SPO2 90  End Of Test  BP sitting: 122/ 60 Heart rate: 60 Dyspnea ( Borg scale) 4 Fatigue (Borg scale) 5 SPO2 95  2 Minutes post  BP sitting: 110/ 60 Heart rate: 62 SPO2 94  Stopped or paused before six minutes? Yes Reason: Pt stopped at 3 minutes 23 seconds left inthe test due to SOB and dizziness.  Pt sats at this time were 89% HR 70. Pt did not feel like she could restart the test. Other symptoms at end of exercise: Dizziness, Leg pain  Interpretation: Number of laps  3 X 48 meters =   144 meters+ final partial lap: 0 meters =    144 meters   Total distance walked in six minutes: 144 meters  Tech ID: Carron Curie CMA (March 05, 2010 11:51 AM) Tech Comments Pt walked 3 laps and then stopped at 3 minutes 23 secondes left inthe test due to dizziness and SOB. The pt HR at this time was 70 and Sats 89%. Th ept did not feel like she could restart the test. At end of test sats had increased to 95% and HR was 60, BP 122/60. The Pt states she has dizziness, SOB and leg and ankle discomfort due to swelling during the test.  By 2 minutes post test the dizziness was gone and SOB had improved.   Allergies: No Known Drug Allergies  Orders Added: 1)  Pulmonary Stress (6 min walk) [16109]

## 2010-03-22 NOTE — Assessment & Plan Note (Signed)
Summary: worsening dyspnea and questionable pulmonary fibrosis/jd   Primary Provider/Referring Provider:  Dr Sherlyn Hay  CC:  acute visit. pt c/o worsening sob x 3 months, dry cough, chest congestion and tightness, swelling in feet/legs, and post nasal drip.Ellen [name]  History of Present Illness: August 29, 2009- COPD, Allergic rhinitis She was doing ok til she got here. Six minute walk test gave her a headache. Rescue inhaler Proair helps brefly. Long acting meds like Advair and Symbicort never did. Only occasional cough or sneezxe. CXR- minor left atelectasis; cardiomegaly with old cardiac surgery changes. NAD 91%, 87%, 95%, only 192 feet- got dizzy and stopped before walking 6 minutes at easy pace.  October 09, 2009--Presents for an acute office visit. Pt was seen by PCP 10 days ago, for ankle edema. Was tx w/ lasix which helped. She comes today to make sure she does not have fluid in her lungs. She says she feels better. Her breathing is the same with no increased dyspnea, cough or wheezing. Denies chest pain,  orthopnea, hemoptysis, fever, n/v/d. Says she noticed her ankles were more swollen in the afternoons. Took Lasix  x 3 days, on the third day the swelling went away. Ankles are better w/ no return of edema.   February 26, 2010- COPD, Allergic rhinitis....................Ellen Kitchenhere with a friend/ driver Nurse-CC: acute visit. pt c/o worsening sob x 3 months, dry cough, chest congestion and tightness, swelling in feet/legs, post nasal drip.CXR 09/2009- stable w/ elevation left hemidiaphragm.  She cancelled an appointment in October feeling badly w/ bonchitis.  Had CXR and labs at PCP mid December- told similar to 2007.  Treated by Dr Myna Hidalgo for anemia of renal insufficiency and iron deficiency.  Ankle edema has been worse. Still using lasix once daily.  Had pneumovax in 2007       Preventive Screening-Counseling & Management  Alcohol-Tobacco     Smoking Status: never  Current  Medications (verified): 1)  Norvasc 10 Mg Tabs (Amlodipine Besylate) .... Take 1 Tablet By Mouth Once A Day 2)  Nadolol 80 Mg Tabs (Nadolol) .... Take 1 Tablet By Mouth Once A Day 3)  Cozaar 50 Mg  Tabs (Losartan Potassium) .... One By Mouth Once Daily 4)  Nexium 40 Mg  Cpdr (Esomeprazole Magnesium) .Ellen [name].. 1 By Mouth Two Times A Day 5)  Pravachol 80 Mg  Tabs (Pravastatin Sodium) .... One By Mouth Qd 6)  Xanax 0.25 Mg  Tabs (Alprazolam) .... Take 1 Tablet By Mouth Once A Day As Needed 7)  Oxygen 2 L/m .... Wear At Bedtime - Ahc 8)  Aspir-Low 81 Mg Tbec (Aspirin) .Ellen [name].. 1 Once Daily 9)  Glucosamine-Chondroitin 500-400 Mg Caps (Glucosamine-Chondroitin) .... Take 1 Capsule By Mouth Two Times A Day 10)  Calcium 500 Mg Tabs (Calcium Carbonate) .Ellen [name].. 1 Once Daily 11)  Preservision Areds  Caps (Multiple Vitamins-Minerals) .... Take 1 Capsule By Mouth Once A Day 12)  Fish Oil 1200 Mg Caps (Omega-3 Fatty Acids) .... Take 1 By Mouth Once Daily 13)  Multivitamins  Tabs (Multiple Vitamin) .... Take 1 By Mouth Once Daily 14)  Tramadol Hcl 50 Mg Tabs (Tramadol Hcl) .Ellen [name].. 1 Once Daily 15)  Nitro-Dur 0.4 Mg/hr Pt24 (Nitroglycerin) .Ellen [name].. 1 As Needed 16)  Furosemide 40 Mg Tabs (Furosemide) .... Once Daily As Needed 17)  Proair Hfa 108 (90 Base) Mcg/act Aers (Albuterol Sulfate) .... 2 Puffs Four Times A Day As Needed Rescue 18)  Fexofenadine Hcl 60 Mg Tabs (Fexofenadine Hcl) .... Take 1 Tablet By  Mouth Once A Day 19)  Mobic 7.5 Mg Tabs (Meloxicam) .... Take 1 Tablet By Mouth Once A Day As Needed Arthritis 20)  Saline Nasal Spray 0.65 % Soln (Saline) .... Per Bottle 21)  Grape Seed Extract .Ellen [name].. 1 Two Times A Day  Allergies (verified): No Known Drug Allergies  Past History:  Past Surgical History: Last updated: 06/15/2007 CABG  Family History: Last updated: 07/25/2008 Mother Heart Disease Father Heart Disease Brother Colon Cancer Sister Breast Cancer  Social History: Last updated: 10/09/2009 never  smoked no alcohol  Retired Therapist, nutritional Widowed 2 children  Risk Factors: Smoking Status: never (02/26/2010)  Past Medical History: COPD (ICD-496) ALLERGIC RHINITIS (ICD-477.9) DYSPNEA (ICD-786.05) GERD (ICD-530.81) ANEMIA, IRON DEFICIENCY, CHRONIC (ICD-280.9) Anemia, renal insufficiency CAD (ICD-414.00) EUSTACHIAN TUBE DYSFUNCTION (ICD-381.81) diaphragm paresis- left OBESITY (ICD-278.00)  Review of Systems      See HPI       The patient complains of shortness of breath with activity.  The patient denies shortness of breath at rest, productive cough, non-productive cough, coughing up blood, chest pain, irregular heartbeats, acid heartburn, indigestion, loss of appetite, weight change, abdominal pain, difficulty swallowing, sore throat, tooth/dental problems, headaches, nasal congestion/difficulty breathing through nose, sneezing, itching, ear ache, anxiety, rash, change in color of mucus, and fever.    Vital Signs:  Patient profile:   77 year old female Height:      61 inches Weight:      197.13 pounds O2 Sat:      94 % on Room air Pulse rate:   66 / minute BP sitting:   130 / 60  (left arm) Cuff size:   regular  Vitals Entered By: Carver Fila (February 26, 2010 2:59 PM)  O2 Flow:  Room air CC: acute visit. pt c/o worsening sob x 3 months, dry cough, chest congestion and tightness, swelling in feet/legs, post nasal drip. Comments meds and allergies updated Phone number updated Carver Fila  February 26, 2010 2:59 PM    Physical Exam  Additional Exam:  General: A/Ox3; pleasant and cooperative, NAD, overweight, calm and appropriate SKIN: no rash, lesions NODES: no lymphadenopathy HEENT: [place]/AT, EOM- WNL, Conjuctivae- clear, PERRLA, TM-WNL, Nose- mucus, Throat- clear and wnl, Mallampati  III NECK: Supple w/ fair ROM, JVD- none, normal carotid impulses w/o bruits Thyroid CHEST: Clear to P&A, decreased, without cough or wheeze HEART: RRR, no m/g/r heard ABDOMEN: Soft and  nl;  JYN:WGNF, nl pulses,tight 2-3+ edema bilaterally to the knees NEURO: Grossly intact to observation      Impression & Recommendations:  Problem # 1:  DYSPNEA (ICD-786.05)  She never smoked and most of her lung deficit is restrictive, due to obesity/ hypoventilation and her weak left diaphragm. Weight loss could make a real difference.  Additional factors are her cardiomegaly/ CAD and anemia.   Orders: Est. Patient Level IV (62130)  Problem # 2:  CAD (ICD-414.00)   I don't have the lab results done at her primary office before Christmas, but her ankle edema could reflect peripheral venous insufficinecy, fluid retention. Cor pulmonale is less likely, but she can't remember when she had an echocardiogram. i would like her to see her cardiologist.  Her updated medication list for this problem includes:    Norvasc 10 Mg Tabs (Amlodipine besylate) .Ellen [name]... Take 1 tablet by mouth once a day    Nadolol 80 Mg Tabs (Nadolol) .Ellen [name]... Take 1 tablet by mouth once a day    Cozaar 50 Mg Tabs (Losartan potassium) .Ellen KitchenMarland KitchenMarland KitchenMarland [name]  One by mouth once daily    Aspir-low 81 Mg Tbec (Aspirin) .Ellen [name]... 1 once daily    Nitro-dur 0.4 Mg/hr Pt24 (Nitroglycerin) .Ellen [name]... 1 as needed    Furosemide 40 Mg Tabs (Furosemide) ..... Once daily as needed  Orders: Est. Patient Level IV ([address]109) Misc. Referral (Misc. Ref)  Problem # 3:  GERD (ICD-530.81) Reflux precautioxs reviewed, emphasizing how reflux can contribut to cough.  Her updated medication list for this problem includes:    Nexium 40 Mg Cpdr (Esomeprazole magnesium) .Ellen [name]... 1 by mouth two times a day  Medications Added to Medication List This Visit: 1)  Grape Seed Extract  .Ellen [name].. 1 two times a day  Patient Instructions: 1)  Please schedule a follow-up appointment in 3 months. 2)  See [facility] to schedule PFT  3)  Please make an appointment with either your cardiologist or your primary doctor to work on the fluid retention.    Immunization History:  Influenza  Immunization History:    Influenza:  historical (11/18/2009)

## 2010-03-22 NOTE — Progress Notes (Signed)
Summary: proair prescription for 90 day supply  Phone Note Call from Patient Call back at Home Phone [phone]   Caller: Patient Call For: dr young Summary of Call: Patient phoned stated when she had her breathing test she advised the nurse that her insurance company advised her that for her Proair she needed to get a 90 day supply  or three inhailers at a time. Patient called her pharmacy on Friday and was told that this was not called in. [facility] [address]. Patient wants to know if the ProAir can be called in.  Patient can be reached at [phone] Initial call taken by: Vedia Coffer,  March 12, 2010 2:19 PM  Follow-up for Phone Call        [facility]-the rx sent on 1/16 [facility]--pt aware  Follow-up by: Philipp Deputy CMA,  March 12, 2010 4:44 PM    Prescriptions: PROAIR HFA 108 (90 BASE) MCG/ACT AERS (ALBUTEROL SULFATE) 2 puffs four times a day as needed rescue  #3 x 3   Entered by:   Philipp Deputy CMA   Authorized by:   Waymon Budge MD   Signed by:   Philipp Deputy CMA on 03/12/2010   Method used:   [facility]        [facility] [address] [phone]* (retail)       [address]       [place], [place]  [address]       Ph: [phone] or [phone]       Fax: [phone]   RxID:   [id number]

## 2010-03-28 ENCOUNTER — Telehealth (INDEPENDENT_AMBULATORY_CARE_PROVIDER_SITE_OTHER): Payer: Self-pay | Admitting: *Deleted

## 2010-03-28 ENCOUNTER — Encounter: Payer: Self-pay | Admitting: Internal Medicine

## 2010-03-28 NOTE — Letter (Signed)
Summary: [facility]  [facility]   Imported By: Sherian Rein 03/22/2010 13:59:27  _____________________________________________________________________  External Attachment:    Type:   Image     Comment:   External Document

## 2010-03-28 NOTE — Miscellaneous (Signed)
Summary: Orders Update  Clinical Lists Changes  Orders: Added new Service order of No Charge Patient Arrived (NCPA0) (NCPA0) - Signed 

## 2010-04-05 NOTE — Progress Notes (Signed)
Summary: need last ov note and pft resutls faxed  Phone Note From Other Clinic   Caller: amy with [facility] cardiology Call For: young Summary of Call: Amy with [facility] phoned patient is there now and they want to know if we could fax over the results of her last office visit and the results of her PFT. Please fax to [phone] Amy can be reached at [phone] Initial call taken by: Vedia Coffer,  March 28, 2010 12:01 PM  Follow-up for Phone Call        Faxed notes.//Juanita Follow-up by: Darletta Moll,  March 28, 2010 12:26 PM

## 2010-04-11 NOTE — Letter (Signed)
Summary: [facility]  [facility]   Imported By: Sherian Rein 04/06/2010 07:59:47  _____________________________________________________________________  External Attachment:    Type:   Image     Comment:   External Document

## 2010-04-19 ENCOUNTER — Other Ambulatory Visit: Payer: Self-pay | Admitting: Hematology & Oncology

## 2010-04-19 ENCOUNTER — Encounter (HOSPITAL_BASED_OUTPATIENT_CLINIC_OR_DEPARTMENT_OTHER): Payer: Medicare Other | Admitting: Hematology & Oncology

## 2010-04-19 ENCOUNTER — Other Ambulatory Visit: Payer: Self-pay | Admitting: Family

## 2010-04-19 ENCOUNTER — Ambulatory Visit (HOSPITAL_BASED_OUTPATIENT_CLINIC_OR_DEPARTMENT_OTHER)
Admission: RE | Admit: 2010-04-19 | Discharge: 2010-04-19 | Disposition: A | Discharge status: Home / Self Care | Payer: Medicare Other | Source: Ambulatory Visit | Attending: Hematology & Oncology | Admitting: Hematology & Oncology

## 2010-04-19 DIAGNOSIS — D509 Iron deficiency anemia, unspecified: Secondary | ICD-10-CM

## 2010-04-19 DIAGNOSIS — N289 Disorder of kidney and ureter, unspecified: Secondary | ICD-10-CM

## 2010-04-19 DIAGNOSIS — J988 Other specified respiratory disorders: Secondary | ICD-10-CM

## 2010-04-19 DIAGNOSIS — R0989 Other specified symptoms and signs involving the circulatory and respiratory systems: Secondary | ICD-10-CM | POA: Insufficient documentation

## 2010-04-19 DIAGNOSIS — J069 Acute upper respiratory infection, unspecified: Secondary | ICD-10-CM | POA: Insufficient documentation

## 2010-04-19 DIAGNOSIS — D649 Anemia, unspecified: Secondary | ICD-10-CM

## 2010-04-19 LAB — CBC WITH DIFFERENTIAL (CANCER CENTER ONLY)
BASO%: 0.7 % (ref 0.0–2.0)
Eosinophils Absolute: 0.3 10*3/uL (ref 0.0–0.5)
MCH: 27.2 pg (ref 26.0–34.0)
MONO%: 8.3 % (ref 0.0–13.0)
NEUT#: 5.4 10*3/uL (ref 1.5–6.5)
Platelets: 311 10*3/uL (ref 145–400)
RBC: 4.16 10*6/uL (ref 3.70–5.32)
WBC: 7.6 10*3/uL (ref 3.9–10.0)

## 2010-05-01 NOTE — Letter (Signed)
Summary: [facility]  [facility]   Imported By: Sherian Rein 04/25/2010 12:51:24  _____________________________________________________________________  External Attachment:    Type:   Image     Comment:   External Document

## 2010-05-29 ENCOUNTER — Other Ambulatory Visit: Payer: Self-pay | Admitting: Internal Medicine

## 2010-06-01 ENCOUNTER — Encounter: Payer: Self-pay | Admitting: Internal Medicine

## 2010-06-03 ENCOUNTER — Inpatient Hospital Stay (INDEPENDENT_AMBULATORY_CARE_PROVIDER_SITE_OTHER)
Admission: RE | Admit: 2010-06-03 | Discharge: 2010-06-03 | Disposition: A | Discharge status: Home / Self Care | Payer: Medicare Other | Source: Ambulatory Visit | Attending: Family Medicine | Admitting: Family Medicine

## 2010-06-03 DIAGNOSIS — L538 Other specified erythematous conditions: Secondary | ICD-10-CM

## 2010-06-03 DIAGNOSIS — R197 Diarrhea, unspecified: Secondary | ICD-10-CM

## 2010-06-03 LAB — CBC
MCHC: 32.9 g/dL (ref 30.0–36.0)
Platelets: 338 10*3/uL (ref 150–400)
RDW: 14.6 % (ref 11.5–15.5)
WBC: 7.1 10*3/uL (ref 4.0–10.5)

## 2010-06-03 LAB — POCT URINALYSIS DIP (DEVICE)
Protein, ur: NEGATIVE mg/dL
Urobilinogen, UA: 0.2 mg/dL (ref 0.0–1.0)

## 2010-06-03 LAB — POCT I-STAT, CHEM 8
Calcium, Ion: 1.18 mmol/L (ref 1.12–1.32)
Chloride: 96 mEq/L (ref 96–112)
HCT: 39 % (ref 36.0–46.0)
Hemoglobin: 13.3 g/dL (ref 12.0–15.0)
Potassium: 3.5 mEq/L (ref 3.5–5.1)

## 2010-06-04 ENCOUNTER — Ambulatory Visit: Payer: Self-pay | Admitting: Internal Medicine

## 2010-06-04 LAB — OCCULT BLOOD, POC DEVICE: Fecal Occult Bld: NEGATIVE

## 2010-06-07 ENCOUNTER — Other Ambulatory Visit: Payer: Self-pay | Admitting: Family

## 2010-06-07 ENCOUNTER — Encounter (HOSPITAL_BASED_OUTPATIENT_CLINIC_OR_DEPARTMENT_OTHER): Payer: Medicare Other | Admitting: Hematology & Oncology

## 2010-06-07 DIAGNOSIS — N289 Disorder of kidney and ureter, unspecified: Secondary | ICD-10-CM

## 2010-06-07 DIAGNOSIS — D649 Anemia, unspecified: Secondary | ICD-10-CM

## 2010-06-07 DIAGNOSIS — D509 Iron deficiency anemia, unspecified: Secondary | ICD-10-CM

## 2010-06-07 LAB — CBC WITH DIFFERENTIAL (CANCER CENTER ONLY)
BASO#: 0.1 10*3/uL (ref 0.0–0.2)
Eosinophils Absolute: 0.4 10*3/uL (ref 0.0–0.5)
HCT: 35.9 % (ref 34.8–46.6)
HGB: 11.5 g/dL — ABNORMAL LOW (ref 11.6–15.9)
LYMPH%: 21.9 % (ref 14.0–48.0)
MCH: 27.4 pg (ref 26.0–34.0)
MCV: 86 fL (ref 81–101)
MONO#: 0.7 10*3/uL (ref 0.1–0.9)
MONO%: 9 % (ref 0.0–13.0)
RBC: 4.2 10*6/uL (ref 3.70–5.32)

## 2010-07-03 NOTE — Consult Note (Signed)
VASCULAR SURGERY CONSULTATION   RICHEL, MILLSPAUGH  DOB:  [birth year]                                       08/04/2007  EAVWU#:[id number]   The patient was referred by Dr. Katrinka Blazing for vascular surgery consultation  regarding venous insufficiency of the right leg.  This 77 year old  female has had a very superficial enlarging varicosities  in the right  distal thigh and the right pretibial region which have become quite  tense to the point of impending rupture.  She has had no bleeding  episodes but these are very tight when she stands, and she also has what  she describes as a very tired heavy sensation in the thigh and calf as  well as itching,  numbness and tingling in the right leg.  This is  relieved somewhat by elevation of the legs.  She has not worn elastic  compression stockings.  She did have her distal saphenous vein removed  during coronary artery bypass surgery by Dr. Donata Clay in 1999 up to the  knee level.  These varicosities have gradually enlarged since that time  and become increasingly symptomatic, which is currently affecting her  daily living.   PAST MEDICAL HISTORY:  1. Diabetes.  2. Hypertension.  3. Hyperlipidemia.  4. Coronary artery disease, previous myocardial infarction in 1999.  5. Negative for stroke or COPD.   PAST SURGICAL HISTORY:  Coronary artery bypass grafting 1999.   FAMILY HISTORY:  Positive for coronary artery disease and diabetes in  her mother and brothers, coronary artery disease in her father.  Negative for stroke.   SOCIAL HISTORY:  She is widowed, has two children is retired.  Does not  use tobacco or alcohol.   REVIEW OF SYSTEMS:  Reveals occasional dyspnea on exertion.  She is  occasionally on home oxygen.  She has reflux esophagitis at times from a hiatal hernia.  Occasional dizziness.  Diffuse arthritis and joint pain.   ALLERGIES:  METFORMIN.   MEDICATIONS:  Please see health history form.   PHYSICAL EXAM:   Blood pressure 152/64, heart rate 60, respirations 14.  GENERAL:  She is a healthy-appearing female in no apparent distress.  Alert and oriented x3.  NECK:  Supple, 3+ carotid pulses palpable.  No bruits are audible.  NEUROLOGIC:  Normal.  CHEST:  Clear to auscultation.  CARDIOVASCULAR:  Regular rhythm with no murmurs.  ABDOMEN:  Soft, nontender with no palpable masses.  She has 3+ femoral, popliteal, and posterior tibial pulses palpable  bilaterally.  Right leg has severe venous insufficiency with extremely  superficial cluster of varicosities in the distal thigh over the greater  saphenous vein just above the knee.  These are quite tense when she  stands and do not decompress completely when she is supine.  She also  has pretibial varicosities extending down into the lateral and anterior  compartment below the knee.  There is 1+ distal edema with no ulceration  or infection.   Venous duplex exam was performed which reveals gross reflux to the right  saphenofemoral junction and great saphenous vein down to the knee where  it has been previously removed.  This does communicate with this  superficial cluster of varicosities described above.   I think she does have severe venous insufficiency secondary to reflux of  her great saphenous system, which is causing the  symptoms and the  superficial varicosities in her distal thigh.  We will treat her  initially with elastic compression stockings (long leg - 20 mm - 30 mm)  as well as elevation and analgesics (ibuprofen on a regular basis).  We  will see her back in 3 months to see if this has relieved her symptoms.  I am quite concerned that any minor trauma to this leg would cause  significant bleeding from the superficial varicosities, and this concern  has also been noted with her cardiologist, Dr. Katrinka Blazing.   Quita Skye Hart Rochester, M.D.  Electronically Signed  JDL/MEDQ  D:  08/04/2007  T:  08/05/2007  Job:  [id number]   cc:   Lyn Records,  M.D.  Deirdre Peer. Polite, M.D.

## 2010-07-03 NOTE — Assessment & Plan Note (Signed)
[facility]                             PULMONARY OFFICE NOTE   JEWELDEAN, DROHAN                      MRN:          [id number]  DATE:12/15/2006                            DOB:          19-Dec-1926    PROBLEMS:  1. Dyspnea.  2. Chronic obstructive pulmonary disease with a restrictive component.  3. Obesity and deconditioning.  4. Allergic rhinitis.  5. Eustachian dysfunction.  6. Coronary disease/bypass.  7. Chronic iron deficiency anemia (Dr. Myna Hidalgo).  8. Diabetes.   HISTORY:  She says her breathing is okay now.  She had been put on  metformin for diagnosis of hyperglycemia/diabetes.  Feels unstable on  her feet and says this limits her walking for any attempt at building  her endurance.  No acute respiratory issues, cough, phlegm, or chest  pain.   MEDICATIONS:  Her medication list is charted and reviewed.  She is using  Spiriva but has not needed a rescue inhaler.   OBJECTIVE:  VITAL SIGNS:  Weight 188 pounds.  Blood pressure 122/60,  pulse 61, room air saturation 95%.  CHEST:  Quiet and clear.  Work of breathing is not increased.  HEART:  Heart sounds are regular without murmur.  EXTREMITIES:  No edema.   IMPRESSION:  Stable chronic obstructive pulmonary disease with dyspnea  and deconditioning.  I am not sure how significant her cardiac  limitation is.  Pulmonary function tests last year had shown mild  restriction and obstruction with moderately reduced effusion at 50% of  predicted and minimal response to bronchodilator.   PLAN:  Walk as tolerated.  Discuss her balance problems with Dr. Nehemiah Settle.  Flu vaccine was discussed and given.  Schedule return in 6 months,  earlier p.r.n.     Clinton D. Maple Hudson, MD, Tonny Bollman, FACP  Electronically Signed    CDY/MedQ  DD: 12/21/2006  DT: 12/22/2006  Job #: [id number]   cc:   Deirdre Peer. Polite, M.D.  Lyn Records, M.D.

## 2010-07-03 NOTE — Procedures (Signed)
DUPLEX DEEP VENOUS EXAM - LOWER EXTREMITY   INDICATION:  Follow-up right greater saphenous vein ablation.   HISTORY:  Edema:  Right foot.  Trauma/Surgery:  Right greater saphenous vein ablation with stab  phlebectomy 12/14/2007 by Dr. Hart Rochester.  Pain:  Minimal.  PE:  No.  Previous DVT:  No.  Anticoagulants:  Aspirin.  Other:   DUPLEX EXAM:                CFV   SFV   PopV  PTV    GSV                R  L  R  L  R  L  R   L  R  L  Thrombosis    0     0     0     0      +  Spontaneous   +     +     +     +      0  Phasic        +     +     +     +      0  Augmentation  +     +     +     +      0  Compressible  +     +     +     +      0  Competent     +     +     +     +      0   Legend:  + - yes  o - no  p - partial  D - decreased   IMPRESSION:  1. No evidence of DVT in the right lower extremity.  2. Right greater saphenous vein shows evidence of ablation without      flow from groin to distal insertion site.  3. Evidence of minimal flow noted in the saphenofemoral junction      through the lateral branch.  4. Evidence of thrombosed branches off of the greater saphenous vein.       _____________________________  Quita Skye. Hart Rochester, M.D.   AS/MEDQ  D:  12/22/2007  T:  12/22/2007  Job:  [id number]

## 2010-07-03 NOTE — Assessment & Plan Note (Signed)
OFFICE VISIT   Ellen [name], Ellen [name]  DOB:  [birth year]                                       12/22/2007  ZOXWR#:[id number]   The patient had a laser ablation of her left great saphenous vein with a  few stab phlebectomies in the left thigh last week.  She has a very thin  superficial varix at about the knee level with concern over this  possibly bleeding.  Today a venous duplex exam was performed which  reveals total occlusion of her left great saphenous vein from the  saphenofemoral junction down to the knee.  There is a lateral branch  which has some flow up in the proximal thigh which is of no consequence.  The superficial varix has thrombosed.  There are some subsurface  varicosities which will be treated with sclerotherapy in the near  future.  She has had no distal edema.  She does have a small ulceration  on the dorsum of her left foot measuring about 0.5 cm in diameter which  she states was caused by the elastic stockings so we will discontinue  those stockings and she will treat this by avoidance of pressure.  In  general she has done well.  Will have sessions of sclerotherapy arranged  by Marisue Ivan in the near future.   Quita Skye Hart Rochester, M.D.  Electronically Signed   JDL/MEDQ  D:  12/22/2007  T:  12/23/2007  Job:  [id number]

## 2010-07-03 NOTE — Assessment & Plan Note (Signed)
[facility]                             PULMONARY OFFICE NOTE   Ellen [name], Ellen [name]                      MRN:          [id number]  DATE:06/16/2006                            DOB:          Aug 28, 1926    PROBLEM LIST:  1. Dyspnea.  2. Chronic obstructive pulmonary disease with restriction.  3. Obesity and deconditioning.  4. Allergic rhinitis.  5. Eustachian dysfunction.  6. Coronary disease/bypass.  7. Chronic iron-deficiency anemia (Dr. Myna Hidalgo).   HISTORY:  One week of increased chest congestion with some wheeze, no  fever, nothing purulent. She was unable to attend pulmonary rehab  because of cost.   MEDICATIONS:  Norvasc 5 mg, nadolol, Cozaar 50 mg, Nexium 40 mg,  Pravachol 80 mg, vitamins, p.r.n. use of Darvocet, Claritin, Xanax,  Lasix.   ALLERGIES:  No medication allergy.   OBJECTIVE:  Weight 182 pounds, blood pressure 122/58, pulse regular 61,  room air saturation 95%, mild hoarseness, throat clearing.  Quiet chest. No drainage, no adenopathy.   Chest x-ray:  Her film last November showed some cardiomegaly, mild  interstitial accentuation, elevation of left hemidiaphragm. Pulmonary  function test in December showed mild restriction of total lung  capacity. Mild obstructive airways disease with an FEV1 of 1.12 (71%),  ratio 0.54. Diffusion was moderately reduced.   IMPRESSION:  COPD with exacerbation.   PLAN:  We discussed current management, weather, avoidance of pollen,  walking for endurance and treatment options.  She has Spiriva used only  intermittently and is going to use that on a regular basis now. That may  be sufficient.  We are scheduling her back in another 3 months,  understanding that if she gets worse with the present experience that  she will contact us sooner.     Clinton D. Maple Hudson, MD, Tonny Bollman, FACP  Electronically Signed    CDY/MedQ  DD: 06/21/2006  DT: 06/21/2006  Job #: [id number]

## 2010-07-03 NOTE — Assessment & Plan Note (Signed)
OFFICE VISIT   Ellen [name], Ellen [name]  DOB:  13-Feb-1927                                       11/03/2007  EAVWU#:[id number]   The patient returns today for further evaluation of a severe venous  insufficiency of the right leg.  She has very tense superficial  varicosities which are very bothersome for potential bleeding and she  has had severe discomfort which she describes as a heavy, tired  sensation in the thigh and calf as well as itching, numbness and  tingling in the right leg.  This has not been relieved by elastic  compression stocking that she has worn for the past 3 months.  She has  also tried elevation of the legs and analgesics without success.  Distal  saphenous vein has been removed by Dr. Donata Clay in 1999 and she has  severe reflux in the right saphenofemoral junction and the entire right  great saphenous vein feeding these tense varicosities in the distal  thigh and posterior calf.  Her deep venous system is normal.  The pain  and concern is definitely effecting her daily living and these need  treatment.   I would recommend initially laser ablation of her right great saphenous  vein with stab phlebectomies to remove the large varicosities.  She will  then need two courses of sclerotherapy for the superficial tense  varicosities which are subdermal and of smaller caliber but quite tense.  We will proceed with getting these procedures precertified so that we  can treat her symptomatic varicosities in the near future, which are  definitely affecting her daily living.   Quita Skye Hart Rochester, M.D.  Electronically Signed   JDL/MEDQ  D:  11/03/2007  T:  11/05/2007  Job:  [id number]

## 2010-07-03 NOTE — Procedures (Signed)
LOWER EXTREMITY VENOUS REFLUX EXAM   INDICATION:  Right lower extremity varicose veins.   EXAM:  Using color-flow imaging and pulse Doppler spectral analysis, the  right common femoral, superficial femoral, popliteal, posterior tibial,  greater and lesser saphenous veins are evaluated.  There is no evidence  suggesting deep venous insufficiency in the right lower extremity.   The right saphenofemoral junction is not competent.  The right GSV is  not competent with the caliber as described below.   The right proximal short saphenous vein was not adequately visualized.   GSV Diameter (used if found to be incompetent only)                                            Right    Left  Proximal Greater Saphenous Vein           0.91 cm  cm  Proximal-to-mid-thigh                     0.58 cm  cm  Mid thigh                                 cm       cm  Mid-distal thigh                          0.47 cm  cm  Distal thigh                              cm       cm  Knee                                      0.64 cm  cm   IMPRESSION:  1. Right greater saphenous vein reflux is identified with the caliber      ranging from 0.64 cm to 0.91 cm knee to groin.  2. The right greater saphenous vein is not aneurysmal.  3. The right greater saphenous vein is not tortuous.  4. The deep venous system is competent.   ___________________________________________  Quita Skye. Hart Rochester, M.D.   CH/MEDQ  D:  08/05/2007  T:  08/05/2007  Job:  [id number]

## 2010-07-06 NOTE — H&P (Signed)
NAME:  Ellen [name], Ellen [name] NO.:  [id number]   MEDICAL RECORD NO.:  [id number]            PATIENT TYPE:   LOCATION:                                 FACILITY:   PHYSICIAN:  Payton Doughty, M.D.           DATE OF BIRTH:   DATE OF ADMISSION:  DATE OF DISCHARGE:                              HISTORY & PHYSICAL   ADMITTING DIAGNOSIS:  Spondylosis at L4-5, foraminal disk on the right  side at L3-4.   HISTORY OF PRESENT ILLNESS:  This is a very nice 77 year old left-handed  white lady who in August began having pain in her back, and it began  heading down her right leg.  It gets toward her groin, and then crosses  over her knee medially.  Her bladder has not been affected.  She  occasional gets numbness in the foot.   PAST MEDICAL HISTORY:  Remarkable for coronary artery disease.   MEDICATIONS:  1. She uses nadolol 80 mg a day.  2. Norvasc 5 mg a day.  3. Cozaar 100 mg a day.  4. Pravachol 80 mg a day.  5. Nexium 80 mg a day.  6. Librax 2.5 mg on a p.r.n. basis.  7. Lyrica 100 mg a couple times a day.  8. Lasix 40 mg on a p.r.n. basis.  9. Percocet on a p.r.n. basis.  10.Clarinex on a p.r.n. basis.  11.Xanax 0.25 mg on a p.r.n. basis.  12.She is on an aspirin, vitamins, calcium, Ocuvite and glucosamine.   ALLERGIES:  NONE.   SURGICAL HISTORY:  1. She has had an appendectomy tonsillectomy in the 1940s.  2. Hysterectomy in the 1970s.  3. Breast cyst in the 1970s.  4. Cholecystectomy in 1986.  5. Right cataract in 1992.  6. Hernia in 1992.  7. Left cataract in 1995.  8. Coronary bypass in 1999.  9. Benign tumor in 2001, and she has macular degeneration.   SOCIAL HISTORY:  She does not smoke, does not drink, and is retired.   FAMILY HISTORY:  Parents are deceased.  History is not given, save for  diabetes, hypertension, and cancer.   REVIEW OF SYSTEMS:  Remarkable for glasses, hearing loss, tenderness,  balance disturbance, nasal congestion, sinus problems,  hypertension,  hypercholesterolemia, leg pain, shortness of breath, arm weakness, leg  weakness, back pain, arm pain, leg pain, arthritis and neck pain.   PHYSICAL EXAMINATION:  HEENT:  Within normal limits.  She has reasonable  range of motion in her neck.  CHEST:  Clear.  CARDIAC:  Regular rate and  rhythm.  ABDOMEN:  Nontender with no hepatosplenomegaly. EXTREMITIES:  Without clubbing or cyanosis.  GU:  Deferred.  Peripheral pulses are  good.  NEUROLOGIC:  She is awake, alert and oriented.  Cranial nerves  are intact.  Motor exam shows 5/5 strength throughout the upper and  lower extremities.  There is no current sensory deficit.  Reflexes are 2  at the left knee, flicker  at the right knee.  Ankle jerks are absent.  MRI demonstrates foraminal disk on  the right side at L3-4 as well as  significant stenosis bilaterally at L4-5.   CLINICAL IMPRESSION:  Right L3 radiculopathy related to foraminal disk,  and spondylosis at L4-5.  The plan is for a foraminal diskectomy at L3-4  on the right, and bilaterally laminotomy and forearm at 4-5.  The risks  and benefits of this approach have been discussed with her, and she  wishes to proceed.           ______________________________  Payton Doughty, M.D.     MWR/MEDQ  D:  02/19/2006  T:  02/19/2006  Job:  [id number]

## 2010-07-06 NOTE — Discharge Summary (Signed)
NAMEMarland Kitchen  Ellen, [name]               ACCOUNT NO.:  [id number]   MEDICAL RECORD NO.:  [id number]          PATIENT TYPE:  INP   LOCATION:  3014                         FACILITY:  [facility]   PHYSICIAN:  Coletta Memos, M.D.     DATE OF BIRTH:  11/03/1926   DATE OF ADMISSION:  02/19/2006  DATE OF DISCHARGE:  02/22/2006                               DISCHARGE SUMMARY   ADMITTING DIAGNOSES:  1. Spondylosis, L4-5.  2. Herniated disk, L3-4.   PROCEDURES:  1. L4-5 laminotomy, foraminotomy, bilateral.  2. Right L3 diskectomy in the foramen.   COMPLICATIONS:  None.   DISCHARGE STATUS:  Alive and well.   DESTINATION:  Home.   MEDICATIONS:  Darvocet for pain.   She was given instructions to call the office to make an appointment.  The patient is tolerating a regular diet, ambulating, and doing well at  discharge.  The wound is clean, and dry.           ______________________________  Coletta Memos, M.D.     KC/MEDQ  D:  02/22/2006  T:  02/22/2006  Job:  [id number]

## 2010-07-06 NOTE — Procedures (Signed)
[place]  Patient:    Ellen [name], Ellen [name]                        MRN: [id number] Proc. Date: 06/25/00 Attending:  Verlin Grills, M.D. CC:         Modesta Messing, M.D.   Procedure Report  DATE OF BIRTH:  1926-04-24  PROCEDURE PERFORMED:  Flexible proctosigmoidoscopy.  ENDOSCOPIST:  Verlin Grills, M.D.  INDICATIONS FOR PROCEDURE:  [name] is a 77 year old female who is referred for screening colonoscopy and possible polypectomy to prevent colon cancer.  Her brother was diagnosed with colon cancer.  I discussed with [name] the complications associated with colonoscopy and polypectomy including a 1 per 1000 risk of bleeding and 4 per 1000 risk of colon rupture requiring emergency surgery.  Ms. Fenter has signed the operative permit.  PREMEDICATION:  Versed 5 mg, fentanyl 50 mcg.  INSTRUMENT USED:  Olympus pediatric colonoscope.  DESCRIPTION OF PROCEDURE:  After obtaining informed consent, the patient was placed in the left lateral decubitus position.  I administered intravenous fentanyl and intravenous Versed to achieve conscious sedation for the procedure. The patients blood pressure, oxygen saturation and cardiac rhythm were monitored throughout the procedure and documented in the medical record.  Anal inspection was normal.  Digital rectal examination was normal.  The Olympus pediatric video colonoscope was introduced into the rectum and under direct vision, advanced to approximately 65 cm from the anal verge.  Due to colonic loop formation, I was unable to complete a full colonoscopy.  Colonic preparation for the exam today was satisfactory.  Endoscopic appearance of the rectum and left colon with the endoscope advanced to approximately 65 cm from the anal verge, reveals left colonic diverticulosis but no endoscopic evidence for the presence of colorectal neoplasia.  ASSESSMENT: 1. Left colonic  diverticulosis. 2. Incomplete colonoscopy due colonic loop formation.  RECOMMENDATIONS:  If clinically warranted, [name] should be scheduled for an air contrast barium enema to evaluate the remainder of her colonic mucosa to rule out colorectal neoplasia. DD:  06/25/00 TD:  06/25/00 Job: [id number]

## 2010-07-06 NOTE — Assessment & Plan Note (Signed)
[facility]                             PULMONARY OFFICE NOTE   Ellen [name], Ellen [name]                      MRN:          [id number]  DATE:03/17/2006                            DOB:          11-Oct-1926    PROBLEM:  1. Dyspnea.  2. Chronic obstructive pulmonary disease with restriction.  3. Obesity and deconditioning.  4. Allergic rhinitis.  5. Eustachian dysfunction.  6. Coronary disease/bypass.  7. Chronic anemia/iron.   HISTORY:  Since I last saw her in November she had lumbar spine surgery  by Dr. Trey Sailors and has not been able to be active as she recovers from  that.  Breathing otherwise is okay.  She does notice she is sensitive to  weather changes.  She is aware that her anemia affects her dyspnea and  says that Dr. Myna Hidalgo is able to maintain her hemoglobin between 10 and  11.  She has not had chest pain or ankle edema, no acute events.   MEDICATIONS:  1. Norvasc 5 mg.  2. Nadolol.  3. Cozaar.  4. Nexium.  5. Pravachol.  6. Vitamins.  7. Spiriva.  8. P.r.n. use of Claritin.  9. Xanax.  10.Lasix.  11.Promethazine.   No medication allergy.   Chest x-ray November 8 had shown cardiomegaly, mild interstitial  accentuation and elevation of the left hemi-diaphragm.  No adenopathy.   Pulmonary function testing on December 6 showed mild restriction of  total lung capacity at 72% of normal.  Mild obstructive airways disease  with an FEV1 of 1.12 (71%), slow in particular in small airways and  minimal response at small airway level only, to bronchodilator.  Diffusion was moderately reduced to 50% of predicted.  On a 6 minute  walk test she went 357 feet without stopping or pain.  Blood pressure  went from 136/76 at beginning to 152/84 at end of test and 2 minutes  later was 128/80.  Heart rate rose from 58 to 70 and then settled 2  minutes after study to 64.  Oxygen saturation began at 97% and stayed at  97% through the study and  recovery on room air, which was considered  good maintenance of oxygen saturation.   OBJECTIVE:  Weight is down to 185 pounds.  BP 126/60, pulse regular 55.  Room air saturation 94%.  She is overweight.  LUNGS:  Fields were quiet and clear, unlabored without rales, rhonchi or  cough.  There was no neck vein distention or peripheral edema.  HEART:  Sounds were regular without murmur.   IMPRESSION:  Multifactorial dyspnea reflecting components of fixed small  airway obstruction, impaired diffusion capacity, obesity and  deconditioning, anemia, and an uncertain component of cardiogenic  dyspnea.   PLAN:  When her back surgery heals, I have suggested that she contact  the [facility] to see if that would be of interest  to her.  Meanwhile, she will continue present meds and we are scheduling  a return in 3 months, earlier p.r.n.     Clinton D. Maple Hudson, MD, FCCP, FACP  Electronically Signed  CDY/MedQ  DD: 03/17/2006  DT: 03/18/2006  Job #: [id number]   cc:   Rose Phi. Myna Hidalgo, M.D.  Lyn Records, M.D.  Ladell Pier, M.D.

## 2010-07-06 NOTE — Op Note (Signed)
NAME:  Ellen [name], Ellen [name]               ACCOUNT NO.:  [id number]   MEDICAL RECORD NO.:  [id number]          PATIENT TYPE:  INP   LOCATION:  2899                         FACILITY:  [facility]   PHYSICIAN:  Payton Doughty, M.D.      DATE OF BIRTH:  [birth year]   DATE OF PROCEDURE:  DATE OF DISCHARGE:                               OPERATIVE REPORT   PREOPERATIVE DIAGNOSES:  1. Spondylosis L4-5.  2. Right foraminal herniated disk at L3-4 in the neural foramen.   POSTOPERATIVE DIAGNOSES:  1. Spondylosis L4-5.  2. Right foraminal herniated disk at L3-4 in the neural foramen.   OPERATIVE PROCEDURE:  1. L4-5 laminotomy, foraminotomy done bilaterally.  2. Right L3-4 foraminal diskectomy.   SERVICE:  Neurosurgery.   ANESTHESIA:  General endotracheal.   PREP:  Standard prep was done with alcohol wipe.   COMPLICATIONS:  None.   NURSE ASSISTANTS:  [place].   DOCTOR ASSISTANTS:  Clydene Fake, M.D.   ___________    This is a 77 year old lady with severe claudicatory complaints and a  right L3 radiculopathy, taken to the operating room, smoothly and easily  __________ intubated, placed prone on the operating table.  Following  shave, prep and drape in the usual sterile fashion, the skin was  infiltrated with 1% lidocaine with 1:400,000 epinephrine.  The skin was  incised from the bottom of L2 to the bottom of L4, and the lamina of L4  was exposed bilaterally in the subperiosteal plane, and the lamina of L3  was exposed on the right side out over the pars interarticularis.  Intraoperative x-ray confirmed correct disk level had been confirmed,  correct disk level bilateral laminotomy, foraminotomy was done at L4-5.  It was carried out to the top of the ligamentum flavum, and it was  removed in a retrograde fashion.  This allowed exposure of the origin of  the L5 root, as well as decompression of the L4-5 facet area.  Careful  inspection of neural foramina on both sides revealed them to be  open  following decompression.  Attention was then turned to the right L3  lamina where working through the pars interarticularis with a high-speed  drill down to the superior portion of the L3-4 facet joint.  Ligamentum  flavum was uncovered.  It was removed.  This exposed the L3 root as it  rounded the pedicle, as well as a herniated disk that was compressing  it.  The disk was removed without difficulty.  The disk space itself was  carefully explored, and all graspable fragments were removed.  Following  complete decompression, the nerve root was explored and found to be  open.  The wound was irrigated, and hemostasis assured.  Depo-Medrol  soaked fat was used to fill all laminotomy and foraminotomy defects.  Successive layers of 0 Vicryl, 2-0 Vicryl and 3-0 nylon were used to  close.  Betadine, Telfa dressing was applied and made occlusive with  OpSite, and the patient returned to the recovery room in good condition.           ______________________________  Payton Doughty,  M.D.     MWR/MEDQ  D:  02/19/2006  T:  02/19/2006  Job:  [id number]

## 2010-07-06 NOTE — Op Note (Signed)
[place]  Patient:    Ellen [name], Ellen [name]                      MRN: [id number] Proc. Date: 05/11/99 Adm. Date:  [id number] Attending:  Serena Colonel H CC:         Modesta Messing, M.D.                           Operative Report  PREOPERATIVE DIAGNOSIS:  Right parotid mass.  POSTOPERATIVE DIAGNOSIS:  Right parotid mass.  PROCEDURE:  Right superficial parotidectomy with dissection and preservation of the facial nerve.  SURGEON:  Jefry H. Pollyann Kennedy, M.D.  ASSISTANT:  Gloris Manchester. Lazarus Salines, M.D.  ANESTHESIA:  General endotracheal anesthesia.  COMPLICATIONS:  None.  FINDINGS:  A firm mass, approximately 2 cm in the tail of the parotid gland on he right side.  Surrounding parotid tissue was normal.  A couple of small lymph nodes associated with the superficial aspect of the parotid gland, but no other abnormalities identified.  Patient tolerated the procedure well, was awakened, extubated and transferred to recovery in stable condition.  REFERRING PHYSICIAN:  Modesta Messing, M.D.  HISTORY:  This is a 77 year old lady, who had a lump found in the parotid gland  last year.  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 Risks, benefits, alternatives and complications of the procedure explained to the patient, who seemed to understand and agreed to surgery.  PROCEDURE:  Patient was taken to the operating room and placed on the operating  table in the supine position.  Following induction of general endotracheal anesthesia, the patient was prepped and draped in standard fashion.  A table was turned 90 degrees to allow two surgeons to approach the patients face.  A preauricular incision was outlined with a marking pen with continuation down posteriorly along the mastoid tip and down along a cervical skin crease two fingerbreadths below the angle of the mandible.  Electrocautery was used to incise the skin and  subcutaneous tissue.  Careful dissection was accomplished to identify and dissect the superficial branch of the great auricular nerve to its entry into the ear lobe.  This was retracted posteriorly.  The parotid gland was then dissected off the upper sternocleidomastoid muscle and brought forward.  The gland was then dissected off the cartilaginous ear canal.  Careful dissection was accomplished to expose the posterior belly of the digastric muscle and to dissect the gland off of the tympanomastoid suture line.  The main trunk of the facial nerve was identified in this area and careful dissection was used with a McCabe  dissector to follow the main trunk out to the main division.  The lower division was dissected out in its entirety to bring the tail of the parotid superiorly. The upper division was only partially dissected as the superior aspect of the gland was not involved.  A cuff of parotid tissue surrounding the mass was removed with the mass and sent for pathologic evaluation.  There were no other palpable abnormalities.  The dissection was accomplished using bipolar cautery and sharp  dissection and 4-0 silk ties as needed.  The parotid bed was irrigated with saline and bipolar cautery was used for hemostasis.  A Valsalva maneuver was used to test for any signs of bleeding and there was none.  The wound was closed in layers using 4-0 chromic interrupted on the subcutaneous layer and a  running 5-0 nylon on the skin.  A 7-French round Jackson-Pratt drain was left in the wound, secured to the lower part of the incision to the skin and the patient was awakened, extubated nd transferred to recovery in stable condition.  Prior to closure, the main trunk nd upper and lower divisions were able to be stimulated with a nerve stimulator at  0.5 mA. DD:  05/11/99 TD:  05/11/99 Job: [id number]

## 2010-07-06 NOTE — Assessment & Plan Note (Signed)
 [facility]                               PULMONARY OFFICE NOTE   Ellen [name], Ellen [name]                      MRN:          [id number]  DATE:12/26/2005                            DOB:          February 23, 1926    PULMONARY FOLLOWUP   PROBLEMS:  1. Dyspnea.  2. Chronic obstructive pulmonary disease with restriction.  3. Obesity and deconditioning.  4. Allergic rhinitis.  5. Eustachian dysfunction.  6. Coronary disease/bypass.   HISTORY:  She says Dr. Gustavo Lah injections have raised her hemoglobin to  10.4 and that she had heart catheterization, okay, in July.  She had  fallen out of her bed this summer, hurting her left ribcage.  Chest x-ray  for that showed pancreatic cysts, which she was sent to [facility] for  aspiration.  Results are pending.  She had a little breathing last week, but  her breathing really has been pretty stable through all of this.  No routine  cough or phlegm.  No palpitation or exertional chest pain and no bleeding.  She has had flu vaccine.  She has had a pneumococcal vaccine once many years  ago and would like a booster.   MEDICATIONS:  1. Norvasc.  2. Nadolol.  3. Cozaar.  4. Nexium.  5. Pravachol.  6. Ocuvite.  7. Glucosamine with chondroitin.  8. Claritin.  9. Xanax 0.25 mg.  10.Lasix 40 mg used p.r.n.   No medication allergy.   OBJECTIVE:  Weight 196 pounds, BP 138/82, pulse regular 62.  Room air  saturation 94%.  She is obese.  There are some fine crackles in the mid left chest.  No cough or wheeze.  There is no neck vein distension or peripheral edema.  Heart sounds are  regular.  I do not hear a murmur.   IMPRESSION:  Multifactorial dyspnea.   PLAN:  1. Pneumococcal vaccine booster.  2. Chest x-ray.  3. Pulmonary function test.  4. Schedule return 1 month, earlier p.r.n.     Clinton D. Maple Hudson, MD, Tonny Bollman, FACP  Electronically Signed    CDY/MedQ  DD: 12/29/2005  DT: 12/29/2005  Job #: [id number]   cc:    Ladell Pier, M.D.  Rose Phi. Myna Hidalgo, M.D.  Lyn Records, M.D.

## 2010-07-06 NOTE — Discharge Summary (Signed)
NAME:  KAMDYN, COVEL NO.:  [id number]   MEDICAL RECORD NO.:  [id number]          PATIENT TYPE:  OBV   LOCATION:  2008                         FACILITY:  [facility]   PHYSICIAN:  Lyn Records, M.D.   DATE OF BIRTH:  01/25/1927   DATE OF ADMISSION:  08/25/2005  DATE OF DISCHARGE:  08/26/2005                                 DISCHARGE SUMMARY   DISCHARGE DIAGNOSES:  1.  Chest pain, resolved.  2.  Known coronary artery disease.  History of coronary artery bypass      grafting in 1999.  3.  Presyncope.  4.  Hypertension.  5.  Hypercholesterolemia, treated.  6.  Status post cholecystectomy, appendectomy, tonsillectomy, hernia repair,      hysterectomy and cataract/eye surgery.   HISTORY OF PRESENT ILLNESS:  [name] is a 77 year old female who had an  episode of presyncope on the Wednesday prior to admission.  This was  associated with nausea and weakness.  Since that time, she has had  occasional chest pain.  This morning she began having substernal chest pain  with left arm pain.  She was admitted to the hospital for further  evaluation.  In addition, the patient is planning low back surgery for a  herniated disk in the near future and does need cardiac clearance.   Per records, the patient's cardiac enzymes were negative.  Dr. Katrinka Blazing planned  for cardiac catheterization, which took place on August 26, 2005.  The results  are as follows:  EF 65%.  Native vessels show a 50% left main.  LAD 70%.  Proximal D1 90%, which was small in diameter.  Second diagonal 85% with a  large vessel and was grafted.  Circumflex OM 1 90% and M was grafted as  well.  The RCA was occluded proximally.  The patient had the following  graft:  Saphenous vein graft to the diagonal, saphenous vein graft to the OM  1, saphenous vein graft to the RCA, LIMA to LAD and these were all patent.   DISCHARGE INSTRUCTIONS:  Dr. Katrinka Blazing felt the patient should continue medical  management and the patient  will be discharged later today to home.  She has  a follow up appointment to Dr. Katrinka Blazing on September 10, 2005 at 3:15 p.m.  Otherwise, she is continue her home medications, which include Nadolol 80 mg  a day, Norvasc 10 mg a day, Cozaar 100 mg a day, Pravachol 80 mg a day,  Nexium 40 mg a day, Librax before Lyrica twice a day, Lasix once a day if  needed, Clarinex as needed, Xanax prior to admission, Spiriva inhaler as  before baby aspirin daily, glucosamine and fish oil daily.   She is not to drive for two days.  No lifting over 10 pounds for one week.  Clean cath site gently with soap and water.  Remain on low fat diet.      Guy Franco, P.A.      Lyn Records, M.D.  Electronically Signed    LB/MEDQ  D:  08/26/2005  T:  08/27/2005  Job:  [id number]

## 2010-07-06 NOTE — H&P (Signed)
NAME:  Ellen [name], Ellen [name] NO.:  [id number]   MEDICAL RECORD NO.:  [id number]          PATIENT TYPE:  OBV   LOCATION:  2008                         FACILITY:  [facility]   PHYSICIAN:  Corky Crafts, MDDATE OF BIRTH:  Oct 29, 1926   DATE OF ADMISSION:  08/25/2005  DATE OF DISCHARGE:                                HISTORY & PHYSICAL   REFERRING PHYSICIAN:  Dr. Freida Busman from the emergency room.   PRIMARY CARDIOLOGIST:  Dr. Verdis Prime.   REASON FOR ADMISSION:  Chest pain.   HISTORY OF PRESENT ILLNESS:  The patient is a 77 year old with a history of  coronary artery disease, status post CABG in 1999.  She began feeling ill  last Wednesday where she had a presyncopal episode with associated nausea  and weakness.  Since that time, she has had occasional chest tightness.  She  has used sublingual nitroglycerin four to five times with relief of the  pain.  Typically, the pain has occurred while she is at rest, although it  did occur once when just getting out of bed.  It does not last a long time.  She describes it as a dull pain.  This morning, she had left arm pain  associated.  Of note, she is planning to have low back surgery I believe a  herniated disc.  She will be needing cardiac clearance for this surgery.  In  addition, the patient states that she has shortness of breath with exertion.  Her exertion is also limited by weakness and back pain.  Currently at rest,  she reports only some back pain from lying on the stretcher.  She is not  having any chest pressure.   PAST MEDICAL HISTORY:  1.  Coronary artery disease.  2.  Hypertension.  3.  High cholesterol.  4.  GERD.  5.  Seasonal allergies.  6.  Question of COPD.  7.  Arthritis.   PAST SURGICAL HISTORY:  1.  CABG in 1999.  2.  Cholecystectomy.  3.  Appendectomy.  4.  Tonsillectomy.  5.  Hernia repair.  6.  Hysterectomy.  7.  Cataract.   ALLERGIES:  No known drug allergies.   CURRENT MEDICATIONS:  1.   Nadolol 80 mg daily.  2.  Norvasc 10 mg daily.  3.  Cozaar 100 mg daily.  4.  Pravachol 80 mg daily.  5.  Nexium 40 mg daily.  6.  Librax p.r.n.  7.  Lyrica 75 mg b.i.d.  8.  Lasix 40 mg p.r.n.  9.  Clarinex.  10. Xanax 0.25 mg daily.  11. Spiriva inhaler.  12. Aspirin 81 mg daily.  13. Glucosamine chondroitin and fish oil.   SOCIAL HISTORY:  The patient does not smoke, drink or use any drugs.  She is  retired.   FAMILY HISTORY:  Significant for coronary artery disease in her family.   REVIEW OF SYSTEMS:  No fevers or chills.  No weight loss.  Chest pain and  shortness of breath as described above.  She has also had nausea.  No  vomiting, no dysuria, no focal weakness or  rash.  All other systems are  negative.   PHYSICAL EXAMINATION:  VITAL SIGNS:  Blood pressure 132/59, pulse 64.  GENERAL:  The patient is awake and alert and in no apparent distress.  HEENT:  Head:  Normocephalic and atraumatic.  Eyes:  Extraocular movements  intact.  NECK:  No JVD.  No carotid bruits.  CARDIOVASCULAR:  Regular rate and rhythm.  S1 and S2.  LUNGS:  Clear to auscultation bilaterally.  ABDOMEN:  Soft, nontender, obese.  EXTREMITIES:  No edema.  2+ posterior tibial pulses bilaterally.  NEUROLOGICAL:  No focal deficits.  SKIN:  No rash.  BACK:  No kyphosis or scoliosis.  PSYCHIATRIC:  Normal mood and affect.   LABORATORY DATA:  Shows troponin less than 0.05.  BMET is pending.  White  count is 6.1, hematocrit of 33.  EKG shows normal sinus rhythm with  nonspecific ST and T-wave flattening.   ASSESSMENT:  A 77 year old with coronary artery disease and chest pain  relieved by nitroglycerin over the past four to five days.   PLAN:  1.  Continue aspirin.  We will also start Lovenox.  2.  Continue beta blocker and Cozaar for blood pressure control.  3.  Continue statin.  4.  We will make the patient n.p.o. for possible cardiac catheterization or      a stress test.  5.  If the patient has  recurrent pain, we will start nitrates at that time.      Corky Crafts, MD  Electronically Signed     JSV/MEDQ  D:  08/25/2005  T:  08/25/2005  Job:  [id number]

## 2010-07-06 NOTE — Cardiovascular Report (Signed)
NAME:  Ellen [name], Ellen [name] NO.:  [id number]   MEDICAL RECORD NO.:  [id number]          PATIENT TYPE:  OBV   LOCATION:  2008                         FACILITY:  [facility]   PHYSICIAN:  Lyn Records, M.D.   DATE OF BIRTH:  [birth year]   DATE OF PROCEDURE:  08/26/2005  DATE OF DISCHARGE:                              CARDIAC CATHETERIZATION   INDICATIONS FOR PROCEDURE:  Chest pain responsive to nitroglycerin in this  77 year old female with history of coronary bypass grafting x4 in 1999 by  Dr. Kathlee Nations Trigt.   PROCEDURE PERFORMED:  1.  Left heart catheterization  2.  Selective coronary angiography.  3.  Left ventriculography.  4.  Bypass graft angiography including internal mammary graft angiography.   DESCRIPTION:  After informed consent the patient was brought to the  catheterization laboratory in the post absorptive state.  A 6-French sheath  was placed in the right femoral artery using the modified Seldinger  technique.  A 6-French A2 multipurpose catheter was then used for  hemodynamic recordings, left ventriculography by hand injection, and  selective native right coronary angiography.  A #4 6-French left Judkins  catheter was used for left coronary angiography.  The A2 multipurpose  catheter had also been used for saphenous vein graft angiography.  A 6-  [place] internal mammary catheter was used for internal mammary artery  angiography.  The patient tolerated the procedure without significant  complications.   The patient received 1 mg of IV Versed.   The sheathogram was performed and did not demonstrate appropriate anatomy  for Angio-Seal arteriotomy closure.   RESULTS:  1.  Hemodynamic data:      1.  Aortic pressure 155/59.      2.  Left ventricular pressure 155/22.  2.  Left ventriculography:  The left ventricle is normal in size and      demonstrates normal contractility.  EF is 65%.  No regional wall motion      abnormality.  3.  Coronary  angiography:      1.  Left main coronary:  The left main was calcified distally.  There          was 40-50% distal narrowing.      2.  Left anterior descending coronary:  There is an eccentric 70-80%          stenosis beyond the left main but proximal to the first septal          perforator.  The first diagonal which is relatively small and          difficult to lay out contains ostial 90% stenosis.  The second          diagonal contains 85% ostial narrowing, is large, and is filled by          saphenous vein graft.  The LIMA to the LAD was seen to be          retrograde, patent, with native left coronary injection..      3.  Circumflex artery:  There is 90-95% stenosis in the first obtuse  marginal.  Competitive flow from a patent graft to the obtuse          marginal is noted.      4.  Right coronary:  The right coronary contains proximal 100%          occlusion.  4.  Bypass graft angiography:      1.  Saphenous vein graft to the diagonal #2:  Widely patent.      2.  Saphenous vein graft to the obtuse marginal #1:  Widely patent.      3.  Saphenous vein graft to the distal right coronary:  Widely patent.      4.  LIMA graft to the LAD:  This graft was not selectively engaged but          subselectively engaged with an injection into the left subclavian          and demonstrated patency.   CONCLUSIONS:  1.  Patent saphenous vein and left internal mammary grafts as noted above.  2.  Severe native vessel coronary disease with borderline significant left      main, subtotally occluded first obtuse marginal, high-grade first and      second diagonal branches, and totally occluded right coronary.  3.  Normal left ventricular function.   PLAN:  Continue medical therapy, risk factor modification.  No significant  change in therapy.  Chest discomfort possibly from the first diagonal which  is amenable to medical therapy and would not be a good vessel to attempt  intervention upon.   The patient should be low risk for general anesthesia  and planned lumbar surgery by Dr. Trey Sailors.      Lyn Records, M.D.  Electronically Signed     HWS/MEDQ  D:  08/26/2005  T:  08/26/2005  Job:  [id number]   cc:   Kerin Perna, M.D.  [address]  [place]  [place] [address]   Payton Doughty, M.D.  Fax: [phone]   Ladell Pier, M.D.  Fax: [phone]

## 2010-07-19 ENCOUNTER — Other Ambulatory Visit: Payer: Self-pay | Admitting: Hematology & Oncology

## 2010-07-19 ENCOUNTER — Encounter (HOSPITAL_BASED_OUTPATIENT_CLINIC_OR_DEPARTMENT_OTHER): Payer: Medicare Other | Admitting: Hematology & Oncology

## 2010-07-19 DIAGNOSIS — N289 Disorder of kidney and ureter, unspecified: Secondary | ICD-10-CM

## 2010-07-19 DIAGNOSIS — D649 Anemia, unspecified: Secondary | ICD-10-CM

## 2010-07-19 LAB — CBC WITH DIFFERENTIAL (CANCER CENTER ONLY)
BASO#: 0 10*3/uL (ref 0.0–0.2)
BASO%: 0.6 % (ref 0.0–2.0)
EOS%: 4.7 % (ref 0.0–7.0)
HCT: 34.3 % — ABNORMAL LOW (ref 34.8–46.6)
HGB: 10.9 g/dL — ABNORMAL LOW (ref 11.6–15.9)
LYMPH%: 23.5 % (ref 14.0–48.0)
MCHC: 31.8 g/dL — ABNORMAL LOW (ref 32.0–36.0)
MCV: 85 fL (ref 81–101)
MONO#: 0.7 10*3/uL (ref 0.1–0.9)
NEUT%: 61.3 % (ref 39.6–80.0)
RDW: 14 % (ref 11.1–15.7)

## 2010-07-19 LAB — IRON AND TIBC
Iron: 46 ug/dL (ref 42–145)
TIBC: 307 ug/dL (ref 250–470)
UIBC: 261 ug/dL

## 2010-07-19 LAB — RETICULOCYTES (CHCC)
ABS Retic: 53.7 10*3/uL (ref 19.0–186.0)
RBC.: 4.13 MIL/uL (ref 3.87–5.11)

## 2010-07-24 ENCOUNTER — Encounter: Payer: Self-pay | Admitting: Internal Medicine

## 2010-07-26 ENCOUNTER — Encounter: Payer: Self-pay | Admitting: Internal Medicine

## 2010-07-26 ENCOUNTER — Ambulatory Visit (INDEPENDENT_AMBULATORY_CARE_PROVIDER_SITE_OTHER): Payer: Medicare Other | Admitting: Internal Medicine

## 2010-07-26 VITALS — BP 112/60 | HR 62 | Ht 61.0 in | Wt 181.2 lb

## 2010-07-26 DIAGNOSIS — J449 Chronic obstructive pulmonary disease, unspecified: Secondary | ICD-10-CM

## 2010-07-26 DIAGNOSIS — R0602 Shortness of breath: Secondary | ICD-10-CM

## 2010-07-26 MED ORDER — TIOTROPIUM BROMIDE MONOHYDRATE 18 MCG IN CAPS: 18.0000 ug | ORAL_CAPSULE | Freq: Every day | RESPIRATORY_TRACT | Status: DC

## 2010-07-26 NOTE — Assessment & Plan Note (Addendum)
Moderate COPD, amplified by cardiac limitation and anemia. She is now feeling limited enough by exertional dyspnea that she is willing to again look at portable O2, noting she desaturated to 87% on last year.  We will also retry Spiriva

## 2010-07-26 NOTE — Progress Notes (Signed)
  Subjective:    Patient ID: Ellen [name], female    DOB: Jun 22, 1926, 77 y.o.   MRN: [id number]  HPI 07/26/10- 69 yo never smoker, followed for COPD, allergic rhinitis, complicated by CAD, renal insufficiency, anemia  and GERD Last here February 26, 2010- did have a bronchitis and an episode of gastroenteritis with dehydration, but those have resolved. Felt that pollen bothered her this Spring. Yesterday was more short of breath. Had gone to see Dr Smith/cardiology- she hurried to get there and lost her breath for awhile. He told her she was retaining some fluid. Hgb recently 10- given injection by Dr Myna Hidalgo. Wears oxygen for sleep at 2 L/M. We reviewed her done last year. She had portable O2 in the past but couldn't see well enough for it to be worthwhile and turned it in.  PFT 03/05/10- moderate obstruction FEV1/ FVC 0.65, insignif resp to BD. Unable to perform LV or DLCO.  Review of Systems Constitutional:   No weight loss, night sweats,  Fevers, chills, fatigue, lassitude. HEENT:   No headaches,  Difficulty swallowing,  Tooth/dental problems,  Sore throat,   Markedly impaired vision               No sneezing, itching, ear ache, nasal congestion, post nasal drip,   CV:  No chest pain,  Orthopnea, PND, , anasarca, dizziness, palpitations   Feet do swell  GI  No heartburn, indigestion, abdominal pain, nausea, vomiting, diarrhea, change in bowel habits, loss of appetite  Resp: .  No excess mucus, no productive cough,  No non-productive cough,  No coughing up of blood.  No change in color of mucus.  No wheezing.  Skin: no rash or lesions.  GU: no dysuria, change in color of urine, no urgency or frequency.  No flank pain.  MS:  No joint pain or swelling.  No decreased range of motion.  No back pain.  Psych:  No change in mood or affect. No depression or anxiety.  No memory loss.      Objective:   Physical Exam General- Alert, Oriented, Affect-appropriate, Distress- none acute  Skin-  rash-none, lesions- none, excoriation- none  Lymphadenopathy- none  Head- atraumatic  Eyes- , PERRLA, conjunctivae clear secretions  Ears- Hearing, canals, Tm - normal for age  Nose- Clear, No- Septal dev, mucus, polyps, erosion, perforation   Throat- Mallampati II , mucosa clear , drainage- none, tonsils- atrophic  Neck- flexible , trachea midline, no stridor , thyroid nl, carotid no bruit  Chest - symmetrical excursion , unlabored     Heart/CV- RRR , no murmur , no gallop  , no rub, nl s1 s2                     - JVD- none , edema- 1+, stasis changes- none, varices- none     Lung- clear to P&A, wheeze- none, cough- none , dullness-none, rub- none     Chest wall-  Abd- tender-no, distended-no, bowel sounds-present, HSM- no  Br/ Gen/ Rectal- Not done, not indicated  Extrem- cyanosis- none, clubbing, none, atrophy- none, strength- nl  Neuro- grossly intact to observation          Assessment & Plan:

## 2010-07-26 NOTE — Patient Instructions (Signed)
Sample and script for Spiriva -  1 daily  See if it helps shortness of breath   Refer [facility]- evaluate for light portable O2  2 L/M   Dx COPD

## 2010-07-31 ENCOUNTER — Encounter: Payer: Self-pay | Admitting: Internal Medicine

## 2010-07-31 NOTE — Assessment & Plan Note (Signed)
Multifactorial- COPD, Anemia, weakness left hemidiaphragm, hx cardiac disease.

## 2010-08-03 ENCOUNTER — Other Ambulatory Visit: Payer: Self-pay | Admitting: Internal Medicine

## 2010-08-03 DIAGNOSIS — R0602 Shortness of breath: Secondary | ICD-10-CM

## 2010-08-03 DIAGNOSIS — J449 Chronic obstructive pulmonary disease, unspecified: Secondary | ICD-10-CM

## 2010-09-06 ENCOUNTER — Other Ambulatory Visit: Payer: Self-pay | Admitting: Hematology & Oncology

## 2010-09-06 ENCOUNTER — Encounter (HOSPITAL_BASED_OUTPATIENT_CLINIC_OR_DEPARTMENT_OTHER): Payer: Medicare Other | Admitting: Hematology & Oncology

## 2010-09-06 DIAGNOSIS — D509 Iron deficiency anemia, unspecified: Secondary | ICD-10-CM

## 2010-09-06 DIAGNOSIS — D649 Anemia, unspecified: Secondary | ICD-10-CM

## 2010-09-06 DIAGNOSIS — N289 Disorder of kidney and ureter, unspecified: Secondary | ICD-10-CM

## 2010-09-06 LAB — CBC WITH DIFFERENTIAL (CANCER CENTER ONLY)
BASO%: 0.6 % (ref 0.0–2.0)
EOS%: 4.9 % (ref 0.0–7.0)
Eosinophils Absolute: 0.3 10*3/uL (ref 0.0–0.5)
LYMPH%: 25.4 % (ref 14.0–48.0)
MCH: 28 pg (ref 26.0–34.0)
MCHC: 33.6 g/dL (ref 32.0–36.0)
MCV: 83 fL (ref 81–101)
MONO%: 9.4 % (ref 0.0–13.0)
NEUT#: 4.1 10*3/uL (ref 1.5–6.5)
Platelets: 294 10*3/uL (ref 145–400)
RBC: 4.18 10*6/uL (ref 3.70–5.32)
RDW: 14.7 % (ref 11.1–15.7)

## 2010-09-06 LAB — RETICULOCYTES (CHCC)
RBC.: 4.26 MIL/uL (ref 3.87–5.11)
Retic Ct Pct: 1.1 % (ref 0.4–2.3)

## 2010-09-06 LAB — IRON AND TIBC
%SAT: 13 % — ABNORMAL LOW (ref 20–55)
Iron: 39 ug/dL — ABNORMAL LOW (ref 42–145)

## 2010-09-27 ENCOUNTER — Other Ambulatory Visit: Payer: Self-pay | Admitting: Internal Medicine

## 2010-09-27 DIAGNOSIS — J449 Chronic obstructive pulmonary disease, unspecified: Secondary | ICD-10-CM

## 2010-09-27 DIAGNOSIS — R0602 Shortness of breath: Secondary | ICD-10-CM

## 2010-10-04 ENCOUNTER — Encounter: Payer: Self-pay | Admitting: Internal Medicine

## 2010-11-08 ENCOUNTER — Other Ambulatory Visit: Payer: Self-pay | Admitting: Hematology & Oncology

## 2010-11-08 ENCOUNTER — Encounter (HOSPITAL_BASED_OUTPATIENT_CLINIC_OR_DEPARTMENT_OTHER): Payer: Medicare Other | Admitting: Hematology & Oncology

## 2010-11-08 DIAGNOSIS — D509 Iron deficiency anemia, unspecified: Secondary | ICD-10-CM

## 2010-11-08 DIAGNOSIS — N289 Disorder of kidney and ureter, unspecified: Secondary | ICD-10-CM

## 2010-11-08 DIAGNOSIS — Z23 Encounter for immunization: Secondary | ICD-10-CM

## 2010-11-08 DIAGNOSIS — D649 Anemia, unspecified: Secondary | ICD-10-CM

## 2010-11-08 LAB — CBC WITH DIFFERENTIAL (CANCER CENTER ONLY)
BASO#: 0 10*3/uL (ref 0.0–0.2)
EOS%: 4.7 % (ref 0.0–7.0)
HGB: 10.9 g/dL — ABNORMAL LOW (ref 11.6–15.9)
LYMPH%: 21.6 % (ref 14.0–48.0)
MCH: 28.3 pg (ref 26.0–34.0)
MCHC: 32.9 g/dL (ref 32.0–36.0)
MCV: 86 fL (ref 81–101)
MONO%: 9 % (ref 0.0–13.0)
NEUT#: 4.8 10*3/uL (ref 1.5–6.5)
NEUT%: 64.3 % (ref 39.6–80.0)

## 2010-11-08 LAB — CHCC SATELLITE - SMEAR

## 2010-11-08 LAB — RETICULOCYTES (CHCC): ABS Retic: 79.6 10*3/uL (ref 19.0–186.0)

## 2010-11-08 LAB — IRON AND TIBC: %SAT: 18 % — ABNORMAL LOW (ref 20–55)

## 2010-11-08 LAB — FERRITIN: Ferritin: 263 ng/mL (ref 10–291)

## 2010-11-15 ENCOUNTER — Encounter (HOSPITAL_BASED_OUTPATIENT_CLINIC_OR_DEPARTMENT_OTHER): Payer: Medicare Other | Admitting: Hematology & Oncology

## 2010-11-15 DIAGNOSIS — D509 Iron deficiency anemia, unspecified: Secondary | ICD-10-CM

## 2010-11-15 DIAGNOSIS — D649 Anemia, unspecified: Secondary | ICD-10-CM

## 2010-11-15 DIAGNOSIS — N289 Disorder of kidney and ureter, unspecified: Secondary | ICD-10-CM

## 2010-11-19 ENCOUNTER — Other Ambulatory Visit: Payer: Self-pay | Admitting: Internal Medicine

## 2010-11-19 DIAGNOSIS — Z1231 Encounter for screening mammogram for malignant neoplasm of breast: Secondary | ICD-10-CM

## 2010-12-13 ENCOUNTER — Ambulatory Visit: Payer: Medicare Other

## 2010-12-20 ENCOUNTER — Other Ambulatory Visit: Payer: Self-pay | Admitting: Hematology & Oncology

## 2010-12-20 ENCOUNTER — Encounter (HOSPITAL_BASED_OUTPATIENT_CLINIC_OR_DEPARTMENT_OTHER): Payer: Medicare Other | Admitting: Hematology & Oncology

## 2010-12-20 DIAGNOSIS — N289 Disorder of kidney and ureter, unspecified: Secondary | ICD-10-CM

## 2010-12-20 DIAGNOSIS — D649 Anemia, unspecified: Secondary | ICD-10-CM

## 2010-12-20 DIAGNOSIS — D509 Iron deficiency anemia, unspecified: Secondary | ICD-10-CM

## 2010-12-20 DIAGNOSIS — R197 Diarrhea, unspecified: Secondary | ICD-10-CM

## 2010-12-20 LAB — CBC WITH DIFFERENTIAL (CANCER CENTER ONLY)
BASO#: 0 10*3/uL (ref 0.0–0.2)
BASO%: 0.6 % (ref 0.0–2.0)
Eosinophils Absolute: 0.4 10*3/uL (ref 0.0–0.5)
HCT: 37.9 % (ref 34.8–46.6)
HGB: 12.7 g/dL (ref 11.6–15.9)
LYMPH#: 1.8 10*3/uL (ref 0.9–3.3)
MONO#: 0.8 10*3/uL (ref 0.1–0.9)
NEUT%: 57.7 % (ref 39.6–80.0)
RBC: 4.39 10*6/uL (ref 3.70–5.32)
WBC: 7 10*3/uL (ref 3.9–10.0)

## 2010-12-20 LAB — IRON AND TIBC
Iron: 62 ug/dL (ref 42–145)
TIBC: 288 ug/dL (ref 250–470)
UIBC: 226 ug/dL (ref 125–400)

## 2010-12-20 LAB — RETICULOCYTES (CHCC)
ABS Retic: 31.6 10*3/uL (ref 19.0–186.0)
RBC.: 4.52 MIL/uL (ref 3.87–5.11)

## 2010-12-23 NOTE — Progress Notes (Signed)
CC:   Ellen Showers, MD Ellen D. Maple Hudson, MD, FCCP, FACP Ellen [name], M.D. Ellen [name], M.D.  DIAGNOSES: 1. Anemia of renal insufficiency. 2. Intermittent iron deficiency anemia.  CURRENT THERAPY: 1. Aranesp 300 mcg subcu as needed for hemoglobin less than 11. 2. IV iron as indicated.  INTERIM HISTORY:  Ellen [name] comes in for followup.  She is not feeling well.  She is having a lot of diarrhea.  She has had it for about a month.  I think she will be seeing Dr. Ewing Schlein for this.  We did go ahead and give her iron back in September.  She had tolerated this well.  We also gave her a dose of Aranesp back in September.  Again, the diarrhea has been a problem for her.  She is really not taking anything much for it.  I told her to try some Kaopectate.  She says she has had cultures done.  She has had lab work done.  It does not sound like it is Clostridium infection.  PHYSICAL EXAMINATION:  General:  This is an elderly but well-nourished white female in no obvious distress.  Vital Signs:  Temperature 97.1, pulse 54, respiratory rate 18, blood pressure 145/40.  Weight is 173. Head/Neck:  Normocephalic, atraumatic skull.  There are no ocular or oral lesions.  She has no palpable cervical or supraclavicular lymph nodes.  Lungs:  Clear bilaterally.  Cardiac:  Regular rate and rhythm with a normal S1, S2.  She has no murmurs, rubs or bruits.  Abdomen: Soft.  Good bowel sounds.  There is no palpable abdominal mass.  There is no palpable hepatosplenomegaly.  She may be slightly "bloated." There is no guarding or rebound tenderness.  Extremities:  No clubbing, cyanosis or edema.  Neurologic:  Exam shows no focal neurological deficits.  LABORATORIES:  White blood cell count 7, hemoglobin 12.7, hematocrit 37.8, platelet count 236.  MCV is 86.  IMPRESSION:  Ellen [name] is an 77 year old white female with anemia of renal insufficiency.  Again, this is probably the best that her  blood counts have been.  I just wish she would feel better.  Hopefully, the diarrhea will resolve.  I forgot to mention that she is on a probiotic.  PLAN:  I will plan to get Ellen [name] back after the holidays now.  I just do not think we have to see her back beforehand.    ______________________________ Ellen [name], M.D. PRE/MEDQ  D:  12/20/2010  T:  12/23/2010  Job:  [id number]

## 2010-12-26 ENCOUNTER — Telehealth: Payer: Self-pay | Admitting: *Deleted

## 2010-12-26 NOTE — Telephone Encounter (Signed)
Left message with patient letting her know that her iron levels are much better

## 2011-01-25 ENCOUNTER — Ambulatory Visit: Payer: Medicare Other | Admitting: Internal Medicine

## 2011-02-21 ENCOUNTER — Other Ambulatory Visit: Payer: Self-pay | Admitting: Hematology & Oncology

## 2011-02-21 ENCOUNTER — Ambulatory Visit (HOSPITAL_BASED_OUTPATIENT_CLINIC_OR_DEPARTMENT_OTHER): Payer: Medicare Other | Admitting: Hematology & Oncology

## 2011-02-21 ENCOUNTER — Other Ambulatory Visit (HOSPITAL_BASED_OUTPATIENT_CLINIC_OR_DEPARTMENT_OTHER): Payer: Medicare Other | Admitting: Lab

## 2011-02-21 ENCOUNTER — Encounter: Payer: Self-pay | Admitting: Hematology & Oncology

## 2011-02-21 DIAGNOSIS — D649 Anemia, unspecified: Secondary | ICD-10-CM

## 2011-02-21 DIAGNOSIS — D509 Iron deficiency anemia, unspecified: Secondary | ICD-10-CM

## 2011-02-21 DIAGNOSIS — D631 Anemia in chronic kidney disease: Secondary | ICD-10-CM

## 2011-02-21 DIAGNOSIS — R42 Dizziness and giddiness: Secondary | ICD-10-CM

## 2011-02-21 LAB — IRON AND TIBC
%SAT: 23 % (ref 20–55)
Iron: 67 ug/dL (ref 42–145)
UIBC: 228 ug/dL (ref 125–400)

## 2011-02-21 LAB — CBC WITH DIFFERENTIAL (CANCER CENTER ONLY)
EOS%: 5.5 % (ref 0.0–7.0)
Eosinophils Absolute: 0.3 10*3/uL (ref 0.0–0.5)
LYMPH%: 26.3 % (ref 14.0–48.0)
MCH: 28.6 pg (ref 26.0–34.0)
MCHC: 32.3 g/dL (ref 32.0–36.0)
MCV: 88 fL (ref 81–101)
MONO%: 9 % (ref 0.0–13.0)
Platelets: 273 10*3/uL (ref 145–400)
RBC: 4.13 10*6/uL (ref 3.70–5.32)

## 2011-02-21 LAB — FERRITIN: Ferritin: 396 ng/mL — ABNORMAL HIGH (ref 10–291)

## 2011-02-21 LAB — CHCC SATELLITE - SMEAR

## 2011-02-21 NOTE — Progress Notes (Signed)
This office note has been dictated.

## 2011-02-22 NOTE — Progress Notes (Signed)
CC:   Clinton D. Maple Hudson, MD, FCCP, FACP Lyn Records, M.D. Petra Kuba, M.D.  DIAGNOSES: 1. Anemia of renal insufficiency. 2. Intermittent iron deficiency anemia. 3. Chronic dizziness.  CURRENT THERAPY: 1. Aranesp 300 mcg subcutaneous for hemoglobin less than 11. 2. IV iron as indicated.  INTERVAL HISTORY:  Ms. Clouatre comes in for followup.  She does not feel that well.  It is hard to say what is causing her problems.  She is still dizzy.  She has more in the way of confusion according to her daughter.  She just might be developing "hardening of the arteries" with cerebrovascular disease.  There has been no bleeding.  She has had no melena or bright red blood per rectum.  She last got iron I think back in the end of September.  She got Feraheme dose of 510 mg.  She last is got Aranesp also back in September.  She has had a good appetite.  There has been no nausea or vomiting.  She has had no cough.  There has been some dyspnea, which is chronic.  PHYSICAL EXAM:  General: This is an elderly white female in no obvious distress.  Vital signs: Temperature 97.6, pulse 55, respiratory rate is 18, blood pressure 174/59, and weight is 173.  Head and neck exam shows a normocephalic, atraumatic skull.  There are no ocular or oral lesions. She has no scleral icterus.  There is no adenopathy in her neck.  Her lungs are with some scattered crackles bilaterally.  Cardiac exam: Shows a slow but regular rate.  She has no murmurs, rubs, or bruits. Abdominal exam: Soft with good bowel sounds.  There is no fluid wave. There is no guarding or rebound tenderness.  There is no palpable hepatosplenomegaly.  Back exam: Shows no tenderness over the spine, ribs, or hips.  Extremities: Shows no clubbing, cyanosis, or edema. Neurological exam: Shows no focal neurological deficits.  LABORATORIES:  White cell count 5.9, hemoglobin 0.8, hematocrit 36.5, and platelet count 273.  MCV is 88.  IMPRESSION:   Ms. Boice is an 78 year old white female with multifactorial anemia.  Again, I just cannot imagine anemia being her issue with her symptoms.  Her heart rate is on the slow side.  She is on a beta-blocker.  I think that she needs to have her medications adjusted.  I also think she may need to go to a neurologist.  She may be developing some degree of dementia, from what her daughter is seen in her behavior.  She sees Dr. Clent Ridges next week.  We will certainly defer to Dr. Clent Ridges regarding her on additional workup.  I want to see Ms. Bills back in another couple of months.  She is on quite a few medications.  Again, one wonders if she may not be able to have some of the medications readjusted.    ______________________________ Josph Macho, M.D. PRE/MEDQ  D:  02/21/2011  T:  02/22/2011  Job:  [id number]   ADDENDUM: Ferritin is 392.  %Sat is 23.  Iron is 67

## 2011-03-29 ENCOUNTER — Telehealth: Payer: Self-pay | Admitting: Internal Medicine

## 2011-03-29 NOTE — Telephone Encounter (Signed)
Called  and spoke with pt and she stated that the nurse came out and did an eval on her and explained to the pt that she would do better with a liquid portable oxygen tank.  Pt is requesting that this be sent to [facility] .  CY please advise. thanks

## 2011-04-01 NOTE — Telephone Encounter (Signed)
Pt called back today and wants to "cancel" the order for liquid O2 if it has already been sent.  She says her pcp dr. Clent Ridges has already set this up for her. Ellen [name]

## 2011-04-02 NOTE — Telephone Encounter (Signed)
Will sign off on message.  Nothing further needed.

## 2011-04-18 ENCOUNTER — Other Ambulatory Visit (HOSPITAL_BASED_OUTPATIENT_CLINIC_OR_DEPARTMENT_OTHER): Payer: Medicare Other | Admitting: Lab

## 2011-04-18 ENCOUNTER — Ambulatory Visit (HOSPITAL_BASED_OUTPATIENT_CLINIC_OR_DEPARTMENT_OTHER): Payer: Medicare Other | Admitting: Hematology & Oncology

## 2011-04-18 DIAGNOSIS — D509 Iron deficiency anemia, unspecified: Secondary | ICD-10-CM

## 2011-04-18 DIAGNOSIS — Z9981 Dependence on supplemental oxygen: Secondary | ICD-10-CM

## 2011-04-18 DIAGNOSIS — D649 Anemia, unspecified: Secondary | ICD-10-CM

## 2011-04-18 DIAGNOSIS — D631 Anemia in chronic kidney disease: Secondary | ICD-10-CM

## 2011-04-18 DIAGNOSIS — N289 Disorder of kidney and ureter, unspecified: Secondary | ICD-10-CM

## 2011-04-18 DIAGNOSIS — N189 Chronic kidney disease, unspecified: Secondary | ICD-10-CM

## 2011-04-18 LAB — CBC WITH DIFFERENTIAL (CANCER CENTER ONLY)
BASO%: 0.5 % (ref 0.0–2.0)
LYMPH%: 27.3 % (ref 14.0–48.0)
MCV: 91 fL (ref 81–101)
MONO#: 0.7 10*3/uL (ref 0.1–0.9)
NEUT#: 3.7 10*3/uL (ref 1.5–6.5)
Platelets: 283 10*3/uL (ref 145–400)
RBC: 4.13 10*6/uL (ref 3.70–5.32)
RDW: 12.8 % (ref 11.1–15.7)
WBC: 6.7 10*3/uL (ref 3.9–10.0)

## 2011-04-18 LAB — IRON AND TIBC
Iron: 64 ug/dL (ref 42–145)
TIBC: 298 ug/dL (ref 250–470)

## 2011-04-18 LAB — FERRITIN: Ferritin: 455 ng/mL — ABNORMAL HIGH (ref 10–291)

## 2011-04-18 NOTE — Progress Notes (Signed)
This office note has been dictated.

## 2011-04-19 NOTE — Progress Notes (Signed)
CC:   Ellen D. Maple Hudson, MD, FCCP, FACP Ellen [name], M.D. Ellen [name], M.D.  DIAGNOSES: 1. Anemia of renal insufficiency. 2. Intermittent iron-deficiency anemia.  CURRENT THERAPY: 1. Aranesp 300 mcg subcutaneously as needed for hemoglobin less than     11. 2. IV iron as indicated.  INTERIM HISTORY:  Ellen [name] comes in for followup.  She is on oxygen now.  It sounds like her heart or, possibly, lungs are having issues. She was put on oxygen mostly at night, but now is on oxygen during the daytime.  She thinks that it might be making her a little bit better.  She has been having some chest discomfort.  She sees Dr. Garnette Scheuermann of Cardiology.  He wants her to keep a record of her episodes and having her take nitroglycerin.  When we last saw her, her ferritin was 396.  Her iron saturation was 23%. She last got iron back in September.  She last got Aranesp also back in September.  Her appetite is okay.  Her memory has not been doing all that well, which may be reflective of a low oxygen state.  She continues on quite a few medications.  PHYSICAL EXAMINATION:  General Appearance:  This is an elderly, white female in no obvious distress.  Vital Signs:  Temperature 97.6.  Pulse 57.  Respiratory rate 18.  Blood pressure 174/59.  Weight is 173.  Head and Neck Exam:  A normocephalic, atraumatic skull.  There are no ocular or oral lesions.  There are no palpable cervical or supraclavicular lymph nodes.  Lungs:  Some crackles over on the left side.  She has decreased air movement on the left side compared to the right side.  No wheezes are noted.  Cardiac Exam:  Regular rate and rhythm with a normal S1 and S2.  There are no murmurs, rubs, or bruits.  Abdominal Exam: Soft with good bowel sounds.  There is no palpable abdominal mass. There is no fluid wave.  There is no palpable hepatosplenomegaly.  Back Exam:  No tenderness over the spine, ribs, or hips.  Extremities:  No clubbing,  cyanosis, or edema.  There may be some trace edema in her lower legs.  Skin Exam:  No rash, ecchymosis, or petechia.  LABORATORY STUDIES:  White cell count 6.7, hemoglobin 12.2, hematocrit 37.6, platelet count 283.  MCV is 91.  IMPRESSION:  Ellen [name] is an 78 year old white female with multifactorial anemia.  She has anemia of renal insufficiency.  She has anemia secondary to iron deficiency.  For now, I could not imagine her having iron deficiency as a problem.  We will go ahead and plan to get her back to see [place] in another couple months or so for followup.  I hope that her overall performance status and quality of life improve with the oxygen.    ______________________________ Ellen [name], M.D. PRE/MEDQ  D:  04/18/2011  T:  04/18/2011  Job:  [id number]

## 2011-04-22 ENCOUNTER — Telehealth: Payer: Self-pay | Admitting: *Deleted

## 2011-04-22 NOTE — Telephone Encounter (Signed)
Message copied by Anselm Jungling on Mon Apr 22, 2011 10:10 AM ------      Message from: Josph Macho      Created: Sun Apr 21, 2011  8:33 PM       Cal dgtr - iron is ok.  pete

## 2011-04-22 NOTE — Telephone Encounter (Signed)
Called patients daughter to let her know that her moms  Iron levels are good per dr. Myna Hidalgo. Left message on personal voice mail

## 2011-04-23 ENCOUNTER — Ambulatory Visit (INDEPENDENT_AMBULATORY_CARE_PROVIDER_SITE_OTHER): Payer: Medicare Other | Admitting: Internal Medicine

## 2011-04-23 ENCOUNTER — Encounter: Payer: Self-pay | Admitting: Internal Medicine

## 2011-04-23 VITALS — BP 148/72 | HR 72 | Ht 61.0 in | Wt 178.4 lb

## 2011-04-23 DIAGNOSIS — J4489 Other specified chronic obstructive pulmonary disease: Secondary | ICD-10-CM

## 2011-04-23 DIAGNOSIS — J449 Chronic obstructive pulmonary disease, unspecified: Secondary | ICD-10-CM

## 2011-04-23 NOTE — Progress Notes (Signed)
Patient ID: Ellen [name], female    DOB: 07/27/1926, 78 y.o.   MRN: [id number]  HPI 07/26/10- 4 yo never smoker, followed for COPD, allergic rhinitis, complicated by CAD, renal insufficiency, anemia  and GERD Last here February 26, 2010- did have a bronchitis and an episode of gastroenteritis with dehydration, but those have resolved. Felt that pollen bothered her this Spring. Yesterday was more short of breath. Had gone to see Dr Smith/cardiology- she hurried to get there and lost her breath for awhile. He told her she was retaining some fluid. Hgb recently 10- given injection by Dr Myna Hidalgo. Wears oxygen for sleep at 2 L/M. We reviewed her done last year. She had portable O2 in the past but couldn't see well enough for it to be worthwhile and turned it in.  PFT 03/05/10- moderate obstruction FEV1/ FVC 0.65, insignif resp to BD. Unable to perform LV or DLCO.  04/23/11- 32 yo never smoker, followed for COPD, allergic rhinitis, complicated by CAD, renal insufficiency, anemia  and GERD Daughter is here. Not breathing well. [facility] portable pulse oxygen. Some mild cough with occasional scant, thick, white sputum. Had chest pain last week and evaluated by Dr. Smith/cardiology and she understands he felt she was okay. Noticing some confusion, loss of memory. Her primary physician asked her to use oxygen more consistently. Her hematologist is watching chronic anemia. Can't afford Spiriva.  ROS-see HPI Constitutional:   No-   weight loss, night sweats, fevers, chills, fatigue, lassitude. HEENT:   No-  headaches, difficulty swallowing, tooth/dental problems, sore throat,       No-  sneezing, itching, ear ache, nasal congestion, post nasal drip,  CV:  No-   chest pain, orthopnea, PND, swelling in lower extremities, dizziness, palpitations Resp: +  shortness of breath with exertion or at rest.              +   productive cough,  No non-productive cough,  No- coughing up of blood.                No-   change in color of mucus.  No- wheezing.   Skin: No-   rash or lesions. GI:  No-   heartburn, indigestion, abdominal pain, nausea, vomiting GU:  MS:  No-   joint pain or swelling.   Neuro-     nothing unusual Psych:  No- change in mood or affect. No depression or anxiety.  No memory loss.       OBJ- Physical Exam General- Alert, Oriented, Affect-appropriate, Distress- none acute, O2 sat 95% on 2 L pulsed. Skin- rash-none, lesions- none, excoriation- none Lymphadenopathy- none Head- atraumatic            Eyes- Gross vision intact, PERRLA, conjunctivae and secretions clear            Ears- Hearing, canals-normal            Nose- Clear, no-Septal dev, mucus, polyps, erosion, perforation             Throat- Mallampati II , mucosa clear , drainage- none, tonsils- atrophic Neck- flexible , trachea midline, no stridor , thyroid nl, carotid no bruit Chest - symmetrical excursion , unlabored           Heart/CV- RRR , no murmur , no gallop  , no rub, nl s1 s2                           -  JVD- none , edema- none, stasis changes- none, varices- none           Lung- clear to P&A, wheeze- none, cough- none , dullness-none, rub- none           Chest wall-  Abd-  Br/ Gen/ Rectal- Not done, not indicated Extrem- cyanosis- none, clubbing, none, atrophy- none, strength- nl. Cool hands Neuro- grossly intact to observation

## 2011-04-23 NOTE — Patient Instructions (Signed)
Sample Spiriva  1 daily  When exerting yourself, ok to turn up the Oxygen to 3 or 4 L/M, but turn it back down to 2 L when resting.

## 2011-04-27 NOTE — Assessment & Plan Note (Addendum)
Question mild bronchitic exacerbation. Chronic hypoxic respiratory failure. Anemia and cardiac disease would be expected to aggravate her pulmonary dyspnea. Plan-educated on oxygen use. Increase portable oxygen to 3 or 4 L per minute as needed. Try Spiriva

## 2011-04-29 ENCOUNTER — Telehealth: Payer: Self-pay | Admitting: *Deleted

## 2011-04-29 NOTE — Telephone Encounter (Signed)
Called patient to let her know that her iron levels were good per dr. enenver 

## 2011-04-29 NOTE — Telephone Encounter (Signed)
Message copied by Anselm Jungling on Mon Apr 29, 2011 11:25 AM ------      Message from: Josph Macho      Created: Sun Apr 21, 2011  8:33 PM       Cal dgtr - iron is ok.  pete

## 2011-06-13 ENCOUNTER — Other Ambulatory Visit (HOSPITAL_BASED_OUTPATIENT_CLINIC_OR_DEPARTMENT_OTHER): Payer: Medicare Other | Admitting: Lab

## 2011-06-13 ENCOUNTER — Ambulatory Visit (HOSPITAL_BASED_OUTPATIENT_CLINIC_OR_DEPARTMENT_OTHER): Payer: Medicare Other | Admitting: Hematology & Oncology

## 2011-06-13 VITALS — BP 129/57 | HR 59 | Temp 97.6°F | Ht 61.0 in | Wt 180.0 lb

## 2011-06-13 DIAGNOSIS — D649 Anemia, unspecified: Secondary | ICD-10-CM

## 2011-06-13 DIAGNOSIS — N289 Disorder of kidney and ureter, unspecified: Secondary | ICD-10-CM

## 2011-06-13 DIAGNOSIS — D509 Iron deficiency anemia, unspecified: Secondary | ICD-10-CM

## 2011-06-13 DIAGNOSIS — D631 Anemia in chronic kidney disease: Secondary | ICD-10-CM

## 2011-06-13 LAB — CBC WITH DIFFERENTIAL (CANCER CENTER ONLY)
BASO#: 0 10*3/uL (ref 0.0–0.2)
Eosinophils Absolute: 0.4 10*3/uL (ref 0.0–0.5)
HGB: 11.7 g/dL (ref 11.6–15.9)
LYMPH%: 30.2 % (ref 14.0–48.0)
MCH: 29.3 pg (ref 26.0–34.0)
MCV: 91 fL (ref 81–101)
MONO#: 0.6 10*3/uL (ref 0.1–0.9)
MONO%: 10.4 % (ref 0.0–13.0)
NEUT#: 3.2 10*3/uL (ref 1.5–6.5)
RBC: 3.99 10*6/uL (ref 3.70–5.32)
WBC: 6.1 10*3/uL (ref 3.9–10.0)

## 2011-06-13 NOTE — Progress Notes (Signed)
This office note has been dictated.

## 2011-06-14 NOTE — Progress Notes (Signed)
CC:   Lyn Records, M.D. Petra Kuba, M.D. Clinton D. Maple Hudson, MD, FCCP, FACP  DIAGNOSES: 1. Anemia of renal insufficiency. 2. Intermittent iron deficiency anemia.  CURRENT THERAPY: 1. Aranesp 300 mcg subcu as needed for hemoglobin less than 11. 2. IV iron as indicated.  INTERVAL HISTORY:  Ellen [name] comes in for follow-up.  She is doing okay. She is still on oxygen.  Her congestive heart failure really is her main issue right now.  We have not had to give her Aranesp for quite a while.  She got Aranesp and IV iron back in September.  When we last saw her, her ferritin was 445.  Her iron saturation was 21%.  She has had no change in bowel or bladder habits.  There has been occasional swelling in her legs.  She has had a little bit of a cough. She really does not get out that much.  PHYSICAL EXAM:  This is a well-developed well-nourished white female in no obvious distress.  Vital signs:  Temperature of 97.6, pulse 56, respiratory rate 20, blood pressure 129/57, weight is 180.  Head/Neck: Normocephalic, atraumatic skull.  There are no ocular or oral lesions. There are no palpable cervical or supraclavicular lymph nodes.  Lungs: Some decrease at the bases.  Cardiac:  Regular rate and rhythm with a normal S1, S2.  There are no murmurs, rubs or bruits.  Abdomen:  Soft, mildly obese.  She has good bowel sounds.  There is no palpable abdominal mass.  There is no fluid wave.  There is no palpable hepatosplenomegaly.  Back:  No tenderness over the spine, ribs, or hips. Extremities:  No clubbing, cyanosis or edema.  Neurological:  No focal neurological deficits.  LABORATORY STUDIES:  White cell count is 6.1, hemoglobin 11.7, hematocrit 36.4, platelet count 266.  IMPRESSION:  Ellen [name] is an 78 year old female with anemia of renal insufficiency.  She also has intermittent iron deficiency anemia.  Her hemoglobin is dropping just a little bit.  We will probably have to give her a  dose of Aranesp within 3-4 months.  I will plan to get her back in another 2 months.  By then, she may need to have Aranesp and/or iron.    ______________________________ Josph Macho, M.D. PRE/MEDQ  D:  06/13/2011  T:  06/14/2011  Job:  [id number]

## 2011-06-18 LAB — IRON AND TIBC
%SAT: 25 % (ref 20–55)
Iron: 77 ug/dL (ref 42–145)
TIBC: 307 ug/dL (ref 250–470)

## 2011-06-18 LAB — TRANSFERRIN RECEPTOR, SOLUABLE: Transferrin Receptor, Soluble: 1.33 mg/L (ref 0.76–1.76)

## 2011-06-18 LAB — RETICULOCYTES (CHCC)
ABS Retic: 44.2 10*3/uL (ref 19.0–186.0)
Retic Ct Pct: 1.1 % (ref 0.4–2.3)

## 2011-06-18 LAB — FERRITIN: Ferritin: 324 ng/mL — ABNORMAL HIGH (ref 10–291)

## 2011-08-01 ENCOUNTER — Other Ambulatory Visit (HOSPITAL_BASED_OUTPATIENT_CLINIC_OR_DEPARTMENT_OTHER): Payer: Medicare Other | Admitting: Lab

## 2011-08-01 ENCOUNTER — Ambulatory Visit (HOSPITAL_BASED_OUTPATIENT_CLINIC_OR_DEPARTMENT_OTHER): Payer: Medicare Other | Admitting: Hematology & Oncology

## 2011-08-01 VITALS — BP 124/59 | HR 61 | Temp 97.4°F | Ht 61.0 in | Wt 183.0 lb

## 2011-08-01 DIAGNOSIS — D509 Iron deficiency anemia, unspecified: Secondary | ICD-10-CM

## 2011-08-01 DIAGNOSIS — N289 Disorder of kidney and ureter, unspecified: Secondary | ICD-10-CM

## 2011-08-01 DIAGNOSIS — D631 Anemia in chronic kidney disease: Secondary | ICD-10-CM

## 2011-08-01 DIAGNOSIS — I501 Left ventricular failure: Secondary | ICD-10-CM

## 2011-08-01 DIAGNOSIS — D649 Anemia, unspecified: Secondary | ICD-10-CM

## 2011-08-01 DIAGNOSIS — N189 Chronic kidney disease, unspecified: Secondary | ICD-10-CM

## 2011-08-01 DIAGNOSIS — I509 Heart failure, unspecified: Secondary | ICD-10-CM

## 2011-08-01 LAB — CBC WITH DIFFERENTIAL (CANCER CENTER ONLY)
Eosinophils Absolute: 0.4 10*3/uL (ref 0.0–0.5)
LYMPH#: 1.7 10*3/uL (ref 0.9–3.3)
LYMPH%: 23.9 % (ref 14.0–48.0)
MCV: 90 fL (ref 81–101)
MONO#: 0.8 10*3/uL (ref 0.1–0.9)
NEUT#: 4.2 10*3/uL (ref 1.5–6.5)
Platelets: 260 10*3/uL (ref 145–400)
RBC: 3.83 10*6/uL (ref 3.70–5.32)
WBC: 7.3 10*3/uL (ref 3.9–10.0)

## 2011-08-01 NOTE — Progress Notes (Signed)
This office note has been dictated.

## 2011-08-02 NOTE — Progress Notes (Signed)
CC:   Clinton D. Maple Hudson, MD, FCCP, FACP Lyn Records, M.D. Petra Kuba, M.D.  DIAGNOSES: 1. Anemia of renal insufficiency. 2. Intermittent iron deficiency anemia.  CURRENT THERAPY: 1. Aranesp 300 mcg subcu as needed for hemoglobin less than 11. 2. IV iron as indicated.  INTERIM HISTORY:  Ellen [name] comes in for followup.  Unfortunately, she broke her left foot.  She did this at a wedding.  She has a walking boot on.  She is on supplemental oxygen.  She has underlying cardiovascular disease.  She has congestive heart failure.  She really cannot lay down flat.  She has not gotten Aranesp for a good 6 months or so.  She has not had iron for about 8 months.  There has been no obvious bleeding.  She does feel dyspneic.  I did check her oxygen level on 2 L of nasal cannula oxygen.  The oxygen saturation was 96%.  When we last saw her in April, her ferritin was 324.  Her iron saturation was 25%.  PHYSICAL EXAMINATION:  This is an elderly white female in no obvious distress.  Vital signs:  Temperature of 97.5, pulse 61, respiratory rate 18, blood pressure 124/59.  Weight is 183.  Head and neck: Normocephalic, atraumatic skull.  There are no ocular or oral lesions. There are no palpable cervical or supraclavicular lymph nodes.  Lungs: With crackles bilaterally.  She has decreased breath sounds throughout her left lung field.  Right lung field is relatively clear.  Cardiac: Regular rate and rhythm with normal S1, S2.  She has a 1/6 systolic ejection murmur.  Abdomen:  Soft, mildly obese.  She has good bowel sounds.  There is no fluid wave.  There is no palpable hepatosplenomegaly.  Back:  No tenderness over the spine, ribs, or hips. Extremities:  No clubbing, cyanosis or edema.  Neurologic: No focal neurological deficits.  LABORATORY STUDIES:  White cell count 7.3, hemoglobin 11.3, hematocrit 34.6, platelet count 260.  MCV is 90.  IMPRESSION:  [name] is an 78 year old female  with worsening congestive heart failure.  She has anemia of renal insufficiency.  She also has, to some degree, iron deficiency.  Her hemoglobin is trending downward.  I do suspect that we are probably going to need Aranesp on her when we see her next.  I think we are going to have to get her back in 1 month.  I will be checking her blood counts at that point in time.    ______________________________ Josph Macho, M.D. PRE/MEDQ  D:  08/01/2011  T:  08/02/2011  Job:  [id number]

## 2011-08-06 ENCOUNTER — Telehealth: Payer: Self-pay | Admitting: Hematology & Oncology

## 2011-08-06 LAB — IRON AND TIBC
%SAT: 17 % — ABNORMAL LOW (ref 20–55)
Iron: 51 ug/dL (ref 42–145)
TIBC: 303 ug/dL (ref 250–470)
UIBC: 252 ug/dL (ref 125–400)

## 2011-08-06 LAB — FERRITIN: Ferritin: 393 ng/mL — ABNORMAL HIGH (ref 10–291)

## 2011-08-06 NOTE — Telephone Encounter (Addendum)
Message copied by Cathi Roan on Tue Aug 06, 2011  3:15 PM ------      Message from: Josph Macho      Created: Sun Aug 04, 2011  8:56 PM       Call dgtr - mom's iron studies are ok.  Cindee Lame  08-06-11 3:16 pm, Called patient to inform her of above lab studies per MD.   Pt stated she had recently fell and broke her foot, she has questions about vitamins and will call us back regarding which to ones to take."needs to get her paper work first"

## 2011-08-13 ENCOUNTER — Telehealth: Payer: Self-pay | Admitting: Hematology & Oncology

## 2011-08-13 NOTE — Telephone Encounter (Signed)
Pt called and cx 08/30/11 apt and resch for 08/28/11

## 2011-08-23 ENCOUNTER — Encounter: Payer: Self-pay | Admitting: Internal Medicine

## 2011-08-23 ENCOUNTER — Ambulatory Visit (INDEPENDENT_AMBULATORY_CARE_PROVIDER_SITE_OTHER): Payer: Medicare Other | Admitting: Internal Medicine

## 2011-08-23 ENCOUNTER — Ambulatory Visit (INDEPENDENT_AMBULATORY_CARE_PROVIDER_SITE_OTHER)
Admission: RE | Admit: 2011-08-23 | Discharge: 2011-08-23 | Disposition: A | Discharge status: Home / Self Care | Payer: Medicare Other | Source: Ambulatory Visit | Attending: Internal Medicine | Admitting: Internal Medicine

## 2011-08-23 ENCOUNTER — Other Ambulatory Visit (INDEPENDENT_AMBULATORY_CARE_PROVIDER_SITE_OTHER): Payer: Medicare Other

## 2011-08-23 VITALS — HR 64 | Ht 61.0 in | Wt 182.6 lb

## 2011-08-23 DIAGNOSIS — J449 Chronic obstructive pulmonary disease, unspecified: Secondary | ICD-10-CM

## 2011-08-23 DIAGNOSIS — R0602 Shortness of breath: Secondary | ICD-10-CM

## 2011-08-23 LAB — BASIC METABOLIC PANEL
GFR: 83.47 mL/min (ref >60.00)
Glucose, Bld: 126 mg/dL — ABNORMAL HIGH (ref 70–99)
Potassium: 4.2 mEq/L (ref 3.5–5.1)
Sodium: 135 mEq/L (ref 135–145)

## 2011-08-23 NOTE — Progress Notes (Signed)
Patient ID: Ellen [name], female    DOB: 02/14/1927, 78 y.o.   MRN: [id number]  HPI 07/26/10- 21 yo never smoker, followed for COPD, allergic rhinitis, complicated by CAD, renal insufficiency, anemia  and GERD Last here February 26, 2010- did have a bronchitis and an episode of gastroenteritis with dehydration, but those have resolved. Felt that pollen bothered her this Spring. Yesterday was more short of breath. Had gone to see Dr Smith/cardiology- she hurried to get there and lost her breath for awhile. He told her she was retaining some fluid. Hgb recently 10- given injection by Dr Myna Hidalgo. Wears oxygen for sleep at 2 L/M. We reviewed her done last year. She had portable O2 in the past but couldn't see well enough for it to be worthwhile and turned it in.  PFT 03/05/10- moderate obstruction FEV1/ FVC 0.65, insignif resp to BD. Unable to perform LV or DLCO.  04/23/11- 57 yo never smoker, followed for COPD, allergic rhinitis, complicated by CAD, renal insufficiency, anemia  and GERD Daughter is here. Not breathing well. [facility] light portable pulse oxygen. Some mild cough with occasional scant, thick, white sputum. Had chest pain last week and evaluated by Dr. Smith/cardiology and she understands he felt she was okay. Noticing some confusion, loss of memory. Her primary physician asked her to use oxygen more consistently. Her hematologist is watching chronic anemia. Can't afford Spiriva.  08/23/11- 59 yo never smoker, followed for COPD, allergic rhinitis, complicated by CAD, renal insufficiency, anemia  and GERD Not breathing well today-been more active; having increased SOB Increased shortness of breath for several weeks. No distinct infection but she can't rule out a mild cold at the beginning. Loose cough and hacking. She took some Lasix recently put her feet started swelling.  ROS-see HPI Constitutional:   No-   weight loss, night sweats, fevers, chills, fatigue,  lassitude. HEENT:   No-  headaches, difficulty swallowing, tooth/dental problems, sore throat,       No-  sneezing, itching, ear ache, nasal congestion, post nasal drip,  CV:  No-   chest pain, orthopnea, PND, swelling in lower extremities, dizziness, palpitations Resp: +  shortness of breath with exertion or at rest.              +   productive cough,  + non-productive cough,  No- coughing up of blood.              No-   change in color of mucus.  No- wheezing.   Skin: No-   rash or lesions. GI:  No-   heartburn, indigestion, abdominal pain, nausea, vomiting GU:  MS:  No-   joint pain or swelling.   Neuro-     nothing unusual Psych:  No- change in mood or affect. No depression or anxiety.  No memory loss.  OBJ- Physical Exam General- Alert, Oriented, Affect-appropriate, Distress- none acute, O2 sat 95% on 2 L pulsed. Skin- rash-none, lesions- none, excoriation- none Lymphadenopathy- none Head- atraumatic            Eyes- Gross vision intact, PERRLA, conjunctivae and secretions clear            Ears- Hearing, canals-normal            Nose- Clear, no-Septal dev, mucus, polyps, erosion, perforation             Throat- Mallampati II , mucosa clear , drainage- none, tonsils- atrophic Neck- flexible , trachea midline, no  stridor , thyroid nl, carotid no bruit Chest - symmetrical excursion , unlabored           Heart/CV- RRR , no murmur , no gallop  , no rub, nl s1 s2                           - JVD+ 1 cm , edema- none, stasis changes- none, varices- none           Lung- soft crackles L>R, wheeze- none, cough- none , dullness-none, rub- none           Chest wall-  Abd-  Br/ Gen/ Rectal- Not done, not indicated Extrem- cyanosis- none, clubbing, none, atrophy- none, strength- nl.+ Broken left foot in brace Neuro- grossly intact to observation

## 2011-08-23 NOTE — Patient Instructions (Addendum)
Order- CXR- dx dyspnea              Lab- BMET, BNP     Dx dyspnea

## 2011-08-26 NOTE — Assessment & Plan Note (Signed)
Recent exacerbation, nonspecific but possibly viral and weather related. Rule out CHF, fluid overload  Plan-chest x-ray, chemistry, BNP

## 2011-08-28 ENCOUNTER — Ambulatory Visit (HOSPITAL_BASED_OUTPATIENT_CLINIC_OR_DEPARTMENT_OTHER): Payer: Medicare Other | Admitting: Medical

## 2011-08-28 ENCOUNTER — Ambulatory Visit: Payer: Medicare Other

## 2011-08-28 ENCOUNTER — Other Ambulatory Visit (HOSPITAL_BASED_OUTPATIENT_CLINIC_OR_DEPARTMENT_OTHER): Payer: Medicare Other | Admitting: Lab

## 2011-08-28 VITALS — BP 132/50 | HR 61 | Temp 97.3°F | Ht 61.0 in | Wt 181.0 lb

## 2011-08-28 DIAGNOSIS — D509 Iron deficiency anemia, unspecified: Secondary | ICD-10-CM

## 2011-08-28 DIAGNOSIS — D649 Anemia, unspecified: Secondary | ICD-10-CM

## 2011-08-28 DIAGNOSIS — N289 Disorder of kidney and ureter, unspecified: Secondary | ICD-10-CM

## 2011-08-28 LAB — CBC WITH DIFFERENTIAL (CANCER CENTER ONLY)
BASO#: 0 10*3/uL (ref 0.0–0.2)
EOS%: 6.9 % (ref 0.0–7.0)
Eosinophils Absolute: 0.4 10*3/uL (ref 0.0–0.5)
HCT: 34.2 % — ABNORMAL LOW (ref 34.8–46.6)
HGB: 11.2 g/dL — ABNORMAL LOW (ref 11.6–15.9)
LYMPH#: 1.7 10*3/uL (ref 0.9–3.3)
MCHC: 32.7 g/dL (ref 32.0–36.0)
MONO#: 0.6 10*3/uL (ref 0.1–0.9)
NEUT%: 53.2 % (ref 39.6–80.0)
RBC: 3.83 10*6/uL (ref 3.70–5.32)

## 2011-08-28 LAB — FERRITIN: Ferritin: 308 ng/mL — ABNORMAL HIGH (ref 10–291)

## 2011-08-28 LAB — IRON AND TIBC
%SAT: 16 % — ABNORMAL LOW (ref 20–55)
TIBC: 286 ug/dL (ref 250–470)

## 2011-08-28 NOTE — Progress Notes (Signed)
Patient Name : Ellen [name], Ellen [name] MR #[id number] DOB: 06/20/1926 Encounter Date: 08/28/2011 Dictated by [name], PA-C  Diagnoses: #1 anemia of renal insufficiency. #2 intermittent iron deficiency anemia.  Current therapy: #1 Aranesp 300 mcg subcutaneous as needed.  For hemoglobin less than 11. #2 IV iron as indicated.  Interim history: Ellen [name] presents today for an office followup visit.  Her daughter accompanies her.  Overall, Ellen [name] reports, that she's been doing relatively well.  She recently saw her pulmonologist, who has had her increase her oxygen from 2 L up to 3 or 4 L as needed.  Her oxygen saturation today was 93% on room air.  She reports that she continues to feel sluggish.  However, I think a lot of this has to do with her underlying comorbidities.  Again, she has cardiovascular disease, as well as congestive heart failure.  Her blood work looks good today.  Her hemoglobin is 11.2, her last ferritin back in June was 393.  Iron 51, TIBC 303, with 17% saturation.  Since her hemoglobin is above 11.  She will not need an Aranesp injection today.  There is no indication for any IV iron as well.  She denies any type of nausea, vomiting, diarrhea, constipation, chest pain.  She does have some dyspnea on exertion.  She denies any obvious bleeding.  She does have some lower extremity edema, but utilizes Lasix as needed.  Review of Systems: Significant for fatigue and DOE, otherwise,  Pt. Denies any changes in their vision, hearing, adenopathy, fevers, chills, nausea, vomiting, diarrhea, constipation, chest pain, shortness of breath, passing blood, passing out, blacking out,  any changes in skin, joints, neurologic or psychiatric except as noted.  Physical Exam:  This is a pleasant, 78 year old, female, in no obvious distress Vitals: Temperature 97.6 degrees.  Pulse 61, respirations 20, blood pressure is 132/50 weight 181 pounds HEENT reveals a normocephalic, atraumatic skull, no scleral  icterus, no oral lesions  Neck is supple without any cervical or supraclavicular adenopathy.  Lungs are clear to auscultation bilaterally.  Cardiac is regular rate and rhythm with a normal S1 and S2. There are no murmurs, rubs, or bruits.  Abdomen is soft with good bowel sounds there's a palpable mass. There is no palpable hepatosplenomegaly. There is no palpable fluid wave.  Musculoskeletal no tenderness of the spine, ribs, or hips.  Extremities there are no clubbing, cyanosis,+ trace b/l lower extremity edema Skin no petechia, purpura or ecchymosis Neurologic is nonfocal.  Laboratory Data: White count 6.0, hemoglobin 11.2, hematocrit 34.2, platelets 252,000 Iron panel pending from today  Current Outpatient Prescriptions on File Prior to Visit  Medication Sig Dispense Refill  . ALPRAZolam (XANAX) 0.25 MG tablet Take 0.25 mg by mouth at bedtime as needed.        [name] aspirin 81 MG tablet Take 81 mg by mouth daily.        . calcium gluconate 500 MG tablet Take 500 mg by mouth 2 (two) times daily.       . cholecalciferol (VITAMIN D) 1000 UNITS tablet Take 2,000 Units by mouth 2 (two) times daily.       [name] esomeprazole (NEXIUM) 40 MG capsule Take 40 mg by mouth daily before breakfast.        . fexofenadine (ALLEGRA) 60 MG tablet Take 60 mg by mouth daily.        . fish oil-omega-3 fatty acids 1000 MG capsule Take 1 g by mouth daily.        [name]  furosemide (LASIX) 80 MG tablet Take 1 tablet by mouth Once daily as needed.      [name] gentamicin (GARAMYCIN) 0.3 % ophthalmic solution every morning.      [name] glucosamine-chondroitin 500-400 MG tablet Take 1 tablet by mouth 2 (two) times daily.        . Grape Seed 50 MG TABS Take by mouth 2 (two) times daily.      [name] losartan (COZAAR) 50 MG tablet Take 50 mg by mouth daily.        . Multiple Vitamin (MULTIVITAMIN) capsule Take 1 capsule by mouth daily.        . nadolol (CORGARD) 80 MG tablet Take 80 mg by mouth daily.        . nitroGLYCERIN (NITROSTAT) 0.4 MG  SL tablet Place 0.4 mg under the tongue every 5 (five) minutes as needed.        . pravastatin (PRAVACHOL) 80 MG tablet Take 1 tablet by mouth Daily.      . sodium chloride (OCEAN) 0.65 % nasal spray 1 spray by Nasal route as needed.        . traMADol (ULTRAM) 50 MG tablet Take 50 mg by mouth daily.         Impression/Plan: This is a pleasant, 78 year old female with the following issues. #1 anemia of renal insufficiency-overall, her hemoglobin is remaining stable.  She does not need an Aranesp injection today.  We will continue to monitor her blood work. #2 iron deficiency anemia-she has not required IV iron for about 9 months now.  We will continue to monitor her iron levels. #3 congestive heart failure-she will continue to followup with her dedicated physicians #4.  Followup-Ellen [name] will follow back up with [place] in one month, but before then should there be questions or concerns

## 2011-08-30 ENCOUNTER — Ambulatory Visit: Payer: Medicare Other | Admitting: Hematology & Oncology

## 2011-08-30 ENCOUNTER — Other Ambulatory Visit: Payer: Medicare Other | Admitting: Lab

## 2011-08-30 ENCOUNTER — Ambulatory Visit: Payer: Medicare Other

## 2011-08-30 NOTE — Progress Notes (Signed)
Quick Note:  Pt aware of results. ______ 

## 2011-09-09 ENCOUNTER — Other Ambulatory Visit: Payer: Self-pay | Admitting: *Deleted

## 2011-09-09 ENCOUNTER — Telehealth: Payer: Self-pay | Admitting: *Deleted

## 2011-09-09 DIAGNOSIS — D509 Iron deficiency anemia, unspecified: Secondary | ICD-10-CM

## 2011-09-09 NOTE — Telephone Encounter (Signed)
Message copied by Anselm Jungling on Mon Sep 09, 2011 11:36 AM ------      Message from: Ellen [name]      Created: Tue Sep 03, 2011  1:18 PM       Call her dgtr - iron is dropping again. Need feraheme 1020mg  x 1 dose at her convenience.  Cindee Lame

## 2011-09-09 NOTE — Telephone Encounter (Signed)
Called patient to let her know that her iron levels were dropping again per dr. Myna Hidalgo. Needs one dose of Feraheme 1020 mg IV .  Patient will contact [facility] when she can get a ride with her daughter.

## 2011-09-10 ENCOUNTER — Ambulatory Visit (HOSPITAL_BASED_OUTPATIENT_CLINIC_OR_DEPARTMENT_OTHER): Payer: Medicare Other

## 2011-09-10 VITALS — BP 125/56 | HR 61 | Temp 97.6°F

## 2011-09-10 DIAGNOSIS — D509 Iron deficiency anemia, unspecified: Secondary | ICD-10-CM

## 2011-09-10 MED ORDER — SODIUM CHLORIDE 0.9 % IV SOLN
1020.0000 mg | Freq: Once | INTRAVENOUS | Status: AC
  Administered 2011-09-10: 1020 mg via INTRAVENOUS
  Filled 2011-09-10: qty 34

## 2011-09-10 NOTE — Patient Instructions (Signed)
Ferumoxytol injection What is this medicine? FERUMOXYTOL is an iron complex. Iron is used to make healthy red blood cells, which carry oxygen and nutrients throughout the body. This medicine is used to treat iron deficiency anemia in people with chronic kidney disease. This medicine may be used for other purposes; ask your health care provider or pharmacist if you have questions. What should I tell my health care provider before I take this medicine? They need to know if you have any of these conditions: -anemia not caused by low iron levels -high levels of iron in the blood -magnetic resonance imaging (MRI) test scheduled -an unusual or allergic reaction to iron, other medicines, foods, dyes, or preservatives -pregnant or trying to get pregnant -breast-feeding How should I use this medicine? This medicine is for infusion into a vein. It is given by a health care professional in a hospital or clinic setting. Talk to your pediatrician regarding the use of this medicine in children. Special care may be needed. Overdosage: If you think you've taken too much of this medicine contact a poison control center or emergency room at once. Overdosage: If you think you have taken too much of this medicine contact a poison control center or emergency room at once. NOTE: This medicine is only for you. Do not share this medicine with others. What if I miss a dose? It is important not to miss your dose. Call your doctor or health care professional if you are unable to keep an appointment. What may interact with this medicine? This medicine may interact with the following medications: -other iron products This list may not describe all possible interactions. Give your health care provider a list of all the medicines, herbs, non-prescription drugs, or dietary supplements you use. Also tell them if you smoke, drink alcohol, or use illegal drugs. Some items may interact with your medicine. What should I watch  for while using this medicine? Visit your doctor or healthcare professional regularly. Tell your doctor or healthcare professional if your symptoms do not start to get better or if they get worse. You may need blood work done while you are taking this medicine. You may need to follow a special diet. Talk to your doctor. Foods that contain iron include: whole grains/cereals, dried fruits, beans, or peas, leafy green vegetables, and organ meats (liver, kidney). What side effects may I notice from receiving this medicine? Side effects that you should report to your doctor or health care professional as soon as possible: -allergic reactions like skin rash, itching or hives, swelling of the face, lips, or tongue -breathing problems -changes in blood pressure -feeling faint or lightheaded, falls -fever or chills -flushing, sweating, or hot feelings -swelling of the ankles or feet Side effects that usually do not require medical attention (Report these to your doctor or health care professional if they continue or are bothersome.): -diarrhea -headache -nausea, vomiting -stomach pain This list may not describe all possible side effects. Call your doctor for medical advice about side effects. You may report side effects to FDA at 1-800-FDA-1088. Where should I keep my medicine? This drug is given in a hospital or clinic and will not be stored at home. NOTE: This sheet is a summary. It may not cover all possible information. If you have questions about this medicine, talk to your doctor, pharmacist, or health care provider.  2012, [employer]. (10/28/2007 9:48:25 PM) 

## 2011-09-25 ENCOUNTER — Other Ambulatory Visit: Payer: Medicare Other | Admitting: Lab

## 2011-09-25 ENCOUNTER — Ambulatory Visit: Payer: Medicare Other | Admitting: Hematology & Oncology

## 2011-09-25 ENCOUNTER — Ambulatory Visit: Payer: Medicare Other

## 2011-10-03 ENCOUNTER — Ambulatory Visit (HOSPITAL_BASED_OUTPATIENT_CLINIC_OR_DEPARTMENT_OTHER): Payer: Medicare Other | Admitting: Medical

## 2011-10-03 ENCOUNTER — Other Ambulatory Visit (HOSPITAL_BASED_OUTPATIENT_CLINIC_OR_DEPARTMENT_OTHER): Payer: Medicare Other | Admitting: Lab

## 2011-10-03 ENCOUNTER — Ambulatory Visit: Payer: Medicare Other

## 2011-10-03 VITALS — BP 137/57 | HR 56 | Temp 97.9°F | Resp 20 | Ht 61.0 in | Wt 180.0 lb

## 2011-10-03 DIAGNOSIS — I509 Heart failure, unspecified: Secondary | ICD-10-CM

## 2011-10-03 DIAGNOSIS — N289 Disorder of kidney and ureter, unspecified: Secondary | ICD-10-CM

## 2011-10-03 DIAGNOSIS — D509 Iron deficiency anemia, unspecified: Secondary | ICD-10-CM

## 2011-10-03 DIAGNOSIS — D649 Anemia, unspecified: Secondary | ICD-10-CM

## 2011-10-03 LAB — CBC WITH DIFFERENTIAL (CANCER CENTER ONLY)
BASO#: 0 10*3/uL (ref 0.0–0.2)
Eosinophils Absolute: 0.3 10*3/uL (ref 0.0–0.5)
HCT: 35.5 % (ref 34.8–46.6)
HGB: 11.7 g/dL (ref 11.6–15.9)
LYMPH%: 26.8 % (ref 14.0–48.0)
MCH: 29.9 pg (ref 26.0–34.0)
MCV: 91 fL (ref 81–101)
MONO#: 0.7 10*3/uL (ref 0.1–0.9)
MONO%: 10.2 % (ref 0.0–13.0)
NEUT%: 57.4 % (ref 39.6–80.0)
RBC: 3.91 10*6/uL (ref 3.70–5.32)
WBC: 6.6 10*3/uL (ref 3.9–10.0)

## 2011-10-03 LAB — FERRITIN: Ferritin: 918 ng/mL — ABNORMAL HIGH (ref 10–291)

## 2011-10-03 LAB — IRON AND TIBC
%SAT: 30 % (ref 20–55)
TIBC: 275 ug/dL (ref 250–470)

## 2011-10-03 NOTE — Progress Notes (Signed)
Diagnoses: #1 anemia of renal insufficiency. #2 intermittent iron deficiency anemia.  Current therapy: #1 Aranesp 300 mcg subcutaneous as needed.  For hemoglobin less than 11. #2 IV iron as indicated. Patient las received IV Feraheme 1,020 mg on 09/10/11  Interim history: Ellen [name] presents today for an office followup visit.  Her daughter accompanies her.  Overall, Ellen [name] reports, that she's been doing relatively well.  Back on July 23 she received a dose of IV iron as her iron level was 45, with 16% saturation.  She still continues to report some fatigue, as well as dyspnea on exertion.  However, I, think that's due to her other cardiac, and pulmonary issues.  She recently saw her pulmonologist, who has had her increase her oxygen from 2 L up to 3 or 4 L as needed. Her blood work looks good today.  Her hemoglobin is 11.7 and MCV is 91, Her last ferritin back in July was 308. Since her hemoglobin is above 11 she will not need an Aranesp injection today. She denies any type of nausea, vomiting, diarrhea, constipation, chest pain.  She does have some dyspnea on exertion.  She denies any obvious bleeding.  She does have some lower extremity edema, but utilizes Lasix as needed.  She denies any blurry vision, or headaches.  Review of Systems: Significant for fatigue and DOE, otherwise,  Pt. Denies any changes in their vision, hearing, adenopathy, fevers, chills, nausea, vomiting, diarrhea, constipation, chest pain, shortness of breath, passing blood, passing out, blacking out,  any changes in skin, joints, neurologic or psychiatric except as noted.  Physical Exam:  This is a pleasant, 78 year old, female, in no obvious distress Vitals: Temperature 97.9 degrees, pulse 56, respirations 20, blood pressure 137/57, weight 180 pounds HEENT reveals a normocephalic, atraumatic skull, no scleral icterus, no oral lesions  Neck is supple without any cervical or supraclavicular adenopathy.  Lungs are clear to  auscultation bilaterally.  Cardiac is regular rate and rhythm with a normal S1 and S2. There are no murmurs, rubs, or bruits.  Abdomen is soft with good bowel sounds there's a palpable mass. There is no palpable hepatosplenomegaly. There is no palpable fluid wave.  Musculoskeletal no tenderness of the spine, ribs, or hips.  Extremities there are no clubbing, cyanosis,+ trace b/l lower extremity edema Skin no petechia, purpura or ecchymosis Neurologic is nonfocal.  Laboratory Data: White count 6.6, hemoglobin 11.7, hematocrit 35.5, MCV 91, platelets 260,000 Iron panel pending from today  Current Outpatient Prescriptions on File Prior to Visit  Medication Sig Dispense Refill  . ALPRAZolam (XANAX) 0.25 MG tablet Take 0.25 mg by mouth at bedtime as needed.        [name] aspirin 81 MG tablet Take 81 mg by mouth daily.        . calcium gluconate 500 MG tablet Take 500 mg by mouth 2 (two) times daily.       . cholecalciferol (VITAMIN D) 1000 UNITS tablet Take 2,000 Units by mouth 2 (two) times daily.       [name] esomeprazole (NEXIUM) 40 MG capsule Take 40 mg by mouth daily before breakfast.        . fexofenadine (ALLEGRA) 60 MG tablet Take 60 mg by mouth daily.        . fish oil-omega-3 fatty acids 1000 MG capsule Take 1 g by mouth daily.        . furosemide (LASIX) 80 MG tablet Take 1 tablet by mouth Once daily as needed.      [name]  gentamicin (GARAMYCIN) 0.3 % ophthalmic solution every morning.      [name] glucosamine-chondroitin 500-400 MG tablet Take 1 tablet by mouth 2 (two) times daily.        . Grape Seed 50 MG TABS Take by mouth 2 (two) times daily.      [name] losartan (COZAAR) 50 MG tablet Take 50 mg by mouth daily.        . Multiple Vitamin (MULTIVITAMIN) capsule Take 1 capsule by mouth daily.        . nadolol (CORGARD) 80 MG tablet Take 80 mg by mouth daily.        . nitroGLYCERIN (NITROSTAT) 0.4 MG SL tablet Place 0.4 mg under the tongue every 5 (five) minutes as needed.        . pravastatin (PRAVACHOL)  80 MG tablet Take 1 tablet by mouth Daily.      . sodium chloride (OCEAN) 0.65 % nasal spray 1 spray by Nasal route as needed.        . traMADol (ULTRAM) 50 MG tablet Take 50 mg by mouth daily.         Impression/Plan: This is a pleasant, 78 year old female with the following issues. #1 anemia of renal insufficiency-overall, her hemoglobin is remaining stable.  She does not need an Aranesp injection today.  We will continue to monitor her blood work. #2 iron deficiency anemia-she last received IV Feraheme 1,020 mg on 09/10/11.  Her MCV looks good.  We will cont to monitor her iron levels.  #3 congestive heart failure-she will continue to followup with her dedicated physicians #4.  Followup-Ellen [name] will follow back up with [place] in two months, but before then should there be questions or concerns

## 2011-11-14 ENCOUNTER — Ambulatory Visit (HOSPITAL_BASED_OUTPATIENT_CLINIC_OR_DEPARTMENT_OTHER): Payer: Medicare Other | Admitting: Hematology & Oncology

## 2011-11-14 ENCOUNTER — Ambulatory Visit: Payer: Medicare Other

## 2011-11-14 ENCOUNTER — Other Ambulatory Visit (HOSPITAL_BASED_OUTPATIENT_CLINIC_OR_DEPARTMENT_OTHER): Payer: Medicare Other | Admitting: Lab

## 2011-11-14 DIAGNOSIS — D509 Iron deficiency anemia, unspecified: Secondary | ICD-10-CM

## 2011-11-14 DIAGNOSIS — I509 Heart failure, unspecified: Secondary | ICD-10-CM

## 2011-11-14 DIAGNOSIS — D649 Anemia, unspecified: Secondary | ICD-10-CM

## 2011-11-14 DIAGNOSIS — D631 Anemia in chronic kidney disease: Secondary | ICD-10-CM

## 2011-11-14 DIAGNOSIS — N289 Disorder of kidney and ureter, unspecified: Secondary | ICD-10-CM

## 2011-11-14 LAB — CBC WITH DIFFERENTIAL (CANCER CENTER ONLY)
BASO#: 0.1 10*3/uL (ref 0.0–0.2)
BASO%: 0.7 % (ref 0.0–2.0)
EOS%: 6.5 % (ref 0.0–7.0)
HGB: 11.9 g/dL (ref 11.6–15.9)
LYMPH#: 2.1 10*3/uL (ref 0.9–3.3)
MCHC: 33.1 g/dL (ref 32.0–36.0)
MONO#: 0.7 10*3/uL (ref 0.1–0.9)
NEUT#: 3.6 10*3/uL (ref 1.5–6.5)
WBC: 7 10*3/uL (ref 3.9–10.0)

## 2011-11-14 LAB — IRON AND TIBC
Iron: 68 ug/dL (ref 42–145)
TIBC: 277 ug/dL (ref 250–470)
UIBC: 209 ug/dL (ref 125–400)

## 2011-11-14 NOTE — Progress Notes (Signed)
This office note has been dictated.

## 2011-11-14 NOTE — Progress Notes (Signed)
Patient presents to have labs drawn, Hgb 11.9.  Aranesp held d/t parameters. Teola Bradley, Tylan Briguglio [employer]

## 2011-11-15 NOTE — Progress Notes (Signed)
CC:   Clinton D. Maple Hudson, MD, FCCP, FACP Lyn Records, M.D.  DIAGNOSES: 1. Anemia of renal insufficiency. 2. Intermittent iron-deficiency anemia.  CURRENT THERAPY: 1. Aranesp 300 mcg subcu as needed for hemoglobin less than 11. 2. IV iron as indicated.  INTERIM HISTORY:  [name] comes in for followup.  She is doing all right.  She has congestive heart failure.  She wears oxygen all the time now.  She still has the boot on her left foot.  Hopefully, this will be taken off soon.  She says that the orthopedic doctor saw her recently and that there was still not full healing.  Of note, she last got Feraheme back in July.  She has not had any Aranesp probably for a good 8 months or so.  Her last iron studies done back in August show a ferritin of 918 with an iron saturation of 30%.  PHYSICAL EXAMINATION:  General:  This is a elderly white female in no obvious distress.  Vital Signs:  98.2, pulse 52, respiratory rate 20, blood pressure 148/42.  Weight is 183.  Head and neck:  Normocephalic, atraumatic skull.  There are no ocular or oral lesions.  There are no palpable cervical or supraclavicular lymph nodes.  Lungs:  Some slight decrease at the bases.  She has some slight crackles at the bases bilaterally.  No wheezes are noted.  Cardiac:  Regular rate and rhythm with a normal S1 and S2.  She has a 1/6 systolic ejection murmur. Abdomen:  Soft with good bowel sounds.  There is no palpable abdominal mass.  There may be a slightly enlarged liver with the liver edge at the right costal margin.  I cannot feel a spleen tip.  Extremities:  Boot on her left foot.  There is some trace edema in the left lower leg.  Right lower leg shows minimal edema.  LABORATORY STUDIES:  White cell count is 7, hemoglobin 11.9, hematocrit 35.9, platelet count 234.  MCV is 91.  IMPRESSION:  [name] is an 78 year old white female with congestive heart failure.  This is going to be a life-limiting  condition.  She does not need any iron or Aranesp from my point of view today.  I think we can probably get her back in 2 months' time now.  I want to get her back before the holidays so we can make sure that her blood is maximized over the holidays so she will not have to come back to see [place].    ______________________________ Josph Macho, M.D. PRE/MEDQ  D:  11/14/2011  T:  11/15/2011  Job:  [id number]

## 2011-11-25 ENCOUNTER — Ambulatory Visit: Payer: Medicare Other | Admitting: Internal Medicine

## 2011-12-23 ENCOUNTER — Encounter: Payer: Self-pay | Admitting: Internal Medicine

## 2011-12-23 ENCOUNTER — Ambulatory Visit (INDEPENDENT_AMBULATORY_CARE_PROVIDER_SITE_OTHER): Payer: Medicare Other | Admitting: Internal Medicine

## 2011-12-23 VITALS — BP 120/76 | HR 74 | Ht 61.0 in | Wt 179.8 lb

## 2011-12-23 DIAGNOSIS — J4489 Other specified chronic obstructive pulmonary disease: Secondary | ICD-10-CM

## 2011-12-23 DIAGNOSIS — J449 Chronic obstructive pulmonary disease, unspecified: Secondary | ICD-10-CM

## 2011-12-23 NOTE — Patient Instructions (Addendum)
We can continue O2 at 2 L  Please call as needed

## 2011-12-23 NOTE — Progress Notes (Signed)
Patient ID: Ellen [name], female    DOB: 06-27-1926, 78 y.o.   MRN: [id number]  HPI 07/26/10- 50 yo never smoker, followed for COPD, allergic rhinitis, complicated by CAD, renal insufficiency, anemia  and GERD Last here February 26, 2010- did have a bronchitis and an episode of gastroenteritis with dehydration, but those have resolved. Felt that pollen bothered her this Spring. Yesterday was more short of breath. Had gone to see Dr Smith/cardiology- she hurried to get there and lost her breath for awhile. He told her she was retaining some fluid. Hgb recently 10- given injection by Dr Myna Hidalgo. Wears oxygen for sleep at 2 L/M. We reviewed her done last year. She had portable O2 in the past but couldn't see well enough for it to be worthwhile and turned it in.  PFT 03/05/10- moderate obstruction FEV1/ FVC 0.65, insignif resp to BD. Unable to perform LV or DLCO.  04/23/11- 33 yo never smoker, followed for COPD, allergic rhinitis, complicated by CAD, renal insufficiency, anemia  and GERD Daughter is here. Not breathing well. [facility] provided light portable pulse oxygen. Some mild cough with occasional scant, thick, white sputum. Had chest pain last week and evaluated by Dr. Smith/cardiology and she understands he felt she was okay. Noticing some confusion, loss of memory. Her primary physician asked her to use oxygen more consistently. Her hematologist is watching chronic anemia. Can't afford Spiriva.  08/23/11- 61 yo never smoker, followed for COPD, allergic rhinitis, complicated by CAD, renal insufficiency, anemia  and GERD Not breathing well today-been more active; having increased SOB Increased shortness of breath for several weeks. No distinct infection but she can't rule out a mild cold at the beginning. Loose cough and hacking. She took some Lasix recently put her feet started swelling.  12/23/11-84 yo never smoker, followed for COPD, allergic rhinitis, complicated by CAD, renal  insufficiency, anemia  and GERD FOLLOWS FOR:SOB with rushed activity or increased anxiety/stress     friend here COPD assessment test (CAT) score 34/40 Cough is productive of clear thick sputum. She continues oxygen at 2 L/advanced. Sleeps and arrived from chair but planning to get a hospital bed CXR 08/30/11-reviewed IMPRESSION:  Stable exam. Chronic elevation of the left hemidiaphragm with left  basilar scarring and / or atelectasis. Chronic bronchitic changes.  Original Report Authenticated By: Andreas Newport, M.D.   ROS-see HPI Constitutional:   No-   weight loss, night sweats, fevers, chills, fatigue, lassitude. HEENT:   No-  headaches, difficulty swallowing, tooth/dental problems, sore throat,       No-  sneezing, itching, ear ache, nasal congestion, post nasal drip,  CV:  No-   chest pain, orthopnea, PND, swelling in lower extremities, dizziness, palpitations Resp: +  shortness of breath with exertion or at rest.              +   productive cough,  + non-productive cough,  No- coughing up of blood.              No-   change in color of mucus.  No- wheezing.   Skin: No-   rash or lesions. GI:  No-   heartburn, indigestion, abdominal pain, nausea, vomiting GU:  MS:  No-   joint pain or swelling.   Neuro-     nothing unusual Psych:  No- change in mood or affect. No depression or anxiety.  No memory loss.  OBJ- Physical Exam BP 120/76  Pulse 74  Ht 5\' 1"  (  1.549 m)  Wt 179 lb 12.8 oz (81.557 kg)  BMI 33.97 kg/m2  SpO2 94% General- Alert, Oriented, Affect-appropriate, Distress- none acute, O2 sat 94% on 2 L pulsed. Skin- rash-none, lesions- none, excoriation- none Lymphadenopathy- none Head- atraumatic            Eyes- Gross vision intact, PERRLA, conjunctivae and secretions clear            Ears- Hearing, canals-normal            Nose- Clear, no-Septal dev, mucus, polyps, erosion, perforation             Throat- Mallampati II , mucosa clear , drainage- none, tonsils-  atrophic Neck- flexible , trachea midline, no stridor , thyroid nl, carotid no bruit Chest - symmetrical excursion , unlabored           Heart/CV- RRR , no murmur , no gallop  , no rub, nl s1 s2                           - JVD+ 1 cm , edema- none, stasis changes- none, varices- none           Lung- soft crackles L>R, wheeze- none, cough- none , dullness-none, rub- none           Chest wall-  Abd-  Br/ Gen/ Rectal- Not done, not indicated Extrem- cyanosis- none, clubbing, none, atrophy- none, strength- nl.+  Neuro- grossly intact to observation, calm

## 2012-01-03 NOTE — Assessment & Plan Note (Addendum)
Acute exacerbation with increased productive cough in the last few weeks. Not a bacterial infection at this point. We considered options and are going to try to wait it out.

## 2012-01-08 ENCOUNTER — Ambulatory Visit (HOSPITAL_BASED_OUTPATIENT_CLINIC_OR_DEPARTMENT_OTHER): Payer: Medicare Other | Admitting: Hematology & Oncology

## 2012-01-08 ENCOUNTER — Other Ambulatory Visit (HOSPITAL_BASED_OUTPATIENT_CLINIC_OR_DEPARTMENT_OTHER): Payer: Medicare Other | Admitting: Lab

## 2012-01-08 ENCOUNTER — Ambulatory Visit: Payer: Medicare Other

## 2012-01-08 VITALS — BP 132/57 | HR 56 | Temp 97.9°F | Resp 16 | Ht 61.0 in | Wt 179.0 lb

## 2012-01-08 DIAGNOSIS — D631 Anemia in chronic kidney disease: Secondary | ICD-10-CM

## 2012-01-08 DIAGNOSIS — D509 Iron deficiency anemia, unspecified: Secondary | ICD-10-CM

## 2012-01-08 DIAGNOSIS — D649 Anemia, unspecified: Secondary | ICD-10-CM

## 2012-01-08 DIAGNOSIS — N189 Chronic kidney disease, unspecified: Secondary | ICD-10-CM

## 2012-01-08 LAB — CBC WITH DIFFERENTIAL (CANCER CENTER ONLY)
BASO#: 0 10*3/uL (ref 0.0–0.2)
Eosinophils Absolute: 0.3 10*3/uL (ref 0.0–0.5)
HCT: 37.5 % (ref 34.8–46.6)
HGB: 12.4 g/dL (ref 11.6–15.9)
LYMPH#: 1.4 10*3/uL (ref 0.9–3.3)
MCH: 29.9 pg (ref 26.0–34.0)
NEUT#: 4.2 10*3/uL (ref 1.5–6.5)
NEUT%: 64.7 % (ref 39.6–80.0)
RBC: 4.15 10*6/uL (ref 3.70–5.32)

## 2012-01-08 LAB — FERRITIN: Ferritin: 659 ng/mL — ABNORMAL HIGH (ref 10–291)

## 2012-01-08 LAB — IRON AND TIBC
TIBC: 300 ug/dL (ref 250–470)
UIBC: 248 ug/dL (ref 125–400)

## 2012-01-08 NOTE — Progress Notes (Signed)
This office note has been dictated.

## 2012-01-10 NOTE — Progress Notes (Signed)
CC:   Ellen Showers, MD Lyn Records, M.D. Clinton D. Young, MD, FCCP, FACP  DISCHARGE DIAGNOSES: 1. Anemia of renal insufficiency. 2. Intermittent iron-deficiency anemia.  CURRENT THERAPY: 1. Aranesp 300 mcg subcu as needed for hemoglobin less than 11. 2. IV iron as indicated.  INTERIM HISTORY:  [name] comes in for followup.  She is doing quite well.  She still wears oxygen.  She has congestive heart failure.  Her memory seems to be a little bit better from my point of view.  She is no wearing a boot on her left foot anymore.  This is encouraging.  She has had no bleeding.  There has been no problem with bowels or bladder.  She has had no fever.  There has been no leg swelling.  On her last visit here, her ferritin was 705 with iron saturation of 25%.  PHYSICAL EXAMINATION:  General:  This is an elderly white female in no obvious distress.  Vital signs:  97.9, pulse 56, respiratory rate 16, blood pressure 132/57.  Weight is 179.  Head and neck:  Normocephalic, atraumatic skull.  There are no ocular or oral lesions.  There are no palpable cervical or supraclavicular lymph nodes.  Lungs:  Clear bilaterally.  Cardiac:  Regular rate and rhythm with a normal S1 and S2. There are no murmurs, rubs, or bruits.  Abdomen:  Soft with good bowel sounds.  There is no palpable abdominal mass.  There is no palpable hepatosplenomegaly.  Back:  No tenderness over the spine, ribs, or hips. Extremities:  No clubbing, cyanosis or edema.  She has decent range of motion of her joints.  LABORATORY STUDIES:  White cell count 6.5, hemoglobin 12.4, hematocrit 37.5, platelet count 277.  IMPRESSION:  [name] is an 78 year old white female with multifactorial anemia.  She is doing incredibly well right now from an anemia point of view.  I really think that we can get her back now after the new year.  She has been maintaining her hemoglobin fairly well over the past couple  of months.    ______________________________ Ellen [name], M.D. PRE/MEDQ  D:  01/08/2012  T:  01/09/2012  Job:  [id number]

## 2012-01-13 ENCOUNTER — Telehealth: Payer: Self-pay | Admitting: Hematology & Oncology

## 2012-01-13 ENCOUNTER — Other Ambulatory Visit: Payer: Self-pay | Admitting: *Deleted

## 2012-01-13 ENCOUNTER — Telehealth: Payer: Self-pay | Admitting: *Deleted

## 2012-01-13 DIAGNOSIS — D509 Iron deficiency anemia, unspecified: Secondary | ICD-10-CM

## 2012-01-13 NOTE — Telephone Encounter (Addendum)
Message copied by Wynonia Hazard on Mon Jan 13, 2012 11:43 AM ------      Message from: Arlan Organ R      Created: Mon Jan 13, 2012  7:38 AM       Call her daughter and tell her that the iron levels are going down again. She needs one dose ofFerahme at 510 mg. Please set this up in 1-2 weeks. Thanks. Pete  01/13/12 - Spoke to pt's dgtr regarding results. She will speak to her mother about receiving the Feraheme and call back to schedule. Will send instructions to scheduling for when she calls.

## 2012-01-13 NOTE — Telephone Encounter (Signed)
Pt made 01-22-12 iron infusion appointment

## 2012-01-13 NOTE — Progress Notes (Signed)
Pt to receive FeraHeme 510mg  IV x 1 in 1 - 2 week per Dr Myna Hidalgo.

## 2012-01-22 ENCOUNTER — Ambulatory Visit (HOSPITAL_BASED_OUTPATIENT_CLINIC_OR_DEPARTMENT_OTHER): Payer: Medicare Other

## 2012-01-22 VITALS — BP 134/64 | HR 52 | Temp 98.2°F | Resp 18

## 2012-01-22 DIAGNOSIS — D509 Iron deficiency anemia, unspecified: Secondary | ICD-10-CM

## 2012-01-22 MED ORDER — FERUMOXYTOL INJECTION 510 MG/17 ML
510.0000 mg | Freq: Once | INTRAVENOUS | Status: AC
  Administered 2012-01-22: 510 mg via INTRAVENOUS
  Filled 2012-01-22: qty 17

## 2012-01-22 MED ORDER — SODIUM CHLORIDE 0.9 % IV SOLN
Freq: Once | INTRAVENOUS | Status: AC
  Administered 2012-01-22: 16:00:00 via INTRAVENOUS

## 2012-01-22 NOTE — Patient Instructions (Signed)
Ferumoxytol injection What is this medicine? FERUMOXYTOL is an iron complex. Iron is used to make healthy red blood cells, which carry oxygen and nutrients throughout the body. This medicine is used to treat iron deficiency anemia in people with chronic kidney disease. This medicine may be used for other purposes; ask your health care provider or pharmacist if you have questions. What should I tell my health care provider before I take this medicine? They need to know if you have any of these conditions: -anemia not caused by low iron levels -high levels of iron in the blood -magnetic resonance imaging (MRI) test scheduled -an unusual or allergic reaction to iron, other medicines, foods, dyes, or preservatives -pregnant or trying to get pregnant -breast-feeding How should I use this medicine? This medicine is for infusion into a vein. It is given by a health care professional in a hospital or clinic setting. Talk to your pediatrician regarding the use of this medicine in children. Special care may be needed. Overdosage: If you think you've taken too much of this medicine contact a poison control center or emergency room at once. Overdosage: If you think you have taken too much of this medicine contact a poison control center or emergency room at once. NOTE: This medicine is only for you. Do not share this medicine with others. What if I miss a dose? It is important not to miss your dose. Call your doctor or health care professional if you are unable to keep an appointment. What may interact with this medicine? This medicine may interact with the following medications: -other iron products This list may not describe all possible interactions. Give your health care provider a list of all the medicines, herbs, non-prescription drugs, or dietary supplements you use. Also tell them if you smoke, drink alcohol, or use illegal drugs. Some items may interact with your medicine. What should I watch  for while using this medicine? Visit your doctor or healthcare professional regularly. Tell your doctor or healthcare professional if your symptoms do not start to get better or if they get worse. You may need blood work done while you are taking this medicine. You may need to follow a special diet. Talk to your doctor. Foods that contain iron include: whole grains/cereals, dried fruits, beans, or peas, leafy green vegetables, and organ meats (liver, kidney). What side effects may I notice from receiving this medicine? Side effects that you should report to your doctor or health care professional as soon as possible: -allergic reactions like skin rash, itching or hives, swelling of the face, lips, or tongue -breathing problems -changes in blood pressure -feeling faint or lightheaded, falls -fever or chills -flushing, sweating, or hot feelings -swelling of the ankles or feet Side effects that usually do not require medical attention (Report these to your doctor or health care professional if they continue or are bothersome.): -diarrhea -headache -nausea, vomiting -stomach pain This list may not describe all possible side effects. Call your doctor for medical advice about side effects. You may report side effects to FDA at 1-800-FDA-1088. Where should I keep my medicine? This drug is given in a hospital or clinic and will not be stored at home. NOTE: This sheet is a summary. It may not cover all possible information. If you have questions about this medicine, talk to your doctor, pharmacist, or health care provider.  2012, [employer]. (10/28/2007 9:48:25 PM) 

## 2012-03-12 ENCOUNTER — Ambulatory Visit: Payer: Medicare Other

## 2012-03-12 ENCOUNTER — Other Ambulatory Visit (HOSPITAL_BASED_OUTPATIENT_CLINIC_OR_DEPARTMENT_OTHER): Payer: Medicare Other | Admitting: Lab

## 2012-03-12 ENCOUNTER — Ambulatory Visit (HOSPITAL_BASED_OUTPATIENT_CLINIC_OR_DEPARTMENT_OTHER): Payer: Medicare Other | Admitting: Medical

## 2012-03-12 VITALS — BP 131/45 | HR 55 | Temp 97.9°F | Resp 18 | Ht 61.0 in | Wt 180.0 lb

## 2012-03-12 DIAGNOSIS — D509 Iron deficiency anemia, unspecified: Secondary | ICD-10-CM

## 2012-03-12 DIAGNOSIS — D631 Anemia in chronic kidney disease: Secondary | ICD-10-CM

## 2012-03-12 DIAGNOSIS — N189 Chronic kidney disease, unspecified: Secondary | ICD-10-CM

## 2012-03-12 LAB — CBC WITH DIFFERENTIAL (CANCER CENTER ONLY)
BASO#: 0 10*3/uL (ref 0.0–0.2)
BASO%: 0.4 % (ref 0.0–2.0)
EOS%: 4.5 % (ref 0.0–7.0)
Eosinophils Absolute: 0.4 10*3/uL (ref 0.0–0.5)
HCT: 34.6 % — ABNORMAL LOW (ref 34.8–46.6)
HGB: 11.2 g/dL — ABNORMAL LOW (ref 11.6–15.9)
LYMPH#: 1.7 10*3/uL (ref 0.9–3.3)
LYMPH%: 21.4 % (ref 14.0–48.0)
MCH: 29.9 pg (ref 26.0–34.0)
MCHC: 32.4 g/dL (ref 32.0–36.0)
MCV: 93 fL (ref 81–101)
MONO#: 0.6 10*3/uL (ref 0.1–0.9)
MONO%: 7.3 % (ref 0.0–13.0)
NEUT#: 5.2 10*3/uL (ref 1.5–6.5)
NEUT%: 66.4 % (ref 39.6–80.0)
Platelets: 248 10*3/uL (ref 145–400)
RBC: 3.74 10*6/uL (ref 3.70–5.32)
RDW: 12.4 % (ref 11.1–15.7)
WBC: 7.8 10*3/uL (ref 3.9–10.0)

## 2012-03-12 LAB — IRON AND TIBC
Iron: 63 ug/dL (ref 42–145)
TIBC: 244 ug/dL — ABNORMAL LOW (ref 250–470)
UIBC: 181 ug/dL (ref 125–400)

## 2012-03-12 LAB — FERRITIN: Ferritin: 1088 ng/mL — ABNORMAL HIGH (ref 10–291)

## 2012-03-12 NOTE — Progress Notes (Signed)
Diagnoses: #1.  Anemia of renal insufficiency. #2.  Intermittent iron deficiency anemia.  Current therapy: #1.  Aranesp 300 mcg subcutaneous as needed.  For hemoglobin less than 11. #2.  IV iron as indicated.  Last dose of IV iron was back on 01/22/2012  Interim history: Ellen [name] presents today for an office followup visit.  Her daughter accompanies her.  Overall, she is doing relatively well.  She does report, that she recently took a fall and bruised her left knee.  She still continues to wear her oxygen.  She does have congestive heart failure and COPD.[name]  She continues to followup with Dr. Maple Hudson.  The last, time, she received IV iron was back on 01/22/2012.  Her ferritin level was 659, iron of 52, with 17% saturation.  Her elevated ferritin was secondary to acute phase reactant.  Surprisingly, her hemoglobin is above 11.  today, as, such she will not require an Aranesp injection.  She, reports, that she has a good appetite.  She denies any nausea, vomiting, diarrhea, constipation, any cough, chest pain.  She does have some chronic shortness of breath.  She denies any fevers, chills, or night sweats.  She denies any obvious rib, normal, bleeding.  She denies any lower leg swelling.  She denies any headaches, visual changes, or rashes.  Review of Systems: Constitutional:Negative for malaise/fatigue, fever, chills, weight loss, diaphoresis, activity change, appetite change, and unexpected weight change.  HEENT: Negative for double vision, blurred vision, visual loss, ear pain, tinnitus, congestion, rhinorrhea, epistaxis sore throat or sinus disease, oral pain/lesion, tongue soreness Respiratory: Negative for cough, chest tightness, shortness of breath, wheezing and stridor.  Cardiovascular: Negative for chest pain, palpitations, leg swelling, orthopnea, PND, DOE or claudication Gastrointestinal: Negative for nausea, vomiting, abdominal pain, diarrhea, constipation, blood in stool, melena,  hematochezia, abdominal distention, anal bleeding, rectal pain, anorexia and hematemesis.  Genitourinary: Negative for dysuria, frequency, hematuria,  Musculoskeletal: Negative for myalgias, back pain, joint swelling, arthralgias and gait problem.  Skin: Negative for rash, color change, pallor and wound.  Neurological:. Negative for dizziness/light-headedness, tremors, seizures, syncope, facial asymmetry, speech difficulty, weakness, numbness, headaches and paresthesias.  Hematological: Negative for adenopathy. Does not bruise/bleed easily.  Psychiatric/Behavioral:  Negative for depression, no loss of interest in normal activity or change in sleep pattern.   Physical Exam: This is a pleasant, 85, elderly, white female, in no obvious distress Vitals: Temperature 97.9 degrees, pulse 55, respirations 18, blood pressure 131/45.  Weight 180 pounds HEENT reveals a normocephalic, atraumatic skull, no scleral icterus, no oral lesions  Neck is supple without any cervical or supraclavicular adenopathy.  Lungs are clear to auscultation bilaterally. There are no wheezes, rales or rhonci Cardiac is regular rate and rhythm with a normal S1 and S2. There are no murmurs, rubs, or bruits.  Abdomen is soft with good bowel sounds, there is no palpable mass. There is no palpable hepatosplenomegaly. There is no palpable fluid wave.  Musculoskeletal no tenderness of the spine, ribs, or hips.  Extremities there are no clubbing, cyanosis, or edema.  Skin no petechia, purpura or ecchymosis Neurologic is nonfocal.  Laboratory Data:  White count 7.8, hemoglobin 11.2, hematocrit 34.6, platelets 248,000  Current Outpatient Prescriptions on File Prior to Visit  Medication Sig Dispense Refill  . ALPRAZolam (XANAX) 0.25 MG tablet Take 0.25 mg by mouth at bedtime as needed.        [name] aspirin 81 MG tablet Take 81 mg by mouth daily.        [name]  calcium gluconate 500 MG tablet Take 500 mg by mouth daily.       . cholecalciferol  (VITAMIN D) 1000 UNITS tablet Take 2,000 Units by mouth 2 (two) times daily.       [name] esomeprazole (NEXIUM) 40 MG capsule Take 40 mg by mouth daily before breakfast.        . fexofenadine (ALLEGRA) 60 MG tablet Take 60 mg by mouth daily.        . fish oil-omega-3 fatty acids 1000 MG capsule Take 1 g by mouth daily.        . furosemide (LASIX) 80 MG tablet Take 1 tablet by mouth as needed.       [name] gentamicin (GARAMYCIN) 0.3 % ophthalmic solution every morning.      . Grape Seed 50 MG TABS Take by mouth 2 (two) times daily.      [name] ipratropium (ATROVENT) 0.03 % nasal spray Place into the nose as needed.       [name] losartan (COZAAR) 50 MG tablet Take 50 mg by mouth daily.        . Multiple Vitamins-Minerals (EYE VITAMINS) CAPS Take by mouth every morning.      . nadolol (CORGARD) 80 MG tablet Take 80 mg by mouth daily.        . pravastatin (PRAVACHOL) 80 MG tablet Take 1 tablet by mouth Daily.      . traMADol (ULTRAM) 50 MG tablet Take 50 mg by mouth daily.         Assessment/Plan: This is a pleasant, 79 year old, elderly, white female, with the following issues:  #1.  Anemia of renal insufficiency.  Her hemoglobin was above 11 today, as such, she will not need an Aranesp injection.  #2.  Intermittent iron deficiency anemia.  Her last dose of IV iron was back in December.  We are checking an iron panel on her.  #3.  Followup.  [name] will  follow back up with [place] in about 2 months, but before then should there be questions or concerns.

## 2012-03-12 NOTE — Progress Notes (Signed)
Patient comes in today, CBC checked, Aranasp injection not given due to parameters.  Hgb was  11.2 .  Patient seen by [name], PA.

## 2012-03-16 ENCOUNTER — Telehealth: Payer: Self-pay

## 2012-03-16 NOTE — Telephone Encounter (Addendum)
Message copied by Leana Gamer on Mon Mar 16, 2012  4:26 PM ------      Message from: Arlan Organ R      Created: Sun Mar 15, 2012  4:32 PM       Call dgtr - iron is ok!!  Cindee Lame  Patient call left message on voice regarding her iron being ok.

## 2012-05-07 ENCOUNTER — Ambulatory Visit: Payer: Medicare Other

## 2012-05-07 ENCOUNTER — Ambulatory Visit (HOSPITAL_BASED_OUTPATIENT_CLINIC_OR_DEPARTMENT_OTHER): Payer: Medicare Other | Admitting: Medical

## 2012-05-07 ENCOUNTER — Ambulatory Visit: Payer: Medicare Other | Admitting: Hematology & Oncology

## 2012-05-07 ENCOUNTER — Other Ambulatory Visit: Payer: Medicare Other | Admitting: Lab

## 2012-05-07 ENCOUNTER — Other Ambulatory Visit (HOSPITAL_BASED_OUTPATIENT_CLINIC_OR_DEPARTMENT_OTHER): Payer: Medicare Other | Admitting: Lab

## 2012-05-07 VITALS — BP 135/45 | HR 60 | Temp 98.1°F | Resp 18 | Ht 61.0 in | Wt 180.0 lb

## 2012-05-07 DIAGNOSIS — J069 Acute upper respiratory infection, unspecified: Secondary | ICD-10-CM

## 2012-05-07 DIAGNOSIS — D509 Iron deficiency anemia, unspecified: Secondary | ICD-10-CM

## 2012-05-07 DIAGNOSIS — D631 Anemia in chronic kidney disease: Secondary | ICD-10-CM

## 2012-05-07 DIAGNOSIS — N189 Chronic kidney disease, unspecified: Secondary | ICD-10-CM

## 2012-05-07 DIAGNOSIS — D649 Anemia, unspecified: Secondary | ICD-10-CM

## 2012-05-07 DIAGNOSIS — N289 Disorder of kidney and ureter, unspecified: Secondary | ICD-10-CM

## 2012-05-07 LAB — CBC WITH DIFFERENTIAL (CANCER CENTER ONLY)
BASO%: 0.7 % (ref 0.0–2.0)
Eosinophils Absolute: 0.3 10*3/uL (ref 0.0–0.5)
HCT: 35.3 % (ref 34.8–46.6)
LYMPH%: 22.6 % (ref 14.0–48.0)
MCH: 30.3 pg (ref 26.0–34.0)
MCV: 95 fL (ref 81–101)
MONO#: 0.7 10*3/uL (ref 0.1–0.9)
NEUT%: 61.7 % (ref 39.6–80.0)
RDW: 12.6 % (ref 11.1–15.7)
WBC: 6.8 10*3/uL (ref 3.9–10.0)

## 2012-05-07 LAB — IRON AND TIBC
%SAT: 25 % (ref 20–55)
TIBC: 274 ug/dL (ref 250–470)

## 2012-05-07 MED ORDER — CEFDINIR 300 MG PO CAPS: 300.0000 mg | ORAL_CAPSULE | Freq: Two times a day (BID) | ORAL | Status: DC

## 2012-05-07 NOTE — Progress Notes (Signed)
Patient comes in today, CBC checked, Aranesp injection not given due to parameters.  Hgb was 11.2 .  Patient made aware, seen by [name], PA.

## 2012-05-07 NOTE — Progress Notes (Signed)
Diagnoses: #1.  Anemia of renal insufficiency. #2.  Intermittent iron deficiency anemia.  Current therapy: #1.  Aranesp 300 mcg subcutaneous as needed.  For hemoglobin less than 11. #2.  IV iron as indicated.  Last dose of IV iron was back on 01/22/2012  Interim history: Ellen [name] presents today for an office followup visit.  Her daughter accompanies her.  Overall, she is doing relatively well.  She still continues to wear her oxygen.  She does have congestive heart failure and COPD.[name]  She continues to followup with Dr. Maple Hudson.  The last, time, she received IV iron was back on 01/22/2012.  Her last iron panel.  Back in January, revealed an iron of 63, with 26% saturation and a ferritin of 1088.  Surprisingly, her hemoglobin is above 11.  today, as, such she will not require an Aranesp injection.  She, reports, that she has a good appetite.  She denies any nausea, vomiting, diarrhea, constipation, any cough, chest pain.  She does have some chronic shortness of breath.  She denies any fevers, chills, or night sweats.  She denies any obvious rib, normal, bleeding.  She denies any lower leg swelling.  She denies any headaches, visual changes, or rashes.  of note, she was recently seen by her primary care physician for bronchitis/upper tentorium, infection.  She was given a Z-Pak.  She, reports, that she completed the Z-Pak, however, she still is symptomatic.  She still continues to have a productive cough with green to yellowish sputum.  She did have a chest x-ray by her primary care physician, which ruled out pneumonia.  I will go ahead and prescribe Omnicef for her, however, I did tell her that if this does not clear up, she needs to get back in touch with her primary care physician   Review of Systems: Constitutional:Negative for malaise/fatigue, fever, chills, weight loss, diaphoresis, activity change, appetite change, and unexpected weight change.  HEENT: Negative for double vision, blurred vision,  visual loss, ear pain, tinnitus, congestion, rhinorrhea, epistaxis sore throat or sinus disease, oral pain/lesion, tongue soreness Respiratory: Negative for cough, chest tightness, shortness of breath, wheezing and stridor.  Cardiovascular: Negative for chest pain, palpitations, leg swelling, orthopnea, PND, DOE or claudication Gastrointestinal: Negative for nausea, vomiting, abdominal pain, diarrhea, constipation, blood in stool, melena, hematochezia, abdominal distention, anal bleeding, rectal pain, anorexia and hematemesis.  Genitourinary: Negative for dysuria, frequency, hematuria,  Musculoskeletal: Negative for myalgias, back pain, joint swelling, arthralgias and gait problem.  Skin: Negative for rash, color change, pallor and wound.  Neurological:. Negative for dizziness/light-headedness, tremors, seizures, syncope, facial asymmetry, speech difficulty, weakness, numbness, headaches and paresthesias.  Hematological: Negative for adenopathy. Does not bruise/bleed easily.  Psychiatric/Behavioral:  Negative for depression, no loss of interest in normal activity or change in sleep pattern.   Physical Exam: This is a pleasant, 79, elderly, white female, in no obvious distress Vitals: Temperature 90.1 degrees, pulse 60, respirations 18, blood pressure 135/45 HEENT reveals a normocephalic, atraumatic skull, no scleral icterus, no oral lesions  Neck is supple without any cervical or supraclavicular adenopathy.  Lungs are clear to auscultation bilaterally. There are no wheezes, rales or rhonci Cardiac is regular rate and rhythm with a normal S1 and S2. There are no murmurs, rubs, or bruits.  Abdomen is soft with good bowel sounds, there is no palpable mass. There is no palpable hepatosplenomegaly. There is no palpable fluid wave.  Musculoskeletal no tenderness of the spine, ribs, or hips.  Extremities there are no  clubbing, cyanosis, or edema.  Skin no petechia, purpura or ecchymosis Neurologic is  nonfocal.  Laboratory Data: White count 6.8, hemoglobin 11.2, hematocrit 35.3, platelets 239,000  Current Outpatient Prescriptions on File Prior to Visit  Medication Sig Dispense Refill  . ALPRAZolam (XANAX) 0.25 MG tablet Take 0.25 mg by mouth at bedtime as needed.        [name] aspirin 81 MG tablet Take 81 mg by mouth daily.        . calcium gluconate 500 MG tablet Take 500 mg by mouth daily.       . cholecalciferol (VITAMIN D) 1000 UNITS tablet Take 2,000 Units by mouth 2 (two) times daily.       [name] esomeprazole (NEXIUM) 40 MG capsule Take 40 mg by mouth daily before breakfast.        . fexofenadine (ALLEGRA) 60 MG tablet Take 60 mg by mouth daily.        . fish oil-omega-3 fatty acids 1000 MG capsule Take 1 g by mouth daily.        . furosemide (LASIX) 80 MG tablet Take 1 tablet by mouth as needed.       [name] gentamicin (GARAMYCIN) 0.3 % ophthalmic solution every morning.      . Grape Seed 50 MG TABS Take by mouth 2 (two) times daily.      [name] ipratropium (ATROVENT) 0.03 % nasal spray Place into the nose as needed.       [name] losartan (COZAAR) 50 MG tablet Take 50 mg by mouth daily.        . Multiple Vitamins-Minerals (EYE VITAMINS) CAPS Take by mouth every morning.      . nadolol (CORGARD) 80 MG tablet Take 80 mg by mouth daily.        . pravastatin (PRAVACHOL) 80 MG tablet Take 1 tablet by mouth Daily.      . traMADol (ULTRAM) 50 MG tablet Take 50 mg by mouth daily.         Assessment/Plan: This is a pleasant, 79 year old, elderly, white female, with the following issues:  #1.  Anemia of renal insufficiency.  Her hemoglobin was above 11 today, as such, she will not need an Aranesp injection.  #2.  Intermittent iron deficiency anemia.  Her last dose of IV iron was back in December.  We are checking an iron panel on her.  #3.  Upper respiratory infection.  She was given Z-Pak and completed that course.  I'm going to go ahead and prescribe her Omnicef.  If her symptoms persist, I advised her to  contact her primary care physician.    #4.  Followup.  Ellen [name] will  follow back up with [place] in about 2 months, but before then should there be questions or concerns.

## 2012-05-18 ENCOUNTER — Ambulatory Visit
Admission: RE | Admit: 2012-05-18 | Discharge: 2012-05-18 | Disposition: A | Discharge status: Home / Self Care | Payer: Medicare Other | Source: Ambulatory Visit | Attending: Internal Medicine | Admitting: Internal Medicine

## 2012-05-18 ENCOUNTER — Other Ambulatory Visit: Payer: Self-pay | Admitting: Internal Medicine

## 2012-05-18 DIAGNOSIS — R413 Other amnesia: Secondary | ICD-10-CM

## 2012-05-18 DIAGNOSIS — R410 Disorientation, unspecified: Secondary | ICD-10-CM

## 2012-06-23 ENCOUNTER — Ambulatory Visit (INDEPENDENT_AMBULATORY_CARE_PROVIDER_SITE_OTHER): Payer: Medicare Other | Admitting: Internal Medicine

## 2012-06-23 ENCOUNTER — Ambulatory Visit (INDEPENDENT_AMBULATORY_CARE_PROVIDER_SITE_OTHER)
Admission: RE | Admit: 2012-06-23 | Discharge: 2012-06-23 | Disposition: A | Discharge status: Home / Self Care | Payer: Medicare Other | Source: Ambulatory Visit | Attending: Internal Medicine | Admitting: Internal Medicine

## 2012-06-23 ENCOUNTER — Other Ambulatory Visit (INDEPENDENT_AMBULATORY_CARE_PROVIDER_SITE_OTHER): Payer: Medicare Other

## 2012-06-23 ENCOUNTER — Encounter: Payer: Self-pay | Admitting: Internal Medicine

## 2012-06-23 VITALS — BP 158/76 | HR 60 | Ht 60.5 in | Wt 178.6 lb

## 2012-06-23 DIAGNOSIS — R0600 Dyspnea, unspecified: Secondary | ICD-10-CM

## 2012-06-23 DIAGNOSIS — J449 Chronic obstructive pulmonary disease, unspecified: Secondary | ICD-10-CM

## 2012-06-23 DIAGNOSIS — I10 Essential (primary) hypertension: Secondary | ICD-10-CM

## 2012-06-23 DIAGNOSIS — R0609 Other forms of dyspnea: Secondary | ICD-10-CM

## 2012-06-23 DIAGNOSIS — D509 Iron deficiency anemia, unspecified: Secondary | ICD-10-CM

## 2012-06-23 DIAGNOSIS — R0989 Other specified symptoms and signs involving the circulatory and respiratory systems: Secondary | ICD-10-CM

## 2012-06-23 DIAGNOSIS — J439 Emphysema, unspecified: Secondary | ICD-10-CM

## 2012-06-23 DIAGNOSIS — J438 Other emphysema: Secondary | ICD-10-CM

## 2012-06-23 LAB — CBC WITH DIFFERENTIAL/PLATELET
Eosinophils Relative: 2.9 % (ref 0.0–5.0)
HCT: 34 % — ABNORMAL LOW (ref 36.0–46.0)
Hemoglobin: 11.6 g/dL — ABNORMAL LOW (ref 12.0–15.0)
Lymphs Abs: 1.4 10*3/uL (ref 0.7–4.0)
MCV: 88.6 fl (ref 78.0–100.0)
Monocytes Absolute: 0.5 10*3/uL (ref 0.1–1.0)
Monocytes Relative: 6.4 % (ref 3.0–12.0)
Neutro Abs: 6.1 10*3/uL (ref 1.4–7.7)
Platelets: 238 10*3/uL (ref 150.0–400.0)
RDW: 12.5 % (ref 11.5–14.6)
WBC: 8.3 10*3/uL (ref 4.5–10.5)

## 2012-06-23 LAB — BASIC METABOLIC PANEL
BUN: 8 mg/dL (ref 6–23)
Chloride: 97 mEq/L (ref 96–112)
GFR: 92.24 mL/min (ref >60.00)
Glucose, Bld: 116 mg/dL — ABNORMAL HIGH (ref 70–99)
Potassium: 4.1 mEq/L (ref 3.5–5.1)
Sodium: 136 mEq/L (ref 135–145)

## 2012-06-23 MED ORDER — IPRATROPIUM BROMIDE 0.03 % NA SOLN: NASAL | Status: DC

## 2012-06-23 NOTE — Patient Instructions (Addendum)
See how you feel when you get your blood pressure medicines refilled. If you don't feel better, then let your doctor know.  Order- CXR   Dx COPD             Lab  BMET, CBC, BNP    Dx COPD, dyspnea

## 2012-06-23 NOTE — Progress Notes (Signed)
Patient ID: Ellen [name], female    DOB: May 26, 1926, 79 y.o.   MRN: [id number]  HPI 07/26/10- 79 yo never smoker, followed for COPD, allergic rhinitis, complicated by CAD, renal insufficiency, anemia  and GERD Last here February 26, 2010- did have a bronchitis and an episode of gastroenteritis with dehydration, but those have resolved. Felt that pollen bothered her this Spring. Yesterday was more short of breath. Had gone to see Dr Smith/cardiology- she hurried to get there and lost her breath for awhile. He told her she was retaining some fluid. Hgb recently 10- given injection by Dr Myna Hidalgo. Wears oxygen for sleep at 2 L/M. We reviewed her done last year. She had portable O2 in the past but couldn't see well enough for it to be worthwhile and turned it in.  PFT 03/05/10- moderate obstruction FEV1/ FVC 0.65, insignif resp to BD. Unable to perform LV or DLCO.  04/23/11- 79 yo never smoker, followed for COPD, allergic rhinitis, complicated by CAD, renal insufficiency, anemia  and GERD Daughter is here. Not breathing well. [facility] provided light portable pulse oxygen. Some mild cough with occasional scant, thick, white sputum. Had chest pain last week and evaluated by Dr. Smith/cardiology and she understands he felt she was okay. Noticing some confusion, loss of memory. Her primary physician asked her to use oxygen more consistently. Her hematologist is watching chronic anemia. Can't afford Spiriva.  08/23/11- 79 yo never smoker, followed for COPD, allergic rhinitis, complicated by CAD, renal insufficiency, anemia  and GERD Not breathing well today-been more active; having increased SOB Increased shortness of breath for several weeks. No distinct infection but she can't rule out a mild cold at the beginning. Loose cough and hacking. She took some Lasix recently put her feet started swelling.  12/23/11-84 yo never smoker, followed for COPD, allergic rhinitis, complicated by CAD, renal  insufficiency, anemia  and GERD FOLLOWS FOR:SOB with rushed activity or increased anxiety/stress     friend here COPD assessment test (CAT) score 34/40 Cough is productive of clear thick sputum. She continues oxygen at 2 L/[facility]. Sleeps upright in chair but planning to get a hospital bed CXR 08/30/11-reviewed IMPRESSION:  Stable exam. Chronic elevation of the left hemidiaphragm with left  basilar scarring and / or atelectasis. Chronic bronchitic changes.  Original Report Authenticated By: Andreas Newport, M.D.   06/23/12- -79 yo never smoker, followed for COPD, allergic rhinitis, complicated by CAD, renal insufficiency, anemia  and GERD  Daughter here FOLLOWS FOR: increased SOB and wheezing more than usual; daughter states patient had bronchitis couple months ago and had hard time getting over it; Pt states she has noticed more weakness and dizziness. Weak and dizzy for 3 days when she ran out of blood pressure pills. Bronchitis in March was treated by her primary physician. Continues oxygen 2 L/[facility]  ROS-see HPI Constitutional:   No-   weight loss, night sweats, fevers, chills, fatigue, lassitude, + weakness HEENT:   No-  headaches, difficulty swallowing, tooth/dental problems, sore throat,       No-  sneezing, itching, ear ache, nasal congestion, post nasal drip,  CV:  No-   chest pain, orthopnea, PND, swelling in lower extremities, +dizziness, no-palpitations Resp: +  shortness of breath with exertion or at rest.              No- productive cough,  + non-productive cough,  No- coughing up of blood.  No-   change in color of mucus.  No- wheezing.   Skin: No-   rash or lesions. GI:  No-   heartburn, indigestion, abdominal pain, nausea, vomiting GU:  MS:  No-   joint pain or swelling.   Neuro-     nothing unusual Psych:  No- change in mood or affect. No depression or anxiety.  No memory loss.  OBJ- Physical Exam  General- Alert, Oriented, Affect-appropriate, Distress-  none acute, O2 sat 95% on 2 L pulsed. Skin- rash-none, lesions- none, excoriation- none Lymphadenopathy- none Head- atraumatic            Eyes- +indicates vision diminished, PERRLA, conjunctivae and secretions clear            Ears- Hearing, canals-normal            Nose- Clear, no-Septal dev, mucus, polyps, erosion, perforation             Throat- Mallampati II , mucosa clear , drainage- none, tonsils- atrophic Neck- flexible , trachea midline, no stridor , thyroid nl, carotid no bruit Chest - symmetrical excursion , unlabored           Heart/CV- RRR , no murmur , no gallop  , no rub, nl s1 s2                           - JVD+ 1 cm , edema- none, stasis changes- none, varices- none           Lung- +few crackles L>R, wheeze- none, cough- none , dullness-none, rub- none           Chest wall-  Abd-  Br/ Gen/ Rectal- Not done, not indicated Extrem- cyanosis- none, clubbing, none, atrophy- none, strength- nl.+  Neuro- grossly intact to observation, calm

## 2012-06-30 NOTE — Progress Notes (Signed)
Quick Note:  Spoke with patient-states she was not told about labs when called on 06-26-12 about CXR results; I apologized and made sure patient fully understood lab Results. Pt states she is not having any swelling and other than slight SOB at times(which she uses O2 for) she feels fine. Pt understands to call our office if any questions or concerns. ______

## 2012-07-01 ENCOUNTER — Telehealth: Payer: Self-pay | Admitting: Internal Medicine

## 2012-07-01 NOTE — Telephone Encounter (Signed)
Daughter aware of results. She asked if we could mail results to pt. I have confirmed mailing address. I have placed this in the mail and nothing further was needed

## 2012-07-04 DIAGNOSIS — I10 Essential (primary) hypertension: Secondary | ICD-10-CM | POA: Insufficient documentation

## 2012-07-04 NOTE — Assessment & Plan Note (Signed)
BP is a little high but I don't know if this explains her complaints of recent weakness and dizziness. She is to get in touch with her primary physician for blood pressure medication refills as needed, or reassessment.

## 2012-07-04 NOTE — Assessment & Plan Note (Signed)
Plan-chest x-ray, CBC, chemistry

## 2012-07-04 NOTE — Assessment & Plan Note (Signed)
We discussed how anemia contribute to shortness of breath, and also to weakness and dizziness. She does not recognize active blood loss

## 2012-07-16 ENCOUNTER — Ambulatory Visit: Payer: Medicare Other

## 2012-07-16 ENCOUNTER — Other Ambulatory Visit (HOSPITAL_BASED_OUTPATIENT_CLINIC_OR_DEPARTMENT_OTHER): Payer: Medicare Other | Admitting: Lab

## 2012-07-16 ENCOUNTER — Ambulatory Visit (HOSPITAL_BASED_OUTPATIENT_CLINIC_OR_DEPARTMENT_OTHER): Payer: Medicare Other | Admitting: Hematology & Oncology

## 2012-07-16 VITALS — BP 143/52 | HR 74 | Temp 98.2°F | Resp 16 | Ht 60.0 in | Wt 176.0 lb

## 2012-07-16 DIAGNOSIS — D509 Iron deficiency anemia, unspecified: Secondary | ICD-10-CM

## 2012-07-16 DIAGNOSIS — D649 Anemia, unspecified: Secondary | ICD-10-CM

## 2012-07-16 DIAGNOSIS — N289 Disorder of kidney and ureter, unspecified: Secondary | ICD-10-CM

## 2012-07-16 DIAGNOSIS — I509 Heart failure, unspecified: Secondary | ICD-10-CM

## 2012-07-16 LAB — CBC WITH DIFFERENTIAL (CANCER CENTER ONLY)
BASO#: 0 10*3/uL (ref 0.0–0.2)
BASO%: 0.1 % (ref 0.0–2.0)
EOS%: 1.2 % (ref 0.0–7.0)
HCT: 39.3 % (ref 34.8–46.6)
HGB: 12.6 g/dL (ref 11.6–15.9)
LYMPH%: 3.3 % — ABNORMAL LOW (ref 14.0–48.0)
MCH: 30 pg (ref 26.0–34.0)
MCHC: 32.1 g/dL (ref 32.0–36.0)
MONO%: 3.1 % (ref 0.0–13.0)
NEUT%: 92.3 % — ABNORMAL HIGH (ref 39.6–80.0)
RDW: 12.9 % (ref 11.1–15.7)

## 2012-07-16 NOTE — Progress Notes (Signed)
This office note has been dictated.

## 2012-07-17 NOTE — Progress Notes (Signed)
CC:   [name], MD Clinton D. Maple Hudson, MD, FCCP, FACP  DIAGNOSES: 1. Anemia of renal insufficiency. 2. Intermittent iron-deficiency anemia. 3. Congestive heart failure-on chronic oxygen.  CURRENT THERAPY: 1. Aranesp 300 mcg subcu as needed for hemoglobin less than 11. 2. IV iron as indicated.  INTERIM HISTORY:  [name] comes in for her followup.  She is doing okay.  We last saw her 2 months ago.  Since then, she has had no real specific issues.  She is still on oxygen for her congestive heart failure.  Her pro beta natriuretic peptide back 3 weeks ago was 404.  When we saw her back in March, her ferritin was 809 with an iron saturation of 25%.  She has not had any bleeding.  She has had some swelling in her legs. She has had no nausea or vomiting.  She is eating okay.  PHYSICAL EXAMINATION:  General:  This is an elderly white female in no obvious distress.  Vital signs:  Temperature of 98.2, pulse 74, respiratory rate 16, blood pressure 143/52.  Weight is 176.  Head and neck:  Normocephalic, atraumatic skull.  There are no ocular or oral lesions.  There are no palpable cervical or supraclavicular lymph nodes. Lungs:  Some decreased breath sounds at the bases.  She may have some slight crackles in the bases.  No wheezes are noted.  Cardiac:  Regular rate and rhythm with an occasional extra beat.  She has a 1/6 systolic ejection murmur.  Abdomen:  Soft with good bowel sounds.  There is no palpable abdominal mass.  There is no fluid wave.  There is no palpable hepatosplenomegaly.  Extremities:  No clubbing, cyanosis, or edema.  She may have some slight pitting edema in her lower legs.  Neurological:  No focal neurological deficits.  LABORATORY STUDIES:  White cell count is 12.9, hemoglobin 12.6, hematocrit 39.3, platelet count 237,000.  MCV is 94.  IMPRESSION:  [name] is a very charming 79 year old white female whose biggest problem is congestive heart failure.  She  is on chronic oxygen.  I did give her a prescription for an incentive spirometer.  Hopefully, this might help with her lungs and try to help prevent atelectasis and ultimately pneumonia from developing.  She does not need any Aranesp.  I doubt that she will need iron.  We will plan to get her back in another couple of months or so.  I want to try to avoid having her come out here in the hot summer weather, which might be detrimental to her lungs.    ______________________________ Josph Macho, M.D. PRE/MEDQ  D:  07/16/2012  T:  07/17/2012  Job:  [id number]

## 2012-08-13 ENCOUNTER — Ambulatory Visit (HOSPITAL_BASED_OUTPATIENT_CLINIC_OR_DEPARTMENT_OTHER): Payer: Medicare Other | Admitting: Medical

## 2012-08-13 ENCOUNTER — Other Ambulatory Visit (HOSPITAL_BASED_OUTPATIENT_CLINIC_OR_DEPARTMENT_OTHER): Payer: Medicare Other

## 2012-08-13 ENCOUNTER — Ambulatory Visit: Payer: Medicare Other

## 2012-08-13 ENCOUNTER — Other Ambulatory Visit: Payer: Medicare Other | Admitting: Lab

## 2012-08-13 VITALS — BP 173/59 | HR 89 | Temp 98.3°F | Resp 16 | Ht 61.0 in | Wt 171.0 lb

## 2012-08-13 DIAGNOSIS — D509 Iron deficiency anemia, unspecified: Secondary | ICD-10-CM

## 2012-08-13 LAB — IRON AND TIBC
TIBC: 253 ug/dL (ref 250–470)
UIBC: 194 ug/dL (ref 125–400)

## 2012-08-13 LAB — CBC WITH DIFFERENTIAL (CANCER CENTER ONLY)
BASO#: 0.1 10*3/uL (ref 0.0–0.2)
EOS%: 5.2 % (ref 0.0–7.0)
HGB: 11.3 g/dL — ABNORMAL LOW (ref 11.6–15.9)
LYMPH#: 1.8 10*3/uL (ref 0.9–3.3)
MCHC: 31.7 g/dL — ABNORMAL LOW (ref 32.0–36.0)
MONO#: 0.9 10*3/uL (ref 0.1–0.9)
NEUT#: 5 10*3/uL (ref 1.5–6.5)
RBC: 3.78 10*6/uL (ref 3.70–5.32)
WBC: 8.2 10*3/uL (ref 3.9–10.0)

## 2012-08-13 NOTE — Progress Notes (Signed)
Patient comes in today, CBC checked, Aranesp injection not given due to parameters.  Hgb was 11.3 .  Patient made aware, seen by [name], PA.

## 2012-08-13 NOTE — Progress Notes (Signed)
DIAGNOSES: 1. Anemia of renal insufficiency. 2. Intermittent iron-deficiency anemia. 3. Congestive heart failure-on chronic oxygen.  CURRENT THERAPY: 1. Aranesp 300 mcg subcu as needed for hemoglobin less than 11. 2. IV iron as indicated.  INTERIM HISTORY: Ellen [name] presents today for an office followup.  She is doing okay.  She still on oxygen for congestive heart.  Her last BNP back in may was 404.  Her last iron panel back in May revealed a ferritin of 1093, and iron of 41 with 14% saturation.her blood work looks good today.  Her hemoglobin is 11.3.  She will not require an Aranesp injection.  She does report that she's noticed a raised rash bilaterally on her legs for the past few weeks.  She does have an appointment with her dermatologist.  She reports a decent appetite.  She denies any nausea, vomiting, diarrhea or constipation.  She denies any fevers, chills or night sweats.  She denies any chest pain, shortness of breath or cough.  She denies any abdominal pain.  She denies any obvious or abnormal bleeding.  She denies any lower leg swelling.  She denies any headaches or visual changes.    Review of Systems: Constitutional:Negative for malaise/fatigue, fever, chills, weight loss, diaphoresis, activity change, appetite change, and unexpected weight change.  HEENT: Negative for double vision, blurred vision, visual loss, ear pain, tinnitus, congestion, rhinorrhea, epistaxis sore throat or sinus disease, oral pain/lesion, tongue soreness Respiratory: Negative for cough, chest tightness, shortness of breath, wheezing and stridor.  Cardiovascular: Negative for chest pain, palpitations, leg swelling, orthopnea, PND, DOE or claudication Gastrointestinal: Negative for nausea, vomiting, abdominal pain, diarrhea, constipation, blood in stool, melena, hematochezia, abdominal distention, anal bleeding, rectal pain, anorexia and hematemesis.  Genitourinary: Negative for dysuria, frequency, hematuria,   Musculoskeletal: Negative for myalgias, back pain, joint swelling, arthralgias and gait problem.  Skin: Negative for rash, color change, pallor and wound.  Neurological:. Negative for dizziness/light-headedness, tremors, seizures, syncope, facial asymmetry, speech difficulty, weakness, numbness, headaches and paresthesias.  Hematological: Negative for adenopathy. Does not bruise/bleed easily.  Psychiatric/Behavioral:  Negative for depression, no loss of interest in normal activity or change in sleep pattern.   Physical Exam:this is a pleasant 79 year old elderly white female in no obvious distress  Vitals: temperature 98.3 degrees pulse 89 respirations 16 blood pressure 159 weight 171 pounds  HEENT reveals a normocephalic, atraumatic skull, no scleral icterus, no oral lesions  Neck is supple without any cervical or supraclavicular adenopathy.  Lungs are clear to auscultation bilaterally. There are no wheezes, rales or rhonci Cardiac is regular rate and rhythm with a normal S1 and S2. There are no murmurs, rubs, or bruits.  Abdomen is soft with good bowel sounds, there is no palpable mass. There is no palpable hepatosplenomegaly. There is no palpable fluid wave.  Musculoskeletal no tenderness of the spine, ribs, or hips.  Extremities there are no clubbing, cyanosis, or edema.  Skin no petechia, purpura or ecchymosis Neurologic is nonfocal.  Laboratory Data: White count 8.2 hemoglobin 11.3 hematocrit 35.7 platelets 280,000  Current Outpatient Prescriptions on File Prior to Visit  Medication Sig Dispense Refill  . ALPRAZolam (XANAX) 0.25 MG tablet Take 0.25 mg by mouth at bedtime as needed.        [name] aspirin 81 MG tablet Take 81 mg by mouth daily.        . calcium gluconate 500 MG tablet Take 500 mg by mouth daily.       . cholecalciferol (VITAMIN D) 1000 UNITS  tablet Take 2,000 Units by mouth 2 (two) times daily.       [name] esomeprazole (NEXIUM) 40 MG capsule Take 40 mg by mouth daily before  breakfast.        . fexofenadine (ALLEGRA) 60 MG tablet Take 60 mg by mouth daily.        . fish oil-omega-3 fatty acids 1000 MG capsule Take 1 g by mouth daily.        . furosemide (LASIX) 80 MG tablet Take 80 mg by mouth 2 (two) times a week.       [name] gentamicin (GARAMYCIN) 0.3 % ophthalmic solution Place 1 drop into both eyes as needed.       . Grape Seed 50 MG TABS Take by mouth 2 (two) times daily.      [name] ipratropium (ATROVENT) 0.03 % nasal spray 1-2 puffs each nostril up to 3 times daily if needed for watery nose  30 mL  prn  . losartan (COZAAR) 50 MG tablet Take 50 mg by mouth daily.        . Multiple Vitamins-Minerals (EYE VITAMINS) CAPS Take by mouth every morning.      . nadolol (CORGARD) 80 MG tablet Take 80 mg by mouth daily.        [name] NAMENDA XR 28 MG CP24 Take 1 capsule by mouth at bedtime.      . pravastatin (PRAVACHOL) 80 MG tablet Take 1 tablet by mouth Daily.      . traMADol (ULTRAM) 50 MG tablet Take 50 mg by mouth daily.         No current facility-administered medications on file prior to visit.   Assessment/Plan: This is a pleasant 79 year old white female with the following issues:  #1.  Intermittent iron deficiency anemia.  The last time she received IV iron was back in December.  We are rechecking an iron panel on her today.  I would not be surprised if she needs IV iron  #2.  Anemia of renal insufficiency. her hemoglobin is above 11 today.  She will not require an Aranesp injection.    #3.  Congestive heart failure.  She remains on continuous oxygen.  #4.  Followup.  We will follow back up with Ellen [name] in one month but before then should there be questions or concerns.

## 2012-09-17 ENCOUNTER — Ambulatory Visit: Payer: Medicare Other | Admitting: Hematology & Oncology

## 2012-09-17 ENCOUNTER — Ambulatory Visit: Payer: Medicare Other

## 2012-09-17 ENCOUNTER — Other Ambulatory Visit: Payer: Medicare Other | Admitting: Lab

## 2012-09-24 ENCOUNTER — Ambulatory Visit: Payer: Medicare Other

## 2012-09-24 ENCOUNTER — Ambulatory Visit (HOSPITAL_BASED_OUTPATIENT_CLINIC_OR_DEPARTMENT_OTHER): Payer: Medicare Other | Admitting: Hematology & Oncology

## 2012-09-24 ENCOUNTER — Other Ambulatory Visit (HOSPITAL_BASED_OUTPATIENT_CLINIC_OR_DEPARTMENT_OTHER): Payer: Medicare Other | Admitting: Lab

## 2012-09-24 VITALS — BP 144/50 | HR 55 | Temp 98.1°F | Resp 16 | Ht 61.0 in | Wt 169.0 lb

## 2012-09-24 DIAGNOSIS — D509 Iron deficiency anemia, unspecified: Secondary | ICD-10-CM

## 2012-09-24 DIAGNOSIS — L989 Disorder of the skin and subcutaneous tissue, unspecified: Secondary | ICD-10-CM

## 2012-09-24 DIAGNOSIS — N289 Disorder of kidney and ureter, unspecified: Secondary | ICD-10-CM

## 2012-09-24 DIAGNOSIS — D649 Anemia, unspecified: Secondary | ICD-10-CM

## 2012-09-24 LAB — CBC WITH DIFFERENTIAL (CANCER CENTER ONLY)
BASO%: 0.6 % (ref 0.0–2.0)
EOS%: 2.8 % (ref 0.0–7.0)
LYMPH%: 22.2 % (ref 14.0–48.0)
MCHC: 32.4 g/dL (ref 32.0–36.0)
MCV: 94 fL (ref 81–101)
MONO#: 0.7 10*3/uL (ref 0.1–0.9)
MONO%: 9.3 % (ref 0.0–13.0)
Platelets: 233 10*3/uL (ref 145–400)
RDW: 12.8 % (ref 11.1–15.7)
WBC: 7.1 10*3/uL (ref 3.9–10.0)

## 2012-09-24 NOTE — Progress Notes (Signed)
This office note has been dictated.

## 2012-09-25 LAB — FERRITIN CHCC: Ferritin: 905 ng/mL — ABNORMAL HIGH (ref 9–269)

## 2012-09-25 LAB — IRON AND TIBC CHCC
%SAT: 22 % (ref 21–57)
Iron: 60 ug/dL (ref 41–142)
TIBC: 269 ug/dL (ref 236–444)
UIBC: 208 ug/dL (ref 120–384)

## 2012-09-25 NOTE — Progress Notes (Signed)
CC:   Ellen Showers, MD  DIAGNOSES: 1. Anemia of renal insufficiency. 2. Intermittent iron deficiency anemia. 3. Congestive heart failure.  CURRENT THERAPY: 1. Aranesp 300 mcg subcu as needed for hemoglobin less than 11. 2. IV iron as indicated.  INTERIM HISTORY:  Ellen [name] comes in for a followup.  She actually looks fairly well.  The last time we gave her iron was back in December of 2013.  She has done nicely since then.  When we last saw her in June, her iron saturation was 23%.  Her total ferritin was 1344.  She is not on oxygen right now.  She seems to be doing okay with this.  She has had no bleeding.  There has been no change in bowel or bladder habits.  She has had no cough.  She has had no leg swelling.  Overall, her performance status is ECOG stage 3.  PHYSICAL EXAMINATION:  General:  This is an elderly white female in no obvious distress.  Vital signs:  Temperature of 98.1, pulse 55, respiratory rate 16, blood pressure 144/50.  Weight is 169.  Head and neck:  Normocephalic, atraumatic skull.  She has no ocular or oral lesions.  There are no palpable cervical or supraclavicular lymph nodes. Lungs:  Clear bilaterally.  She has no rales, wheezes, or rhonchi. Cardiac:  Regular rate and rhythm with a normal S1 and S2.  She has a 1/6 systolic ejection murmur.  Abdomen:  Soft.  She has good bowel sounds.  There is no fluid wave.  There is no palpable hepatosplenomegaly.  Extremities:  Show some trace edema.  Skin exam: Does show a suspicious lesion on her upper back.  This is a nodular-type lesion.  It is crusty.  It is not bleeding.  It has the appearance of a squamous cell cancer.  LABORATORY STUDIES:  White blood cell count 7.1, hemoglobin 11.6, hematocrit 35.8, platelet count 233.  IMPRESSION:  Ellen [name] is a very charming 79 year old white female with multifactorial anemia.  She is holding her own right now.  She does not need any Aranesp today.  I do not think  she needs any iron.  We will continue to follow along every 6 months or so.  As always, her mortality is going to be based upon her heart, her cardiac status.  Addendum:  Ellen [name] does have a dermatologist.  I pointed out this lesion to her daughter.  She will make sure that this is dealt with by her dermatologist when she sees her in September.    ______________________________ Josph Macho, M.D. PRE/MEDQ  D:  09/24/2012  T:  09/25/2012  Job:  [id number]

## 2012-09-28 ENCOUNTER — Telehealth: Payer: Self-pay | Admitting: Oncology

## 2012-09-28 NOTE — Telephone Encounter (Addendum)
Message copied by Lacie Draft on Mon Sep 28, 2012  4:08 PM ------      Message from: Josph Macho      Created: Fri Sep 25, 2012  6:17 PM       Call dgtr - iron is still ok!!  Cindee Lame ------Spoke with daughter and she asked that we also call [name].

## 2012-10-21 ENCOUNTER — Ambulatory Visit: Payer: Medicare Other | Admitting: Hematology & Oncology

## 2012-10-21 ENCOUNTER — Ambulatory Visit: Payer: Medicare Other

## 2012-10-21 ENCOUNTER — Other Ambulatory Visit: Payer: Medicare Other | Admitting: Lab

## 2012-11-04 ENCOUNTER — Other Ambulatory Visit: Payer: Self-pay

## 2012-11-04 ENCOUNTER — Ambulatory Visit: Payer: Medicare Other | Admitting: Hematology & Oncology

## 2012-11-04 ENCOUNTER — Other Ambulatory Visit: Payer: Medicare Other | Admitting: Lab

## 2012-11-04 ENCOUNTER — Ambulatory Visit: Payer: Medicare Other

## 2012-11-05 ENCOUNTER — Telehealth: Payer: Self-pay | Admitting: Hematology & Oncology

## 2012-11-05 NOTE — Telephone Encounter (Signed)
Patient's daughter [name] called and resch 11/04/12 missed apt for 12/17/12

## 2012-12-17 ENCOUNTER — Ambulatory Visit: Payer: Medicare Other

## 2012-12-17 ENCOUNTER — Ambulatory Visit (HOSPITAL_BASED_OUTPATIENT_CLINIC_OR_DEPARTMENT_OTHER): Payer: Medicare Other | Admitting: Lab

## 2012-12-17 ENCOUNTER — Ambulatory Visit (HOSPITAL_BASED_OUTPATIENT_CLINIC_OR_DEPARTMENT_OTHER): Payer: Medicare Other | Admitting: Hematology & Oncology

## 2012-12-17 DIAGNOSIS — D649 Anemia, unspecified: Secondary | ICD-10-CM

## 2012-12-17 DIAGNOSIS — D509 Iron deficiency anemia, unspecified: Secondary | ICD-10-CM

## 2012-12-17 LAB — CBC WITH DIFFERENTIAL (CANCER CENTER ONLY)
BASO#: 0 10*3/uL (ref 0.0–0.2)
EOS%: 4.7 % (ref 0.0–7.0)
Eosinophils Absolute: 0.3 10*3/uL (ref 0.0–0.5)
HCT: 36.6 % (ref 34.8–46.6)
HGB: 11.5 g/dL — ABNORMAL LOW (ref 11.6–15.9)
LYMPH%: 19.8 % (ref 14.0–48.0)
MCH: 29.8 pg (ref 26.0–34.0)
MCHC: 31.4 g/dL — ABNORMAL LOW (ref 32.0–36.0)
MCV: 95 fL (ref 81–101)
MONO%: 10.7 % (ref 0.0–13.0)
NEUT%: 64.4 % (ref 39.6–80.0)
RBC: 3.86 10*6/uL (ref 3.70–5.32)

## 2012-12-17 LAB — RETICULOCYTES (CHCC)
ABS Retic: 59.4 10*3/uL (ref 19.0–186.0)
Retic Ct Pct: 1.5 % (ref 0.4–2.3)

## 2012-12-17 NOTE — Progress Notes (Signed)
Pt did not need injection per Dr. Myna Hidalgo. Hgb 11.5.

## 2012-12-17 NOTE — Progress Notes (Signed)
This office note has been dictated.

## 2012-12-18 LAB — IRON AND TIBC CHCC
%SAT: 18 % — ABNORMAL LOW (ref 21–57)
Iron: 48 ug/dL (ref 41–142)
TIBC: 269 ug/dL (ref 236–444)

## 2012-12-18 LAB — FERRITIN CHCC: Ferritin: 742 ng/ml — ABNORMAL HIGH (ref 9–269)

## 2012-12-18 NOTE — Progress Notes (Signed)
CC:   [name], MD Lyn Records, M.D.  DIAGNOSES: 1. Anemia of renal insufficiency. 2. Intermittent iron-deficiency anemia. 3. Congestive heart failure.  CURRENT THERAPY: 1. Aranesp 300 mcg subcu as needed for hemoglobin less than 11. 2. IV iron as indicated.  INTERIM HISTORY:  Ellen [name] comes in for followup.  She is actually doing quite well.  We saw her like almost 3 months ago.  She has had no problems since we last saw her.  She has not gotten any Aranesp since December.  When we last saw her, her ferritin was 905 with iron saturation of 22%. She has had no exacerbation of heart failure.  She has had no worsening of her memory.  She is on Namenda, which has helped her.  She has had no bleeding.  She has had no fevers, sweats, or chills.  PHYSICAL EXAMINATION:  General:  This is an elderly white female in no obvious distress.  Vital signs:  Temperature of 98.5, pulse 74, respiratory rate 14, blood pressure 138/48, weight is 176 pounds. Head/Neck:  Shows a normocephalic, atraumatic skull.  There are no ocular or oral lesions.  There are no palpable cervical or supraclavicular lymph nodes.  Lungs:  Clear bilaterally.  Cardiac: Regular rate and rhythm with a normal S1 and S2.  There are no murmurs. She has a 1/6 systolic ejection murmur.  Abdomen:  Soft.  She has good bowel sounds.  There is no fluid wave.  There is no palpable hepatosplenomegaly.  Extremities:  Show some chronic 1+ edema in her lower legs.  She has osteoarthritic changes in her joints. Neurological:  No focal neurological deficits.  Of note, she did have a lesion removed off her back.  This was not found to be an actinic keratosis.  This was done in September 2014.  LABORATORY STUDIES:  White cell count 7.2, hemoglobin 11.5, hematocrit 36.6, platelet count 239,000.  IMPRESSION:  Ellen [name] is a very charming 79 year old white female with multifactorial anemia.  She again is holding her own  incredibly well.  I do not see that we would need to give her anything right now.  We will go ahead and plan to get her back after the holidays now.    ______________________________ Josph Macho, M.D. PRE/MEDQ  D:  12/17/2012  T:  12/18/2012  Job:  [id number]

## 2012-12-22 ENCOUNTER — Telehealth: Payer: Self-pay | Admitting: Nurse Practitioner

## 2012-12-22 ENCOUNTER — Ambulatory Visit (INDEPENDENT_AMBULATORY_CARE_PROVIDER_SITE_OTHER): Payer: Medicare Other | Admitting: Internal Medicine

## 2012-12-22 ENCOUNTER — Encounter: Payer: Self-pay | Admitting: Internal Medicine

## 2012-12-22 VITALS — BP 114/78 | HR 60 | Ht 60.5 in | Wt 175.4 lb

## 2012-12-22 DIAGNOSIS — Z23 Encounter for immunization: Secondary | ICD-10-CM

## 2012-12-22 DIAGNOSIS — J438 Other emphysema: Secondary | ICD-10-CM

## 2012-12-22 DIAGNOSIS — J069 Acute upper respiratory infection, unspecified: Secondary | ICD-10-CM

## 2012-12-22 DIAGNOSIS — J439 Emphysema, unspecified: Secondary | ICD-10-CM

## 2012-12-22 NOTE — Telephone Encounter (Addendum)
Message copied by Glee Arvin on Tue Dec 22, 2012 10:36 AM ------      Message from: Josph Macho      Created: Sun Dec 20, 2012  7:56 PM       Call her dgtr - iron is a little low.  Need Feraheme 510mg  x 1 dose.  Please set up.   Pete ------Pt's daughter verbalized understanding and an appointment has been set for 12/30/12 @ 2pm.

## 2012-12-22 NOTE — Patient Instructions (Signed)
Flu vax- hi dose  We can continue O2 2L / [facility]  Please call as needed

## 2012-12-22 NOTE — Progress Notes (Signed)
Patient ID: Ellen [name], female    DOB: 1926/12/07, 79 y.o.   MRN: [id number]  HPI 07/26/10- 75 yo never smoker, followed for COPD, allergic rhinitis, complicated by CAD, renal insufficiency, anemia  and GERD Last here February 26, 2010- did have a bronchitis and an episode of gastroenteritis with dehydration, but those have resolved. Felt that pollen bothered her this Spring. Yesterday was more short of breath. Had gone to see Dr Smith/cardiology- she hurried to get there and lost her breath for awhile. He told her she was retaining some fluid. Hgb recently 10- given injection by Dr Myna Hidalgo. Wears oxygen for sleep at 2 L/M. We reviewed her done last year. She had portable O2 in the past but couldn't see well enough for it to be worthwhile and turned it in.  PFT 03/05/10- moderate obstruction FEV1/ FVC 0.65, insignif resp to BD. Unable to perform LV or DLCO.  04/23/11- 24 yo never smoker, followed for COPD, allergic rhinitis, complicated by CAD, renal insufficiency, anemia  and GERD Daughter is here. Not breathing well. [facility] provided light portable pulse oxygen. Some mild cough with occasional scant, thick, white sputum. Had chest pain last week and evaluated by Dr. Smith/cardiology and she understands he felt she was okay. Noticing some confusion, loss of memory. Her primary physician asked her to use oxygen more consistently. Her hematologist is watching chronic anemia. Can't afford Spiriva.  08/23/11- 18 yo never smoker, followed for COPD, allergic rhinitis, complicated by CAD, renal insufficiency, anemia  and GERD Not breathing well today-been more active; having increased SOB Increased shortness of breath for several weeks. No distinct infection but she can't rule out a mild cold at the beginning. Loose cough and hacking. She took some Lasix recently put her feet started swelling.  12/23/11-84 yo never smoker, followed for COPD, allergic rhinitis, complicated by CAD, renal  insufficiency, anemia  and GERD FOLLOWS FOR:SOB with rushed activity or increased anxiety/stress     friend here COPD assessment test (CAT) score 34/40 Cough is productive of clear thick sputum. She continues oxygen at 2 L/[facility]. Sleeps upright in chair but planning to get a hospital bed CXR 08/30/11-reviewed IMPRESSION:  Stable exam. Chronic elevation of the left hemidiaphragm with left  basilar scarring and / or atelectasis. Chronic bronchitic changes.  Original Report Authenticated By: Andreas Newport, M.D.   06/23/12- -79 yo never smoker, followed for COPD, allergic rhinitis, complicated by CAD, renal insufficiency, anemia  and GERD  Daughter here FOLLOWS FOR: increased SOB and wheezing more than usual; daughter states patient had bronchitis couple months ago and had hard time getting over it; Pt states she has noticed more weakness and dizziness. Weak and dizzy for 3 days when she ran out of blood pressure pills. Bronchitis in March was treated by her primary physician. Continues oxygen 2 L/[facility]  12/22/12- 60 yo never smoker, followed for COPD, allergic rhinitis, complicated by CAD, renal insufficiency, anemia  and GERD  Daughter here FOLLOWS FOR: recently felt like she had a "cold" x 1 week; PND, and unable to breath. Residual stuffy nose. CXR 06/23/12 IMPRESSION:  No edema or consolidation. Stable elevation of the left  hemidiaphragm. Heart mildly enlarged but stable.  Original Report Authenticated By: Bretta Bang, M.D.  ROS-see HPI Constitutional:   No-   weight loss, night sweats, fevers, chills, fatigue, lassitude, + weakness HEENT:   No-  headaches, difficulty swallowing, tooth/dental problems, sore throat,       No-  sneezing, itching,  ear ache, +nasal congestion, post nasal drip,  CV:  No-   chest pain, orthopnea, PND, swelling in lower extremities, +dizziness, no-palpitations Resp: +  shortness of breath with exertion or at rest.              No- productive cough,  +  non-productive cough,  No- coughing up of blood.              No-   change in color of mucus.  No- wheezing.   Skin: No-   rash or lesions. GI:  No-   heartburn, indigestion, abdominal pain, nausea, vomiting GU:  MS:  No-   joint pain or swelling.   Neuro-     nothing unusual Psych:  No- change in mood or affect. No depression or anxiety.  No memory loss.  OBJ- Physical Exam  General- Alert, Oriented, Affect-appropriate, Distress- none acute, O2 sat 96% on 2 L pulsed. Skin- rash-none, lesions- none, excoriation- none Lymphadenopathy- none Head- atraumatic            Eyes- +indicates vision diminished, PERRLA, conjunctivae and secretions clear            Ears- Hearing, canals-normal            Nose- Clear, no-Septal dev, mucus, polyps, erosion, perforation             Throat- Mallampati II , mucosa clear , drainage- none, tonsils- atrophic Neck- flexible , trachea midline, no stridor , thyroid nl, carotid no bruit Chest - symmetrical excursion , unlabored           Heart/CV- RRR/ occ skip , no murmur , no gallop  , no rub, nl s1 s2                           - JVD+ 1 cm , edema- none, stasis changes- none, varices- none           Lung- clear, wheeze- none, cough- none , dullness-none, rub- none           Chest wall-  Abd-  Br/ Gen/ Rectal- Not done, not indicated Extrem- cyanosis- none, clubbing, none, atrophy- none, strength- nl. Neuro- grossly intact to observation, calm

## 2012-12-30 ENCOUNTER — Ambulatory Visit: Payer: Medicare Other

## 2012-12-30 ENCOUNTER — Telehealth: Payer: Self-pay | Admitting: Hematology & Oncology

## 2012-12-30 NOTE — Telephone Encounter (Signed)
Pt moved 11-12 to 11-17 due to stomach issues. I left RN message in case she needs to triage

## 2013-01-04 ENCOUNTER — Ambulatory Visit (HOSPITAL_BASED_OUTPATIENT_CLINIC_OR_DEPARTMENT_OTHER): Payer: Medicare Other

## 2013-01-04 VITALS — BP 155/63 | HR 65 | Temp 99.4°F | Resp 99

## 2013-01-04 DIAGNOSIS — D509 Iron deficiency anemia, unspecified: Secondary | ICD-10-CM

## 2013-01-04 DIAGNOSIS — N289 Disorder of kidney and ureter, unspecified: Secondary | ICD-10-CM

## 2013-01-04 MED ORDER — FERUMOXYTOL INJECTION 510 MG/17 ML
510.0000 mg | Freq: Once | INTRAVENOUS | Status: AC
  Administered 2013-01-04: 510 mg via INTRAVENOUS
  Filled 2013-01-04: qty 17

## 2013-01-04 MED ORDER — SODIUM CHLORIDE 0.9 % IV SOLN
INTRAVENOUS | Status: DC
  Administered 2013-01-04: 15:00:00 via INTRAVENOUS

## 2013-01-04 NOTE — Patient Instructions (Signed)
Ferumoxytol injection What is this medicine? FERUMOXYTOL is an iron complex. Iron is used to make healthy red blood cells, which carry oxygen and nutrients throughout the body. This medicine is used to treat iron deficiency anemia in people with chronic kidney disease. This medicine may be used for other purposes; ask your health care provider or pharmacist if you have questions. What should I tell my health care provider before I take this medicine? They need to know if you have any of these conditions: -anemia not caused by low iron levels -high levels of iron in the blood -magnetic resonance imaging (MRI) test scheduled -an unusual or allergic reaction to iron, other medicines, foods, dyes, or preservatives -pregnant or trying to get pregnant -breast-feeding How should I use this medicine? This medicine is for infusion into a vein. It is given by a health care professional in a hospital or clinic setting. Talk to your pediatrician regarding the use of this medicine in children. Special care may be needed. Overdosage: If you think you've taken too much of this medicine contact a poison control center or emergency room at once. Overdosage: If you think you have taken too much of this medicine contact a poison control center or emergency room at once. NOTE: This medicine is only for you. Do not share this medicine with others. What if I miss a dose? It is important not to miss your dose. Call your doctor or health care professional if you are unable to keep an appointment. What may interact with this medicine? This medicine may interact with the following medications: -other iron products This list may not describe all possible interactions. Give your health care provider a list of all the medicines, herbs, non-prescription drugs, or dietary supplements you use. Also tell them if you smoke, drink alcohol, or use illegal drugs. Some items may interact with your medicine. What should I watch  for while using this medicine? Visit your doctor or healthcare professional regularly. Tell your doctor or healthcare professional if your symptoms do not start to get better or if they get worse. You may need blood work done while you are taking this medicine. You may need to follow a special diet. Talk to your doctor. Foods that contain iron include: whole grains/cereals, dried fruits, beans, or peas, leafy green vegetables, and organ meats (liver, kidney). What side effects may I notice from receiving this medicine? Side effects that you should report to your doctor or health care professional as soon as possible: -allergic reactions like skin rash, itching or hives, swelling of the face, lips, or tongue -breathing problems -changes in blood pressure -feeling faint or lightheaded, falls -fever or chills -flushing, sweating, or hot feelings -swelling of the ankles or feet Side effects that usually do not require medical attention (Report these to your doctor or health care professional if they continue or are bothersome.): -diarrhea -headache -nausea, vomiting -stomach pain This list may not describe all possible side effects. Call your doctor for medical advice about side effects. You may report side effects to FDA at 1-800-FDA-1088. Where should I keep my medicine? This drug is given in a hospital or clinic and will not be stored at home. NOTE: This sheet is a summary. It may not cover all possible information. If you have questions about this medicine, talk to your doctor, pharmacist, or health care provider.  2012, [employer]. (10/28/2007 9:48:25 PM) 

## 2013-01-06 DIAGNOSIS — J069 Acute upper respiratory infection, unspecified: Secondary | ICD-10-CM | POA: Insufficient documentation

## 2013-01-06 NOTE — Assessment & Plan Note (Addendum)
This is resolving by description was a residual nasal congestion. We discussed indications for antibiotics

## 2013-01-06 NOTE — Assessment & Plan Note (Signed)
She continues oxygen at 2 L continuously. Has been able to fight off her recent cold Plan-flu vaccine, high dose

## 2013-03-04 ENCOUNTER — Ambulatory Visit: Payer: Medicare Other | Admitting: Hematology & Oncology

## 2013-03-04 ENCOUNTER — Other Ambulatory Visit: Payer: Medicare Other | Admitting: Lab

## 2013-03-04 ENCOUNTER — Telehealth: Payer: Self-pay | Admitting: Hematology & Oncology

## 2013-03-04 ENCOUNTER — Ambulatory Visit: Payer: Medicare Other

## 2013-03-04 NOTE — Telephone Encounter (Signed)
Pt is sick moved 1-15 to 1-28 RN aware to check on lab and inj

## 2013-03-17 ENCOUNTER — Other Ambulatory Visit (HOSPITAL_BASED_OUTPATIENT_CLINIC_OR_DEPARTMENT_OTHER): Payer: Medicare Other | Admitting: Lab

## 2013-03-17 ENCOUNTER — Ambulatory Visit (HOSPITAL_BASED_OUTPATIENT_CLINIC_OR_DEPARTMENT_OTHER): Payer: Medicare Other | Admitting: Hematology & Oncology

## 2013-03-17 ENCOUNTER — Ambulatory Visit: Payer: Medicare Other

## 2013-03-17 VITALS — BP 206/180 | HR 72 | Temp 98.0°F | Resp 14 | Ht 63.0 in | Wt 170.0 lb

## 2013-03-17 DIAGNOSIS — D649 Anemia, unspecified: Secondary | ICD-10-CM

## 2013-03-17 DIAGNOSIS — D509 Iron deficiency anemia, unspecified: Secondary | ICD-10-CM

## 2013-03-17 DIAGNOSIS — N289 Disorder of kidney and ureter, unspecified: Secondary | ICD-10-CM

## 2013-03-17 LAB — CBC WITH DIFFERENTIAL (CANCER CENTER ONLY)
BASO#: 0 10*3/uL (ref 0.0–0.2)
BASO%: 0.6 % (ref 0.0–2.0)
EOS ABS: 0.3 10*3/uL (ref 0.0–0.5)
EOS%: 3.5 % (ref 0.0–7.0)
HCT: 38.2 % (ref 34.8–46.6)
HGB: 12.1 g/dL (ref 11.6–15.9)
LYMPH#: 1.4 10*3/uL (ref 0.9–3.3)
LYMPH%: 20 % (ref 14.0–48.0)
MCH: 29.7 pg (ref 26.0–34.0)
MCHC: 31.7 g/dL — ABNORMAL LOW (ref 32.0–36.0)
MCV: 94 fL (ref 81–101)
MONO#: 0.8 10*3/uL (ref 0.1–0.9)
MONO%: 11.6 % (ref 0.0–13.0)
NEUT%: 64.3 % (ref 39.6–80.0)
NEUTROS ABS: 4.6 10*3/uL (ref 1.5–6.5)
Platelets: 215 10*3/uL (ref 145–400)
RBC: 4.08 10*6/uL (ref 3.70–5.32)
RDW: 12.3 % (ref 11.1–15.7)
WBC: 7.2 10*3/uL (ref 3.9–10.0)

## 2013-03-17 NOTE — Progress Notes (Signed)
This office note has been dictated.

## 2013-03-18 NOTE — Progress Notes (Signed)
DIAGNOSES: 1. Anemia of renal insufficiency. 2. Intermittent iron-deficiency anemia. 3. Chronic congestive heart failure.  CURRENT THERAPY: 1. Aranesp 300 mcg as needed for hemoglobin less than 11. 2. IV iron as indicated.  INTERIM HISTORY:  [name] comes in for followup.  We last saw her back in late October.  We did go ahead and give her iron and we saw her.  Her iron back on November 17, she got __________.  At that point in time, her ferritin was 742 with an iron saturation of only 18%.  Again, a lot of her ferritin elevation is acute phase reactant.  She is on chronic oxygen.  This does seem to be helping her.  She does have some dementia issues.  Her birthday is coming up this [birth year].  She will probably be home enjoying this.  She has had no change in her medications.  She has lost a little weight.  PHYSICAL EXAMINATION:  General:  This is an elderly, somewhat frail- appearing white female in no obvious distress.  Vital Signs: Temperature of 98, pulse 72, respiratory rate 14, blood pressure is 162/49.  Weight is 170 pounds.  Head and Neck:  Normocephalic, atraumatic skull.  There are no ocular or oral lesions.  There are no palpable cervical or supraclavicular lymph nodes.  Lungs:  Show some decreased breath sounds at the bases.  Cardiac:  Regular rate and rhythm with normal S1 and S2.  There are no murmurs, rubs, or bruits.  Abdomen: Soft.  She has good bowel sounds.  There is no fluid wave.  There is no palpable hepatosplenomegaly.  Extremities:  Show some trace edema in her lower legs.  This is chronic.  Muscle strength is 4/5 bilaterally.  She has some osteoarthritic changes in her joints.  Neurological:  Shows no focal neurological deficits.  LABORATORY STUDIES:  White cell count 7.2, hemoglobin 12.1, hematocrit 38.2, platelet count 215.  IMPRESSION:  [name] is an 80 year old white female with anemia of renal insufficiency and iron deficiency anemia.  This  is her hemoglobin count __________ the best that has been in a long time.  The iron definitely helped her out.  I think we can probably go 3 months.  I think this would be reasonable so that we do not have to get her back in any potential bad weather.  I am just glad to see that she is going to make it to her 87th birthday.    ______________________________ Josph MachoPeter R Berta Denson, M.D. PRE/MEDQ  D:  03/17/2013  T:  03/18/2013  Job:  [id number]

## 2013-03-23 LAB — TRANSFERRIN RECEPTOR, SOLUABLE: Transferrin Receptor, Soluble: 1.19 mg/L (ref 0.76–1.76)

## 2013-03-29 ENCOUNTER — Telehealth: Payer: Self-pay | Admitting: Nurse Practitioner

## 2013-03-29 NOTE — Telephone Encounter (Addendum)
Message copied by Glee ArvinPICKENPACK-COUSAR, Donelle Hise N on Mon Mar 29, 2013  2:05 PM ------      Message from: Arlan OrganENNEVER, PETER R      Created: Wed Mar 24, 2013  6:58 PM       Call her dgtr - iron is ok!!  Cindee LamePete ------Spoke w/dtr and she verbalized understanding and appreciation.

## 2013-04-19 ENCOUNTER — Telehealth: Payer: Self-pay | Admitting: Internal Medicine

## 2013-04-19 DIAGNOSIS — J449 Chronic obstructive pulmonary disease, unspecified: Secondary | ICD-10-CM

## 2013-04-19 NOTE — Telephone Encounter (Signed)
Ok to order DME O2 change to portable O2 concentrator 2L/ min dx COPD as requested

## 2013-04-19 NOTE — Telephone Encounter (Signed)
Called and spoke with pts daughter and she stated that the pt will be moving to [place] and she will be moving around a lot and the pt and her daughter is wanting to change her oxygen.  She is on the liquid oxygen at this time and is wanting to change to the portable oxygen [facility]  CY please advise. Thanks  Last ov--12/22/2012 Next ov--06/22/2013  Allergies  Allergen Reactions  . Doxycycline   . Clarithromycin     H/A  . Propulsid [Cisapride]   . Prinivil [Lisinopril]      Current Outpatient Prescriptions on File Prior to Visit  Medication Sig Dispense Refill  . ALPRAZolam (XANAX) 0.25 MG tablet Take 0.25 mg by mouth at bedtime as needed.        [name]. aspirin 81 MG tablet Take 81 mg by mouth daily.        . calcium gluconate 500 MG tablet Take 500 mg by mouth daily.       . cholecalciferol (VITAMIN D) 1000 UNITS tablet Take 2,000 Units by mouth 2 (two) times daily.       [name]. esomeprazole (NEXIUM) 40 MG capsule Take 40 mg by mouth daily before breakfast.        . fexofenadine (ALLEGRA) 60 MG tablet Take 60 mg by mouth as needed.       [name]. losartan (COZAAR) 50 MG tablet Take 50 mg by mouth daily.        . meloxicam (MOBIC) 15 MG tablet       . Multiple Vitamins-Minerals (EYE VITAMINS) CAPS Take by mouth every morning.      . nadolol (CORGARD) 80 MG tablet Take 80 mg by mouth daily.        [name]. NAMENDA XR 28 MG CP24 Take 1 capsule by mouth at bedtime.      [name]. NITROSTAT 0.4 MG SL tablet Use as directed as needed      . pravastatin (PRAVACHOL) 80 MG tablet Take 1 tablet by mouth Daily.      . traMADol (ULTRAM) 50 MG tablet Take 50 mg by mouth daily.         No current facility-administered medications on file prior to visit.

## 2013-04-20 NOTE — Telephone Encounter (Signed)
Pt's daughter is aware [facility] Order [facility]. Nothing further is needed at this time.

## 2013-06-14 ENCOUNTER — Ambulatory Visit: Payer: Medicare Other | Admitting: Hematology & Oncology

## 2013-06-14 ENCOUNTER — Ambulatory Visit: Payer: Medicare Other

## 2013-06-14 ENCOUNTER — Other Ambulatory Visit: Payer: Medicare Other | Admitting: Lab

## 2013-06-17 ENCOUNTER — Ambulatory Visit: Payer: Medicare Other | Admitting: Hematology & Oncology

## 2013-06-17 ENCOUNTER — Ambulatory Visit: Payer: Medicare Other

## 2013-06-17 ENCOUNTER — Other Ambulatory Visit: Payer: Medicare Other | Admitting: Lab

## 2013-06-22 ENCOUNTER — Ambulatory Visit (INDEPENDENT_AMBULATORY_CARE_PROVIDER_SITE_OTHER): Payer: Medicare Other | Admitting: Internal Medicine

## 2013-06-22 ENCOUNTER — Encounter: Payer: Self-pay | Admitting: Internal Medicine

## 2013-06-22 ENCOUNTER — Ambulatory Visit (INDEPENDENT_AMBULATORY_CARE_PROVIDER_SITE_OTHER)
Admission: RE | Admit: 2013-06-22 | Discharge: 2013-06-22 | Disposition: A | Discharge status: Home / Self Care | Payer: Medicare Other | Source: Ambulatory Visit | Attending: Internal Medicine | Admitting: Internal Medicine

## 2013-06-22 VITALS — BP 128/70 | HR 72 | Ht 60.5 in | Wt 168.4 lb

## 2013-06-22 DIAGNOSIS — J439 Emphysema, unspecified: Secondary | ICD-10-CM

## 2013-06-22 DIAGNOSIS — I2510 Atherosclerotic heart disease of native coronary artery without angina pectoris: Secondary | ICD-10-CM

## 2013-06-22 DIAGNOSIS — R0902 Hypoxemia: Secondary | ICD-10-CM

## 2013-06-22 DIAGNOSIS — J438 Other emphysema: Secondary | ICD-10-CM

## 2013-06-22 DIAGNOSIS — J449 Chronic obstructive pulmonary disease, unspecified: Secondary | ICD-10-CM

## 2013-06-22 NOTE — Patient Instructions (Signed)
Order- CXR           dx COPD, hypoxia  Resume O2 when you get home. Sit down and rest as needed until you can get back home.

## 2013-06-22 NOTE — Progress Notes (Signed)
Patient ID: Jefm PettyBarbara Ariel, female    DOB: 1926-08-19, 80 y.o.   MRN: [id number]  HPI 07/26/10- 80 yo never smoker, followed for COPD, allergic rhinitis, complicated by CAD, renal insufficiency, anemia  and GERD Last here February 26, 2010- did have a bronchitis and an episode of gastroenteritis with dehydration, but those have resolved. Felt that pollen bothered her this Spring. Yesterday was more short of breath. Had gone to see Dr Smith/cardiology- she hurried to get there and lost her breath for awhile. He told her she was retaining some fluid. Hgb recently 10- given injection by Dr Myna HidalgoEnnever. Wears oxygen for sleep at 2 L/M. We reviewed her 6MWT done last year. She had portable O2 in the past but couldn't see well enough for it to be worthwhile and turned it in.  PFT 03/05/10- moderate obstruction FEV1/ FVC 0.65, insignif resp to BD. Unable to perform LV or DLCO.  04/23/11- 80 yo never smoker, followed for COPD, allergic rhinitis, complicated by CAD, renal insufficiency, anemia  and GERD Daughter is here. Not breathing well. [facility] provided light portable pulse oxygen. Some mild cough with occasional scant, thick, white sputum. Had chest pain last week and evaluated by Dr. Smith/cardiology and she understands he felt she was okay. Noticing some confusion, loss of memory. Her primary physician asked her to use oxygen more consistently. Her hematologist is watching chronic anemia. Can't afford Spiriva.  08/23/11- 80 yo never smoker, followed for COPD, allergic rhinitis, complicated by CAD, renal insufficiency, anemia  and GERD Not breathing well today-been more active; having increased SOB Increased shortness of breath for several weeks. No distinct infection but she can't rule out a mild cold at the beginning. Loose cough and hacking. She took some Lasix recently put her feet started swelling.  12/23/11-80 yo never smoker, followed for COPD, allergic rhinitis, complicated by CAD, renal  insufficiency, anemia  and GERD FOLLOWS FOR:SOB with rushed activity or increased anxiety/stress     friend here COPD assessment test (CAT) score 34/40 Cough is productive of clear thick sputum. She continues oxygen at 2 L/[facility]. Sleeps upright in chair but planning to get a hospital bed CXR 08/30/11-reviewed IMPRESSION:  Stable exam. Chronic elevation of the left hemidiaphragm with left  basilar scarring and / or atelectasis. Chronic bronchitic changes.  Original Report Authenticated By: Andreas NewportGEOFFREY LAMKE, M.D.   06/23/12- -80 yo never smoker, followed for COPD, allergic rhinitis, complicated by CAD, renal insufficiency, anemia  and GERD  Daughter here FOLLOWS FOR: increased SOB and wheezing more than usual; daughter states patient had bronchitis couple months ago and had hard time getting over it; Pt states she has noticed more weakness and dizziness. Weak and dizzy for 3 days when she ran out of blood pressure pills. Bronchitis in March was treated by her primary physician. Continues oxygen 2 L/[facility]  12/22/12- 80 yo never smoker, followed for COPD, allergic rhinitis, complicated by CAD, renal insufficiency, anemia  and GERD  Daughter here FOLLOWS FOR: recently felt like she had a "cold" x 1 week; PND, and unable to breath. Residual stuffy nose. CXR 06/23/12 IMPRESSION:  No edema or consolidation. Stable elevation of the left  hemidiaphragm. Heart mildly enlarged but stable.  Original Report Authenticated By: Bretta BangWilliam Woodruff, M.D.  06/22/13-80 yo never smoker, followed for COPD, allergic rhinitis, elevated left diaphragm, complicated by CAD, renal insufficiency, anemia  and GERD  Daughter here FOLLOWS FOR: Increased SOB with activity-resides in rest home now. Desat to 88% on arrival on  room walking, 92% at rest. No acute events since last here. Occasional weak spells, cough, chest pains and dizziness but no wheeze.  ROS-see HPI Constitutional:   No-   weight loss, night sweats, fevers,  chills, fatigue, lassitude, + weakness HEENT:   No-  headaches, difficulty swallowing, tooth/dental problems, sore throat,       No-  sneezing, itching, ear ache, +nasal congestion, post nasal drip,  CV:  + chest pain, orthopnea, PND, swelling in lower extremities, +dizziness, no-palpitations Resp: +  shortness of breath with exertion or at rest.              No- productive cough,  + non-productive cough,  No- coughing up of blood.              No-   change in color of mucus.  No- wheezing.   Skin: No-   rash or lesions. GI:  No-   heartburn, indigestion, abdominal pain, nausea, vomiting GU:  MS:  No-   joint pain or swelling.   Neuro-     nothing unusual Psych:  No- change in mood or affect. No depression or anxiety.  No memory loss.  OBJ- Physical Exam  General- Alert, Oriented, Affect-appropriate, Distress- none acute, O2 sat 96% on 2 L pulsed. Skin- rash-none, lesions- none, excoriation- none Lymphadenopathy- none Head- atraumatic            Eyes- +indicates vision diminished, PERRLA, conjunctivae and secretions clear            Ears- Hearing, canals-normal            Nose- Clear, no-Septal dev, mucus, polyps, erosion, perforation             Throat- Mallampati II , mucosa clear , drainage- none, tonsils- atrophic Neck- flexible , trachea midline, no stridor , thyroid nl, carotid no bruit Chest - symmetrical excursion , unlabored           Heart/CV- RRR/ occ skip , no murmur , no gallop  , no rub, nl s1 s2                           - JVD-none , edema- none, stasis changes- none, varices- none           Lung- clear, wheeze- none, cough- none , dullness+left base, rub- none           Chest wall-  Abd-  Br/ Gen/ Rectal- Not done, not indicated Extrem- cyanosis- none, clubbing, none, atrophy- none, strength- nl. Neuro- grossly intact to observation, calm

## 2013-06-24 ENCOUNTER — Other Ambulatory Visit: Payer: Self-pay | Admitting: Internal Medicine

## 2013-06-24 DIAGNOSIS — J449 Chronic obstructive pulmonary disease, unspecified: Secondary | ICD-10-CM

## 2013-06-24 DIAGNOSIS — R0902 Hypoxemia: Secondary | ICD-10-CM

## 2013-06-30 ENCOUNTER — Other Ambulatory Visit (HOSPITAL_BASED_OUTPATIENT_CLINIC_OR_DEPARTMENT_OTHER): Payer: Medicare Other | Admitting: Lab

## 2013-06-30 ENCOUNTER — Ambulatory Visit (HOSPITAL_BASED_OUTPATIENT_CLINIC_OR_DEPARTMENT_OTHER): Payer: Medicare Other | Admitting: Hematology & Oncology

## 2013-06-30 VITALS — BP 145/65 | HR 59 | Temp 98.6°F | Resp 16 | Ht 60.5 in | Wt 164.0 lb

## 2013-06-30 DIAGNOSIS — D649 Anemia, unspecified: Secondary | ICD-10-CM

## 2013-06-30 DIAGNOSIS — D509 Iron deficiency anemia, unspecified: Secondary | ICD-10-CM

## 2013-06-30 LAB — CBC WITH DIFFERENTIAL (CANCER CENTER ONLY)
BASO#: 0 10*3/uL (ref 0.0–0.2)
BASO%: 0.3 % (ref 0.0–2.0)
EOS ABS: 0.3 10*3/uL (ref 0.0–0.5)
EOS%: 2.8 % (ref 0.0–7.0)
HEMATOCRIT: 40.4 % (ref 34.8–46.6)
HGB: 13.1 g/dL (ref 11.6–15.9)
LYMPH#: 1.8 10*3/uL (ref 0.9–3.3)
LYMPH%: 19 % (ref 14.0–48.0)
MCH: 30.1 pg (ref 26.0–34.0)
MCHC: 32.4 g/dL (ref 32.0–36.0)
MCV: 93 fL (ref 81–101)
MONO#: 1.1 10*3/uL — ABNORMAL HIGH (ref 0.1–0.9)
MONO%: 11.8 % (ref 0.0–13.0)
NEUT#: 6.3 10*3/uL (ref 1.5–6.5)
NEUT%: 66.1 % (ref 39.6–80.0)
Platelets: 261 10*3/uL (ref 145–400)
RBC: 4.35 10*6/uL (ref 3.70–5.32)
RDW: 12.8 % (ref 11.1–15.7)
WBC: 9.6 10*3/uL (ref 3.9–10.0)

## 2013-06-30 LAB — RETICULOCYTES (CHCC)
ABS RETIC: 61.3 10*3/uL (ref 19.0–186.0)
RBC.: 4.38 MIL/uL (ref 3.87–5.11)
Retic Ct Pct: 1.4 % (ref 0.4–2.3)

## 2013-06-30 NOTE — Progress Notes (Signed)
Hematology and Oncology Follow Up Visit  Ellen PettyBarbara [name] [id number] Nov 09, 1926 80 y.o. 06/30/2013   Principle Diagnosis:  . Anemia of renal insufficiency. 2. Intermittent iron-deficiency anemia. 3. Chronic congestive heart failure.  Current Therapy:   1. Aranesp 300 mcg as needed for hemoglobin less than 11. 2. IV iron as indicated.     Interim History:  Ms.  Ellen [name] is back for followup. She actually looks pretty good. She now is in assisted living. Issues with the wearing oxygen but she doesn't. We last gave her iron back in November. When we last saw her in January, her transferrin receptor was 1.19.  She's had no bleeding. She's had no worsening heart failure issues. She has had pretty stable dementia.  Overall, her performance status was ECOG 3  Medications: Current outpatient prescriptions:ALPRAZolam (XANAX) 0.25 MG tablet, Take 0.25 mg by mouth at bedtime as needed.  , Disp: , Rfl: ;  aspirin 81 MG tablet, Take 81 mg by mouth daily.  , Disp: , Rfl: ;  calcium gluconate 500 MG tablet, Take 500 mg by mouth daily. , Disp: , Rfl: ;  cholecalciferol (VITAMIN D) 1000 UNITS tablet, Take 2,000 Units by mouth 2 (two) times daily. , Disp: , Rfl:  esomeprazole (NEXIUM) 40 MG capsule, Take 40 mg by mouth daily. , Disp: , Rfl: ;  fexofenadine (ALLEGRA) 60 MG tablet, Take 60 mg by mouth daily. , Disp: , Rfl: ;  losartan (COZAAR) 50 MG tablet, Take 50 mg by mouth daily.  , Disp: , Rfl: ;  meloxicam (MOBIC) 15 MG tablet, as needed. , Disp: , Rfl: ;  Multiple Vitamins-Minerals (EYE VITAMINS) CAPS, Take 1 capsule by mouth daily. , Disp: , Rfl:  nadolol (CORGARD) 80 MG tablet, Take 80 mg by mouth daily.  , Disp: , Rfl: ;  NAMENDA XR 28 MG CP24, Take 1 capsule by mouth at bedtime., Disp: , Rfl: ;  NITROSTAT 0.4 MG SL tablet, Use as directed as needed, Disp: , Rfl: ;  pravastatin (PRAVACHOL) 80 MG tablet, Take 1 tablet by mouth Daily., Disp: , Rfl: ;  traMADol (ULTRAM) 50 MG tablet, Take 50 mg by mouth daily.  ,  Disp: , Rfl:   Allergies:  Allergies  Allergen Reactions  . Doxycycline   . Clarithromycin     H/A  . Propulsid [Cisapride]   . Prinivil [Lisinopril]     Past Medical History, Surgical history, Social history, and Family History were reviewed and updated.  Review of Systems: As above  Physical Exam:  height is 5' 0.5" (1.537 m) and weight is 164 lb (74.39 kg). Her oral temperature is 98.6 F (37 C). Her blood pressure is 145/65 and her pulse is 59. Her respiration is 16 and oxygen saturation is 95%.   Elderly white female. Lungs are with decreased breath sounds at the bases. Cardiac exam regular rate and rhythm with an occasional extra beat. She has a 1/6 systolic murmur. Abdomen is soft. She has no fluid wave. There is no palpable liver or spleen tip. Back exam no tenderness over the spine. Extremities shows chronic nonpitting edema of the lower legs.  Lab Results  Component Value Date   WBC 9.6 06/30/2013   HGB 13.1 06/30/2013   HCT 40.4 06/30/2013   MCV 93 06/30/2013   PLT 261 06/30/2013     Chemistry      Component Value Date/Time   NA 136 06/23/2012 1423   K 4.1 06/23/2012 1423   CL 97 06/23/2012 1423  CO2 35* 06/23/2012 1423   BUN 8 06/23/2012 1423   CREATININE 0.7 06/23/2012 1423      Component Value Date/Time   CALCIUM 9.0 06/23/2012 1423   ALKPHOS 76 06/01/2009 1508   AST 19 06/01/2009 1508   ALT 13 06/01/2009 1508   BILITOT 0.9 06/01/2009 1508         Impression and Plan: [name] is a 80-year-old white female with multi-factorial anemia. She is really doing well with her anemia today. She is not even close to being anemic.  I think we can probably get her through the summer time now. It can be tough to get her here.   Ellen MachoPeter R Ennever, MD 5/13/20154:44 PM

## 2013-07-01 LAB — IRON AND TIBC CHCC
%SAT: 15 % — ABNORMAL LOW (ref 21–57)
Iron: 40 ug/dL — ABNORMAL LOW (ref 41–142)
TIBC: 273 ug/dL (ref 236–444)
UIBC: 233 ug/dL (ref 120–384)

## 2013-07-01 LAB — FERRITIN CHCC: Ferritin: 1241 ng/ml — ABNORMAL HIGH (ref 9–269)

## 2013-07-02 ENCOUNTER — Telehealth: Payer: Self-pay | Admitting: *Deleted

## 2013-07-02 NOTE — Telephone Encounter (Addendum)
Message copied by Burnett CorrenteUMLEY, Sherilee Smotherman L on Fri Jul 02, 2013 10:36 AM ------      Message from: Josph MachoENNEVER, PETER R      Created: Fri Jul 02, 2013  6:43 AM       Call dgtr - iron is a little low.  We need to give 1 dose of iron - Feraheme 510mg  - if we want to keep her blood up over the summer.  Please set up in 2-3 weeks.  pete ------Left voicemail informing pt's daughter that the pt needs IV iron in the next 2-3 weeks. Informed the daughter to call our scheduler.

## 2013-07-14 ENCOUNTER — Ambulatory Visit (HOSPITAL_BASED_OUTPATIENT_CLINIC_OR_DEPARTMENT_OTHER): Payer: Medicare Other

## 2013-07-14 VITALS — BP 162/78 | HR 102 | Temp 97.8°F | Resp 12 | Wt 169.0 lb

## 2013-07-14 DIAGNOSIS — D509 Iron deficiency anemia, unspecified: Secondary | ICD-10-CM

## 2013-07-14 MED ORDER — SODIUM CHLORIDE 0.9 % IV SOLN
510.0000 mg | Freq: Once | INTRAVENOUS | Status: AC
  Administered 2013-07-14: 510 mg via INTRAVENOUS
  Filled 2013-07-14: qty 17

## 2013-07-14 MED ORDER — SODIUM CHLORIDE 0.9 % IV SOLN
INTRAVENOUS | Status: DC
  Administered 2013-07-14: 15:00:00 via INTRAVENOUS

## 2013-07-14 NOTE — Patient Instructions (Signed)

## 2013-07-27 ENCOUNTER — Encounter: Payer: Self-pay | Admitting: Interventional Cardiology

## 2013-07-31 ENCOUNTER — Encounter: Payer: Self-pay | Admitting: Internal Medicine

## 2013-07-31 NOTE — Assessment & Plan Note (Addendum)
Controlled. She left her oxygen at home and is to go home and resume it as discussed

## 2013-07-31 NOTE — Assessment & Plan Note (Signed)
Plan-discuss chest pains with primary physician

## 2013-08-24 ENCOUNTER — Ambulatory Visit (INDEPENDENT_AMBULATORY_CARE_PROVIDER_SITE_OTHER)
Admission: RE | Admit: 2013-08-24 | Discharge: 2013-08-24 | Disposition: A | Discharge status: Home / Self Care | Payer: Medicare Other | Source: Ambulatory Visit | Attending: Internal Medicine | Admitting: Internal Medicine

## 2013-08-24 ENCOUNTER — Ambulatory Visit: Payer: Medicare Other | Admitting: Interventional Cardiology

## 2013-08-24 DIAGNOSIS — R0902 Hypoxemia: Secondary | ICD-10-CM

## 2013-08-24 DIAGNOSIS — J449 Chronic obstructive pulmonary disease, unspecified: Secondary | ICD-10-CM

## 2013-08-26 ENCOUNTER — Other Ambulatory Visit: Payer: Self-pay | Admitting: Internal Medicine

## 2013-08-26 ENCOUNTER — Ambulatory Visit (INDEPENDENT_AMBULATORY_CARE_PROVIDER_SITE_OTHER)
Admission: RE | Admit: 2013-08-26 | Discharge: 2013-08-26 | Disposition: A | Discharge status: Home / Self Care | Payer: Medicare Other | Source: Ambulatory Visit | Attending: Internal Medicine | Admitting: Internal Medicine

## 2013-08-26 ENCOUNTER — Other Ambulatory Visit (INDEPENDENT_AMBULATORY_CARE_PROVIDER_SITE_OTHER): Payer: Medicare Other

## 2013-08-26 ENCOUNTER — Telehealth: Payer: Self-pay | Admitting: Internal Medicine

## 2013-08-26 DIAGNOSIS — R918 Other nonspecific abnormal finding of lung field: Secondary | ICD-10-CM

## 2013-08-26 LAB — BASIC METABOLIC PANEL
BUN: 11 mg/dL (ref 6–23)
CO2: 33 meq/L — AB (ref 19–32)
Calcium: 9.5 mg/dL (ref 8.4–10.5)
Chloride: 93 mEq/L — ABNORMAL LOW (ref 96–112)
Creatinine, Ser: 0.8 mg/dL (ref 0.4–1.2)
GFR: 76.79 mL/min (ref >60.00)
Glucose, Bld: 176 mg/dL — ABNORMAL HIGH (ref 70–99)
POTASSIUM: 4.2 meq/L (ref 3.5–5.1)
SODIUM: 133 meq/L — AB (ref 135–145)

## 2013-08-26 MED ORDER — IOHEXOL 300 MG/ML  SOLN
80.0000 mL | Freq: Once | INTRAMUSCULAR | Status: AC | PRN
  Administered 2013-08-26: 80 mL via INTRAVENOUS

## 2013-08-26 NOTE — Telephone Encounter (Signed)
Per CY-noted and let patient leave. Rose was informed and patient left.

## 2013-08-26 NOTE — Telephone Encounter (Signed)
Rose called back-patient stayed for her CT chest and CY aware.

## 2013-08-27 ENCOUNTER — Telehealth: Payer: Self-pay | Admitting: Internal Medicine

## 2013-08-27 MED ORDER — AZITHROMYCIN 250 MG PO TABS: ORAL_TABLET | ORAL | Status: DC

## 2013-08-27 NOTE — Telephone Encounter (Signed)
CT scan of chest shows numerous mucus plugs in airways, consistent with bronchitis. Please suggest we try Rx augmentn 500 mg, # 14, 1 twice daily

## 2013-08-27 NOTE — Telephone Encounter (Signed)
Could try Z pak instead of augmentin. Would need to over-ride computer warning about intolerance of clarithromycin due to headache- not a true allergy

## 2013-08-27 NOTE — Telephone Encounter (Signed)
Called and spoke with pts daughter and she is aware of zpak that has been sent to the pharmacy for the pt.  Will the pt need to follow up with CY once she done with  The abx to make sure everything has cleared?  CY please advise. thanks

## 2013-08-27 NOTE — Telephone Encounter (Signed)
Ok to keep the appointment scheduled in the Fall, unless needed sooner.

## 2013-08-27 NOTE — Telephone Encounter (Signed)
Spoke with Debbie(daughter) and she is aware that I have sent results and message to CY to advise on and we will call her this afternoon with results.  CY Please advise. Thanks.

## 2013-08-27 NOTE — Telephone Encounter (Signed)
Pt's daughter, Eunice BlaseDebbie, returned triage's call & can be reached at [phone].  Antionette FairyHolly D Pryor

## 2013-08-27 NOTE — Telephone Encounter (Signed)
Called spoke with daughter. She is aware of results. She reports pt is very forgetful and will not remember to take abx bid. She wants to know if we can call something in that is QD into [place]. Please advise Dr. Maple HudsonYoung thanks  Allergies  Allergen Reactions  . Doxycycline   . Clarithromycin     H/A  . Propulsid [Cisapride]   . Prinivil [Lisinopril]

## 2013-08-27 NOTE — Telephone Encounter (Signed)
lmtcb x1 for Debbie. 

## 2013-08-30 NOTE — Telephone Encounter (Signed)
Called, spoke with pt's daughter, Eunice BlaseDebbie. Informed her of below per CY. She verbalized understanding and confirmed pt's pending appt in Nov with CY.  She is aware to call back if needed prior to this.  She voiced no further questions or concerns at this time.

## 2013-09-06 ENCOUNTER — Other Ambulatory Visit: Payer: Self-pay | Admitting: Internal Medicine

## 2013-09-06 ENCOUNTER — Ambulatory Visit
Admission: RE | Admit: 2013-09-06 | Discharge: 2013-09-06 | Disposition: A | Discharge status: Home / Self Care | Payer: Medicare Other | Source: Ambulatory Visit | Attending: Internal Medicine | Admitting: Internal Medicine

## 2013-09-06 DIAGNOSIS — R0602 Shortness of breath: Secondary | ICD-10-CM

## 2013-09-20 ENCOUNTER — Encounter: Payer: Self-pay | Admitting: Interventional Cardiology

## 2013-09-20 ENCOUNTER — Ambulatory Visit (INDEPENDENT_AMBULATORY_CARE_PROVIDER_SITE_OTHER): Payer: Medicare Other | Admitting: Interventional Cardiology

## 2013-09-20 VITALS — BP 230/86 | HR 74 | Ht 62.0 in | Wt 166.0 lb

## 2013-09-20 DIAGNOSIS — G5682 Other specified mononeuropathies of left upper limb: Secondary | ICD-10-CM

## 2013-09-20 DIAGNOSIS — G5680 Other specified mononeuropathies of unspecified upper limb: Secondary | ICD-10-CM

## 2013-09-20 DIAGNOSIS — I5032 Chronic diastolic (congestive) heart failure: Secondary | ICD-10-CM

## 2013-09-20 DIAGNOSIS — I2510 Atherosclerotic heart disease of native coronary artery without angina pectoris: Secondary | ICD-10-CM

## 2013-09-20 DIAGNOSIS — I10 Essential (primary) hypertension: Secondary | ICD-10-CM

## 2013-09-20 MED ORDER — FUROSEMIDE 40 MG PO TABS: 40.0000 mg | ORAL_TABLET | Freq: Every day | ORAL | Status: DC

## 2013-09-20 NOTE — Patient Instructions (Signed)
Your physician has recommended you make the following change in your medication: 1. Start Lasix 40 MG 1 tablet daily  Your physician recommends that you return for lab work in: 10 days for a BMET when she comes back in to see Dr Katrinka BlazingSmith  Your physician recommends that you schedule a follow-up appointment in: 10 days with Dr Katrinka BlazingSmith

## 2013-09-20 NOTE — Progress Notes (Signed)
Patient ID: Ellen [name], female   DOB: 1926/06/06, 80 y.o.   MRN: [id number]    [address], [address], [place]  [address] Phone: [phone] Fax:  [phone]  Date:  09/20/2013   ID:  Ellen [name], DOB 08-22-1926, MRN [id number]  PCP:  Myrtis Ser, MD   ASSESSMENT:  1. Coronary artery disease with prior bypass surgery 2. Hypertension, poorly controlled 3. 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 Right diastolic heart failure  PLAN:  1. the elevated blood pressure is concerning. She has peripheral edema. We'll add furosemide 40 mg per day. 2. Office visit in 10 days with be met and followup blood pressure   SUBJECTIVE: Ellen [name] is a 80 y.o. female who is in today for her yearly followup. She has no cardiopulmonary complaints. Blood pressure is noted to be elevated today above 230 with the small cuff and 195 with a large cuff. She is on chronic O2. She denies chest pain. Lower extremity edema has been noted. She is sedentary. Memory loss is occurring and recent medication adjustments have been made by Dr. Tonita Phoenix. The patient denies orthopnea PND   Wt Readings from Last 3 Encounters:  09/20/13 166 lb (75.297 kg)  07/14/13 169 lb (76.658 kg)  06/30/13 164 lb (74.39 kg)     Past Medical History  Diagnosis Date  . Chronic airway obstruction, not elsewhere classified   . Allergic rhinitis, cause unspecified   . Esophageal reflux   . Iron deficiency anemia, unspecified   . Coronary atherosclerosis of unspecified type of vessel, native or graft   . Dysfunction of eustachian tube   . Obesity, unspecified   . Macular degeneration, right eye Being treated.  . Macular degeneration, left eye Blind     Current Outpatient Prescriptions  Medication Sig Dispense Refill  . ALPRAZolam (XANAX) 0.25 MG tablet Take 0.25 mg by mouth at bedtime as needed.        [name] aspirin 81 MG tablet Take 81 mg by mouth daily.        . calcium gluconate 500 MG  tablet Take 500 mg by mouth daily.       . cholecalciferol (VITAMIN D) 1000 UNITS tablet Take 2,000 Units by mouth 2 (two) times daily.       [name] donepezil (ARICEPT) 5 MG tablet Take 5 mg by mouth at bedtime.      [name] esomeprazole (NEXIUM) 40 MG capsule Take 40 mg by mouth daily.       . fexofenadine (ALLEGRA) 60 MG tablet Take 60 mg by mouth daily.       [name] losartan (COZAAR) 50 MG tablet Take 50 mg by mouth daily.        . meloxicam (MOBIC) 15 MG tablet as needed.       . Multiple Vitamins-Minerals (EYE VITAMINS) CAPS Take 1 capsule by mouth daily.       . nadolol (CORGARD) 80 MG tablet Take 80 mg by mouth daily.        [name] NAMENDA XR 28 MG CP24 Take 1 capsule by mouth at bedtime.      [name] NITROSTAT 0.4 MG SL tablet Use as directed as needed      . pravastatin (PRAVACHOL) 80 MG tablet Take 1 tablet by mouth Daily.      . traMADol (ULTRAM) 50 MG tablet Take 50 mg by mouth daily.         No current facility-administered medications for this visit.  Allergies:    Allergies  Allergen Reactions  . Doxycycline   . Clarithromycin     H/A  . Propulsid [Cisapride]   . Prinivil [Lisinopril]     Social History:  The patient  reports that she has never smoked. She does not have any smokeless tobacco history on file.   ROS:  Please see the history of present illness.   No palpitations, syncope, or angina. No nitroglycerin use.   All other systems reviewed and negative.   OBJECTIVE: VS:  BP 230/86  Pulse 74  Ht 5' 2" (1.575 m)  Wt 166 lb (75.297 kg)  BMI 30.35 kg/m2 BP is 190/40 with the large cuff. The marked elevation of systolic pressure is likely related to noncompressible vessels.  Well nourished, well developed, in no acute distress, elderly and frail HEENT: normal Neck: JVD flat. Carotid bruit absent  Cardiac:  normal S1, S2; RRR; no murmur Lungs:  clear to auscultation bilaterally, no wheezing, rhonchi or rales Abd: soft, nontender, no hepatomegaly Ext: Edema 1+ bilateral. Pulses 2+  and bounding Skin: warm and dry Neuro:  CNs 2-12 intact, no focal abnormalities noted  EKG:  Normal sinus rhythm with an occasional PAC       Signed, Illene Labrador III, MD 09/20/2013 3:52 PM

## 2013-09-28 ENCOUNTER — Encounter: Payer: Self-pay | Admitting: Interventional Cardiology

## 2013-09-28 ENCOUNTER — Ambulatory Visit (INDEPENDENT_AMBULATORY_CARE_PROVIDER_SITE_OTHER): Payer: Medicare Other | Admitting: Interventional Cardiology

## 2013-09-28 ENCOUNTER — Other Ambulatory Visit (INDEPENDENT_AMBULATORY_CARE_PROVIDER_SITE_OTHER): Payer: Medicare Other

## 2013-09-28 VITALS — BP 160/65 | HR 55 | Ht 62.0 in | Wt 162.0 lb

## 2013-09-28 DIAGNOSIS — I2510 Atherosclerotic heart disease of native coronary artery without angina pectoris: Secondary | ICD-10-CM

## 2013-09-28 DIAGNOSIS — I10 Essential (primary) hypertension: Secondary | ICD-10-CM

## 2013-09-28 DIAGNOSIS — I5032 Chronic diastolic (congestive) heart failure: Secondary | ICD-10-CM

## 2013-09-28 LAB — BASIC METABOLIC PANEL
BUN: 15 mg/dL (ref 6–23)
CO2: 32 mEq/L (ref 19–32)
Calcium: 9.1 mg/dL (ref 8.4–10.5)
Chloride: 93 mEq/L — ABNORMAL LOW (ref 96–112)
Creatinine, Ser: 0.8 mg/dL (ref 0.4–1.2)
GFR: 72.37 mL/min (ref >60.00)
GLUCOSE: 142 mg/dL — AB (ref 70–99)
POTASSIUM: 4.1 meq/L (ref 3.5–5.1)
SODIUM: 136 meq/L (ref 135–145)

## 2013-09-28 NOTE — Progress Notes (Signed)
Patient ID: Ellen [name], female   DOB: 112-29-1928, 80 y.o.   MRN: [id number]    [address] [place], [place]  [id number] Phone: [phone] Fax:  [phone]) [phone]  Date:  09/28/2013   ID:  Ellen [name], DOB 112-29-1928, MRN [id number]  PCP:  Elby ShowersWALSH,CATHERINE, MD   ASSESSMENT:  1. Hypertension, markedly improved 2. Lower extremity edema, improved and there's been a 4 pound weight loss since last office visit 3. Vertiginous quality dizziness  PLAN:  1. Continue the current medical regimen. 2. Check today's basic metabolic panel to rule out dehydration/renal impairment related to aggressive diuresis 3 pericardial followup in one year SUBJECTIVE: Ellen [name] is a 80 y.o. female  who is here for followup after starting a more aggressive diuretic regimen. There's been a 4 pound weight loss. No change in breathing. [place] edema has improved. Blood pressures at her nursing home have been improved according to the daughter. Blood work is ordered and drawn the day. Results not yet known.   Wt Readings from Last 3 Encounters:  09/28/13 162 lb (73.483 kg)  09/20/13 166 lb (75.297 kg)  07/14/13 169 lb (76.658 kg)     Past Medical History  Diagnosis Date  . Chronic airway obstruction, not elsewhere classified   . Allergic rhinitis, cause unspecified   . Esophageal reflux   . Iron deficiency anemia, unspecified   . Coronary atherosclerosis of unspecified type of vessel, native or graft   . Dysfunction of eustachian tube   . Obesity, unspecified   . Macular degeneration, right eye Being treated.  . Macular degeneration, left eye Blind     Current Outpatient Prescriptions  Medication Sig Dispense Refill  . ALPRAZolam (XANAX) 0.25 MG tablet Take 0.25 mg by mouth at bedtime as needed.        [name]. aspirin 81 MG tablet Take 81 mg by mouth daily.        . calcium gluconate 500 MG tablet Take 500 mg by mouth daily.       . cholecalciferol (VITAMIN D) 1000 UNITS tablet Take  2,000 Units by mouth 2 (two) times daily.       [name]. donepezil (ARICEPT) 5 MG tablet Take 5 mg by mouth at bedtime.      [name]. esomeprazole (NEXIUM) 40 MG capsule Take 40 mg by mouth daily.       . fexofenadine (ALLEGRA) 60 MG tablet Take 60 mg by mouth daily.       . furosemide (LASIX) 40 MG tablet Take 1 tablet (40 mg total) by mouth daily.  90 tablet  3  . losartan (COZAAR) 50 MG tablet Take 50 mg by mouth daily.        . meloxicam (MOBIC) 15 MG tablet as needed.       . Multiple Vitamins-Minerals (EYE VITAMINS) CAPS Take 1 capsule by mouth daily.       . nadolol (CORGARD) 80 MG tablet Take 80 mg by mouth daily.        [name]. NAMENDA XR 28 MG CP24 Take 1 capsule by mouth at bedtime.      [name]. NITROSTAT 0.4 MG SL tablet Use as directed as needed      . pravastatin (PRAVACHOL) 80 MG tablet Take 1 tablet by mouth Daily.      . sertraline (ZOLOFT) 25 MG tablet Take 25 mg by mouth daily.      . traMADol (ULTRAM) 50 MG tablet Take 50 mg by mouth daily.  No current facility-administered medications for this visit.    Allergies:    Allergies  Allergen Reactions  . Doxycycline   . Clarithromycin     H/A  . Propulsid [Cisapride]   . Prinivil [Lisinopril]     Social History:  The patient  reports that she has never smoked. She does not have any smokeless tobacco history on file.   ROS:  Please see the history of present illness.   No syncope. Continued dizziness   All other systems reviewed and negative.   OBJECTIVE: VS:  BP 160/65  Pulse 55  Ht 5\' 2"  (1.575 m)  Wt 162 lb (73.483 kg)  BMI 29.62 kg/m2 Well nourished, well developed, in no acute distress,  elderly and frail HEENT: normal Neck: JVD  flat. Carotid bruit  abscess  Cardiac:  normal S1, S2; RRR; no murmur Lungs:  clear to auscultation bilaterally, no wheezing, rhonchi or rales Abd: soft, nontender, no hepatomegaly Ext: Edema  absent. Pulses  2+ Skin: warm and dry Neuro:  CNs 2-12 intact, no focal abnormalities noted  EKG:    Not repeated       Signed, Darci Needle III, MD 09/28/2013 12:39 PM

## 2013-09-28 NOTE — Patient Instructions (Signed)
Your physician recommends that you continue on your current medications as directed. Please refer to the Current Medication list given to you today.  Your physician wants you to follow-up in: 1 year with Dr.Smith You will receive a reminder letter in the mail two months in advance. If you don't receive a letter, please call our office to schedule the follow-up appointment.  

## 2013-10-08 ENCOUNTER — Telehealth: Payer: Self-pay

## 2013-10-08 NOTE — Telephone Encounter (Signed)
pt daugter aware of lab results.The patient's labs is stable on the current diuretic regimen. No changes were necessary.pt daughter verbalized understanding.

## 2013-10-08 NOTE — Telephone Encounter (Signed)
Message copied by Jarvis NewcomerPARRIS-GODLEY, Riyan Haile S on Fri Oct 08, 2013  2:31 PM ------      Message from: Verdis PrimeSMITH, HENRY      Created: Wed Sep 29, 2013  4:12 PM       The patient's labs is stable on the current diuretic regimen. No changes were necessary. ------

## 2013-11-03 ENCOUNTER — Encounter: Payer: Self-pay | Admitting: Hematology & Oncology

## 2013-11-03 ENCOUNTER — Other Ambulatory Visit (HOSPITAL_BASED_OUTPATIENT_CLINIC_OR_DEPARTMENT_OTHER): Payer: Medicare Other | Admitting: Lab

## 2013-11-03 ENCOUNTER — Ambulatory Visit (HOSPITAL_BASED_OUTPATIENT_CLINIC_OR_DEPARTMENT_OTHER): Payer: Medicare Other | Admitting: Family

## 2013-11-03 VITALS — BP 177/55 | HR 53 | Temp 98.0°F | Resp 14 | Ht 62.0 in | Wt 159.0 lb

## 2013-11-03 DIAGNOSIS — D509 Iron deficiency anemia, unspecified: Secondary | ICD-10-CM | POA: Diagnosis not present

## 2013-11-03 DIAGNOSIS — I2510 Atherosclerotic heart disease of native coronary artery without angina pectoris: Secondary | ICD-10-CM

## 2013-11-03 LAB — CBC WITH DIFFERENTIAL (CANCER CENTER ONLY)
BASO#: 0 10*3/uL (ref 0.0–0.2)
BASO%: 0.5 % (ref 0.0–2.0)
EOS ABS: 0.3 10*3/uL (ref 0.0–0.5)
EOS%: 3.6 % (ref 0.0–7.0)
HEMATOCRIT: 37.2 % (ref 34.8–46.6)
HGB: 12.1 g/dL (ref 11.6–15.9)
LYMPH#: 1.5 10*3/uL (ref 0.9–3.3)
LYMPH%: 19.3 % (ref 14.0–48.0)
MCH: 31.1 pg (ref 26.0–34.0)
MCHC: 32.5 g/dL (ref 32.0–36.0)
MCV: 96 fL (ref 81–101)
MONO#: 0.8 10*3/uL (ref 0.1–0.9)
MONO%: 9.7 % (ref 0.0–13.0)
NEUT#: 5.2 10*3/uL (ref 1.5–6.5)
NEUT%: 66.9 % (ref 39.6–80.0)
Platelets: 252 10*3/uL (ref 145–400)
RBC: 3.89 10*6/uL (ref 3.70–5.32)
RDW: 12.3 % (ref 11.1–15.7)
WBC: 7.8 10*3/uL (ref 3.9–10.0)

## 2013-11-03 NOTE — Progress Notes (Signed)
[facility]  [phone] [phone]  ID: Ellen [name] OB: [birth year] MR#: [id number] HQI#:[id number] Patient Care Team: Orland Penman, MD as PCP - General (Internal Medicine)  DIAGNOSIS: 1. Anemia of renal insufficiency.  2. Intermittent iron-deficiency anemia.  3. Chronic congestive heart failure.  INTERVAL HISTORY: [name] is here today for a follow-up. She is feeling pretty good. She is having dizzy spells at times. Her PCP is managing this. She also is supposed to be wearing O2 24 hours a day but is only wearing it occassionally. We discussed this and she is going to start wearing her O@ as instructed and see if it helps. She is still in assisted living. She denies fever, chills, n/v, cough, rash, headache, chest pain, palpitations, abdominal pain, constipation, diarrhea, blood in urine or stool. She has had no swelling, tenderness, numbness or tingling in her extremities. Her appetite is good and she is drinking plenty of fluids. In January, her transferrin receptor was 1.19. Her CBC today was normal. Her dementia seems to be stable at this time.   CURRENT TREATMENT: 1. Aranesp 300 mcg as needed for hemoglobin less than 11.  2. IV iron as indicated.  REVIEW OF SYSTEMS: All other 10 point review of systems is negative.   PAST MEDICAL HISTORY: Past Medical History  Diagnosis Date  . Chronic airway obstruction, not elsewhere classified   . Allergic rhinitis, cause unspecified   . Esophageal reflux   . Iron deficiency anemia, unspecified   . Coronary atherosclerosis of unspecified type of vessel, native or graft   . Dysfunction of eustachian tube   . Obesity, unspecified   . Macular degeneration, right eye Being treated.  . Macular degeneration, left eye Blind     PAST SURGICAL HISTORY: Past Surgical History  Procedure Laterality Date  . Coronary artery bypass graft      FAMILY HISTORY Family History  Problem Relation Age of  Onset  . Colon cancer Brother   . Breast cancer Sister   . Heart disease Father    GYNECOLOGIC HISTORY:  No LMP recorded. Patient is postmenopausal.   SOCIAL HISTORY:  History   Social History  . Marital Status: Widowed    Spouse Name: N/A    Number of Children: N/A  . Years of Education: N/A   Occupational History  . Retired    Social History Main Topics  . Smoking status: Never Smoker   . Smokeless tobacco: Never Used     Comment: Never used tobacco  . Alcohol Use: Not on file  . Drug Use: Not on file  . Sexual Activity: Not on file   Other Topics Concern  . Not on file   Social History Narrative  . No narrative on file   ADVANCED DIRECTIVES: <no information>  HEALTH MAINTENANCE: History  Substance Use Topics  . Smoking status: Never Smoker   . Smokeless tobacco: Never Used     Comment: Never used tobacco  . Alcohol Use: Not on file   Colonoscopy: PAP: Bone density: Lipid panel:  Allergies  Allergen Reactions  . Doxycycline   . Clarithromycin     H/A  . Propulsid [Cisapride]   . Prinivil [Lisinopril]     Current Outpatient Prescriptions  Medication Sig Dispense Refill  . ALPRAZolam (XANAX) 0.25 MG tablet Take 0.25 mg by mouth at bedtime as needed.        [name] aspirin 81 MG tablet Take 81 mg by mouth daily.        [name]  calcium gluconate 500 MG tablet Take 500 mg by mouth daily.       . cholecalciferol (VITAMIN D) 1000 UNITS tablet Take 2,000 Units by mouth 2 (two) times daily.       [name] donepezil (ARICEPT) 5 MG tablet Take 5 mg by mouth at bedtime.      [name] esomeprazole (NEXIUM) 40 MG capsule Take 40 mg by mouth daily.       . furosemide (LASIX) 40 MG tablet Take 1 tablet (40 mg total) by mouth daily.  90 tablet  3  . losartan (COZAAR) 50 MG tablet Take 50 mg by mouth daily.        . meloxicam (MOBIC) 15 MG tablet as needed.       . Multiple Vitamins-Minerals (EYE VITAMINS) CAPS Take 1 capsule by mouth daily.       . nadolol (CORGARD) 80 MG tablet Take 80  mg by mouth daily.        [name] NAMENDA XR 28 MG CP24 Take 1 capsule by mouth at bedtime.      [name] NITROSTAT 0.4 MG SL tablet Use as directed as needed      . pravastatin (PRAVACHOL) 80 MG tablet Take 1 tablet by mouth Daily.      . sertraline (ZOLOFT) 25 MG tablet Take 25 mg by mouth daily.      . traMADol (ULTRAM) 50 MG tablet Take 50 mg by mouth daily.         No current facility-administered medications for this visit.   OBJECTIVE: Filed Vitals:   11/03/13 1540  BP: 177/55  Pulse: 53  Temp: 98 F (36.7 C)  Resp: 14   Body mass index is 29.07 kg/(m^2). ECOG FS:0 - Asymptomatic Ocular: Sclerae unicteric, pupils equal, round and reactive to light Ear-nose-throat: Oropharynx clear, dentition fair Lymphatic: No cervical or supraclavicular adenopathy Lungs no rales or rhonchi, good excursion bilaterally Heart regular rate and rhythm, no murmur appreciated Abd soft, nontender, positive bowel sounds MSK no focal spinal tenderness, no joint edema Neuro: non-focal, well-oriented, appropriate affect Breasts: Deferred  LAB RESULTS: CMP     Component Value Date/Time   NA 136 09/28/2013 1203   K 4.1 09/28/2013 1203   CL 93* 09/28/2013 1203   CO2 32 09/28/2013 1203   GLUCOSE 142* 09/28/2013 1203   BUN 15 09/28/2013 1203   CREATININE 0.8 09/28/2013 1203   CALCIUM 9.1 09/28/2013 1203   PROT 7.0 06/01/2009 1508   ALBUMIN 4.5 06/01/2009 1508   AST 19 06/01/2009 1508   ALT 13 06/01/2009 1508   ALKPHOS 76 06/01/2009 1508   BILITOT 0.9 06/01/2009 1508   No results found for this basename: SPEP, UPEP,  kappa and lambda light chains   Lab Results  Component Value Date   WBC 7.8 11/03/2013   NEUTROABS 5.2 11/03/2013   HGB 12.1 11/03/2013   HCT 37.2 11/03/2013   MCV 96 11/03/2013   PLT 252 11/03/2013   No results found for this basename: LABCA2   No components found with this basename: VWUJW119   No results found for this basename: INR,  in the last 168 hours  STUDIES: No results  found.  ASSESSMENT/PLAN: [name] is a 5-year-old white female with multi-factorial anemia. She is doing well. Her blood counts are looking good.  She does not need Aranesp today.  We will wait and see what her iron studies show.  We will see her back in 4 months for labs and follow-up.  She knows  to call here with any questions or concerns and to go to the ED in the event of an emergency. We can certainly see her back sooner if need be.   Verdie Mosher, NP 11/03/2013 5:13 PM

## 2013-11-04 LAB — FERRITIN CHCC

## 2013-11-04 LAB — IRON AND TIBC CHCC
%SAT: 40 % (ref 21–57)
Iron: 106 ug/dL (ref 41–142)
TIBC: 264 ug/dL (ref 236–444)
UIBC: 158 ug/dL (ref 120–384)

## 2013-11-05 ENCOUNTER — Telehealth: Payer: Self-pay | Admitting: *Deleted

## 2013-11-05 NOTE — Telephone Encounter (Signed)
Message copied by Anselm Jungling on Fri Nov 05, 2013  3:33 PM ------      Message from: Arlan Organ R      Created: Thu Nov 04, 2013  5:51 PM       Call her dgtr -- iron is ok!! Cindee Lame ------

## 2013-12-27 ENCOUNTER — Ambulatory Visit (INDEPENDENT_AMBULATORY_CARE_PROVIDER_SITE_OTHER): Payer: Medicare Other | Admitting: Internal Medicine

## 2013-12-27 ENCOUNTER — Encounter: Payer: Self-pay | Admitting: Internal Medicine

## 2013-12-27 ENCOUNTER — Ambulatory Visit (INDEPENDENT_AMBULATORY_CARE_PROVIDER_SITE_OTHER): Payer: Medicare Other | Admitting: *Deleted

## 2013-12-27 VITALS — BP 112/68 | HR 73 | Ht 61.0 in | Wt 162.4 lb

## 2013-12-27 DIAGNOSIS — I2510 Atherosclerotic heart disease of native coronary artery without angina pectoris: Secondary | ICD-10-CM

## 2013-12-27 DIAGNOSIS — Z23 Encounter for immunization: Secondary | ICD-10-CM

## 2013-12-27 DIAGNOSIS — D509 Iron deficiency anemia, unspecified: Secondary | ICD-10-CM

## 2013-12-27 DIAGNOSIS — J449 Chronic obstructive pulmonary disease, unspecified: Secondary | ICD-10-CM

## 2013-12-27 NOTE — Patient Instructions (Signed)
We can continue O2 2L/Advanced  Flu vax  We can help you return in a month for a Prevnar 13 pneumonia vaccine

## 2013-12-27 NOTE — Progress Notes (Signed)
Patient ID: Ellen [name], female    DOB: [birth year], 80 y.o.   MRN: [id number]  HPI 07/26/10- 80 yo never smoker, followed for COPD, allergic rhinitis, complicated by CAD, renal insufficiency, anemia  and GERD Last here February 26, 2010- did have a bronchitis and an episode of gastroenteritis with dehydration, but those have resolved. Felt that pollen bothered her this Spring. Yesterday was more short of breath. Had gone to see Dr Smith/cardiology- she hurried to get there and lost her breath for awhile. He told her she was retaining some fluid. Hgb recently 10- given injection by Dr Myna HidalgoEnnever. Wears oxygen for sleep at 2 L/M. We reviewed her 6MWT done last year. She had portable O2 in the past but couldn't see well enough for it to be worthwhile and turned it in.  PFT 03/05/10- moderate obstruction FEV1/ FVC 0.65, insignif resp to BD. Unable to perform LV or DLCO.  04/23/11- 80 yo never smoker, followed for COPD, allergic rhinitis, complicated by CAD, renal insufficiency, anemia  and GERD Daughter is here. Not breathing well. [facility] provided light portable pulse oxygen. Some mild cough with occasional scant, thick, white sputum. Had chest pain last week and evaluated by Dr. Smith/cardiology and she understands he felt she was okay. Noticing some confusion, loss of memory. Her primary physician asked her to use oxygen more consistently. Her hematologist is watching chronic anemia. Can't afford Spiriva.  08/23/11- 80 yo never smoker, followed for COPD, allergic rhinitis, complicated by CAD, renal insufficiency, anemia  and GERD Not breathing well today-been more active; having increased SOB Increased shortness of breath for several weeks. No distinct infection but she can't rule out a mild cold at the beginning. Loose cough and hacking. She took some Lasix recently put her feet started swelling.  12/23/11-84 yo never smoker, followed for COPD, allergic rhinitis, complicated by CAD, renal  insufficiency, anemia  and GERD FOLLOWS FOR:SOB with rushed activity or increased anxiety/stress     friend here COPD assessment test (CAT) score 34/40 Cough is productive of clear thick sputum. She continues oxygen at 2 L/[facility]. Sleeps upright in chair but planning to get a hospital bed CXR 08/30/11-reviewed IMPRESSION:  Stable exam. Chronic elevation of the left hemidiaphragm with left  basilar scarring and / or atelectasis. Chronic bronchitic changes.  Original Report Authenticated By: Andreas NewportGEOFFREY LAMKE, M.D.   06/23/12- -80 yo never smoker, followed for COPD, allergic rhinitis, complicated by CAD, renal insufficiency, anemia  and GERD  Daughter here FOLLOWS FOR: increased SOB and wheezing more than usual; daughter states patient had bronchitis couple months ago and had hard time getting over it; Pt states she has noticed more weakness and dizziness. Weak and dizzy for 3 days when she ran out of blood pressure pills. Bronchitis in March was treated by her primary physician. Continues oxygen 2 L/[facility]  12/22/12- 80 yo never smoker, followed for COPD, allergic rhinitis, complicated by CAD, renal insufficiency, anemia  and GERD  Daughter here FOLLOWS FOR: recently felt like she had a "cold" x 1 week; PND, and unable to breath. Residual stuffy nose. CXR 06/23/12 IMPRESSION:  No edema or consolidation. Stable elevation of the left  hemidiaphragm. Heart mildly enlarged but stable.   Original Report Authenticated By: Bretta BangWilliam Woodruff, M.D.  06/22/13-80 yo never smoker, followed for COPD, allergic rhinitis, elevated left diaphragm, complicated by CAD, renal insufficiency, anemia  and GERD  Daughter here FOLLOWS FOR: Increased SOB with activity-resides in rest home now. Desat to 88% on arrival  on room walking, 92% at rest. No acute events since last here. Occasional weak spells, cough, chest pains and dizziness but no wheeze.  12/27/13- 80 yo never smoker, followed for COPD/ chronic bronchitis with  mucous plugs, allergic rhinitis, elevated left diaphragm, complicated by CAD, renal insufficiency, anemia and GERD  Daughter here   O2 2l/ [facility] SOB "comes and goes."  Coughing occas - mostly nonprod.  Relates dyspnea to weather changes. CXR 09/06/13 IMPRESSION: 1. Radiographic appearance of the chest is unchanged, with persistent multifocal reticulonodular opacities throughout the lungs bilaterally, compatible with areas of mucoid impaction within dilated bronchi and bronchioles noted on recent chest CT. Electronically Signed  By: Trudie Reedaniel Entrikin M.D.  On: 09/06/2013 16:01  ROS-see HPI Constitutional:   No-   weight loss, night sweats, fevers, chills, fatigue, lassitude, + weakness HEENT:   No-  headaches, difficulty swallowing, tooth/dental problems, sore throat,       No-  sneezing, itching, ear ache, no-nasal congestion, post nasal drip,  CV:   No-chest pain, orthopnea, PND, swelling in lower extremities, +dizziness, no-palpitations Resp: +  shortness of breath with exertion or at rest.              No- productive cough,  + non-productive cough,  No- coughing up of blood.              No-   change in color of mucus.  No- wheezing.   Skin: No-   rash or lesions. GI:  No-   heartburn, indigestion, abdominal pain, nausea, vomiting GU:  MS:  No-   joint pain or swelling.   Neuro-     nothing unusual Psych:  No- change in mood or affect. No depression or anxiety.  No memory loss.  OBJ- Physical Exam  General- Alert, Oriented, Affect-appropriate, Distress- none acute, O2 sat 96% on 2 L pulsed. Skin- rash-none, lesions- none, excoriation- none Lymphadenopathy- none Head- atraumatic            Eyes- +indicates vision diminished, PERRLA, conjunctivae and secretions clear            Ears- Hearing, canals-normal            Nose- Clear, no-Septal dev, mucus, polyps, erosion, perforation             Throat- Mallampati II , mucosa clear , drainage- none, tonsils- atrophic Neck-  flexible , trachea midline, no stridor , thyroid nl, carotid no bruit Chest - symmetrical excursion , unlabored           Heart/CV- RRR/ occ skip , no murmur , no gallop  , no rub, nl s1 s2                           - JVD-none , edema- none, stasis changes- none, varices- none           Lung- clear, wheeze- none, cough- none , dullness+left base, rub- none           Chest wall-  Abd-  Br/ Gen/ Rectal- Not done, not indicated Extrem- cyanosis- none, clubbing, none, atrophy- none, strength- nl. Neuro- grossly intact to observation, calm

## 2013-12-28 ENCOUNTER — Ambulatory Visit: Payer: Medicare Other | Admitting: Internal Medicine

## 2014-01-16 NOTE — Assessment & Plan Note (Signed)
Discussed status and again explain relationship between anemia, oxygen transport and dyspnea

## 2014-01-16 NOTE — Assessment & Plan Note (Signed)
X-rays demonstrate mucous plugging consistent with a chronic bronchitis but she is not bothered much by cough. Plan flu vaccine today, return in at least 1 month for Prevnar 13 as discussed, continue oxygen

## 2014-01-26 ENCOUNTER — Ambulatory Visit (INDEPENDENT_AMBULATORY_CARE_PROVIDER_SITE_OTHER): Payer: Medicare Other

## 2014-01-26 DIAGNOSIS — Z23 Encounter for immunization: Secondary | ICD-10-CM

## 2014-03-07 ENCOUNTER — Ambulatory Visit (HOSPITAL_BASED_OUTPATIENT_CLINIC_OR_DEPARTMENT_OTHER): Payer: Medicare HMO | Admitting: Family

## 2014-03-07 ENCOUNTER — Other Ambulatory Visit (HOSPITAL_BASED_OUTPATIENT_CLINIC_OR_DEPARTMENT_OTHER): Payer: Medicare HMO | Admitting: Lab

## 2014-03-07 ENCOUNTER — Encounter: Payer: Self-pay | Admitting: Family

## 2014-03-07 DIAGNOSIS — N289 Disorder of kidney and ureter, unspecified: Secondary | ICD-10-CM

## 2014-03-07 DIAGNOSIS — D509 Iron deficiency anemia, unspecified: Secondary | ICD-10-CM

## 2014-03-07 DIAGNOSIS — D649 Anemia, unspecified: Secondary | ICD-10-CM

## 2014-03-07 LAB — COMPREHENSIVE METABOLIC PANEL
ALBUMIN: 4.2 g/dL (ref 3.5–5.2)
ALT: 44 U/L — ABNORMAL HIGH (ref 0–35)
AST: 35 U/L (ref 0–37)
Alkaline Phosphatase: 95 U/L (ref 39–117)
BUN: 15 mg/dL (ref 6–23)
CALCIUM: 9.5 mg/dL (ref 8.4–10.5)
CO2: 35 mEq/L — ABNORMAL HIGH (ref 19–32)
Chloride: 91 mEq/L — ABNORMAL LOW (ref 96–112)
Creatinine, Ser: 0.82 mg/dL (ref 0.50–1.10)
Glucose, Bld: 169 mg/dL — ABNORMAL HIGH (ref 70–99)
Potassium: 3.6 mEq/L (ref 3.5–5.3)
Sodium: 135 mEq/L (ref 135–145)
Total Bilirubin: 1 mg/dL (ref 0.2–1.2)
Total Protein: 7 g/dL (ref 6.0–8.3)

## 2014-03-07 LAB — CBC WITH DIFFERENTIAL (CANCER CENTER ONLY)
BASO#: 0 10*3/uL (ref 0.0–0.2)
BASO%: 0.4 % (ref 0.0–2.0)
EOS ABS: 0.2 10*3/uL (ref 0.0–0.5)
EOS%: 2.2 % (ref 0.0–7.0)
HEMATOCRIT: 39.4 % (ref 34.8–46.6)
HGB: 12.5 g/dL (ref 11.6–15.9)
LYMPH#: 1.5 10*3/uL (ref 0.9–3.3)
LYMPH%: 19.2 % (ref 14.0–48.0)
MCH: 29.3 pg (ref 26.0–34.0)
MCHC: 31.7 g/dL — ABNORMAL LOW (ref 32.0–36.0)
MCV: 93 fL (ref 81–101)
MONO#: 0.6 10*3/uL (ref 0.1–0.9)
MONO%: 7.4 % (ref 0.0–13.0)
NEUT#: 5.6 10*3/uL (ref 1.5–6.5)
NEUT%: 70.8 % (ref 39.6–80.0)
Platelets: 272 10*3/uL (ref 145–400)
RBC: 4.26 10*6/uL (ref 3.70–5.32)
RDW: 12.9 % (ref 11.1–15.7)
WBC: 7.8 10*3/uL (ref 3.9–10.0)

## 2014-03-07 NOTE — Progress Notes (Signed)
[facility]  Telephone:([phone] Fax:(336) [phone]  ID: Jefm PettyBarbara Garcia OB: [birth year] MR#: [id number] ZHY#:[id number]#:[id number] Patient Care Team: Orland PenmanIbtehal Jaralla Shamleffer, MD as PCP - General (Internal Medicine)  DIAGNOSIS: 1. Anemia of renal insufficiency.  2. Intermittent iron-deficiency anemia.  3. Chronic congestive heart failure.  INTERVAL HISTORY: [name] is here today with her daughter for a follow-up. She is doing quite well and has had no problems since we last saw her.  She denies fever, chills, n/v, cough, rash, headache, chest pain, palpitations, abdominal pain, constipation, diarrhea, blood in urine or stool. She has some SOB at times with exertion. No new aches or pains. She has had no swelling, tenderness, numbness or tingling in her extremities.  Her appetite is good. Her daughter feels that she could be drinking more fluids. Her weight is down to 8 lbs since September.  Her dementia seems to be stable at this time. She is enjoying being at the assisted living facility. She also had a very nice Christmas and New Years with her family.   CURRENT TREATMENT: 1. Aranesp 300 mcg as needed for hemoglobin less than 11.  2. IV iron as indicated.  REVIEW OF SYSTEMS: All other 10 point review of systems is negative.   PAST MEDICAL HISTORY: Past Medical History  Diagnosis Date  . Chronic airway obstruction, not elsewhere classified   . Allergic rhinitis, cause unspecified   . Esophageal reflux   . Iron deficiency anemia, unspecified   . Coronary atherosclerosis of unspecified type of vessel, native or graft   . Dysfunction of eustachian tube   . Obesity, unspecified   . Macular degeneration, right eye Being treated.  . Macular degeneration, left eye Blind     PAST SURGICAL HISTORY: Past Surgical History  Procedure Laterality Date  . Coronary artery bypass graft      FAMILY HISTORY Family History  Problem Relation Age of Onset  . Colon cancer Brother   .  Breast cancer Sister   . Heart disease Father    GYNECOLOGIC HISTORY:  No LMP recorded. Patient is postmenopausal.   SOCIAL HISTORY:  History   Social History  . Marital Status: Widowed    Spouse Name: N/A    Number of Children: N/A  . Years of Education: N/A   Occupational History  . Retired    Social History Main Topics  . Smoking status: Never Smoker   . Smokeless tobacco: Never Used     Comment: Never used tobacco  . Alcohol Use: Not on file  . Drug Use: Not on file  . Sexual Activity: Not on file   Other Topics Concern  . Not on file   Social History Narrative   ADVANCED DIRECTIVES: <no information>  HEALTH MAINTENANCE: History  Substance Use Topics  . Smoking status: Never Smoker   . Smokeless tobacco: Never Used     Comment: Never used tobacco  . Alcohol Use: Not on file   Colonoscopy: PAP: Bone density: Lipid panel:  Allergies  Allergen Reactions  . Doxycycline   . Clarithromycin     H/A  . Propulsid [Cisapride]   . Prinivil [Lisinopril]     Current Outpatient Prescriptions  Medication Sig Dispense Refill  . ALPRAZolam (XANAX) 0.25 MG tablet Take 0.25 mg by mouth at bedtime as needed.      [name]. aspirin 81 MG tablet Take 81 mg by mouth daily.      . calcium gluconate 500 MG tablet Take 500 mg by mouth daily.     .[name]  cholecalciferol (VITAMIN D) 1000 UNITS tablet Take 2,000 Units by mouth 2 (two) times daily.     [name] donepezil (ARICEPT) 5 MG tablet Take 5 mg by mouth at bedtime.    [name] esomeprazole (NEXIUM) 40 MG capsule Take 40 mg by mouth daily.     . furosemide (LASIX) 40 MG tablet Take 1 tablet (40 mg total) by mouth daily. 90 tablet 3  . losartan (COZAAR) 50 MG tablet Take 50 mg by mouth daily.      . meloxicam (MOBIC) 15 MG tablet as needed.     . Multiple Vitamins-Minerals (EYE VITAMINS) CAPS Take 1 capsule by mouth daily.     . nadolol (CORGARD) 80 MG tablet Take 80 mg by mouth daily.      [name] NAMENDA XR 28 MG CP24 Take 1 capsule by mouth at  bedtime.    [name] NITROSTAT 0.4 MG SL tablet Use as directed as needed    . pravastatin (PRAVACHOL) 80 MG tablet Take 1 tablet by mouth Daily.    . sertraline (ZOLOFT) 25 MG tablet Take 25 mg by mouth daily.    . traMADol (ULTRAM) 50 MG tablet Take 50 mg by mouth daily.       No current facility-administered medications for this visit.   OBJECTIVE: Filed Vitals:   03/07/14 1544  BP: 142/63  Pulse: 81  Temp: 98.5 F (36.9 C)  Resp: 16   Body mass index is 29.11 kg/(m^2). ECOG FS:0 - Asymptomatic Ocular: Sclerae unicteric, pupils equal, round and reactive to light Ear-nose-throat: Oropharynx clear, dentition fair Lymphatic: No cervical or supraclavicular adenopathy Lungs no rales or rhonchi, good excursion bilaterally Heart regular rate and rhythm, no murmur appreciated Abd soft, nontender, positive bowel sounds MSK no focal spinal tenderness, no joint edema Neuro: non-focal, well-oriented, appropriate affect Breasts: Deferred  LAB RESULTS: CMP     Component Value Date/Time   NA 136 09/28/2013 1203   K 4.1 09/28/2013 1203   CL 93* 09/28/2013 1203   CO2 32 09/28/2013 1203   GLUCOSE 142* 09/28/2013 1203   BUN 15 09/28/2013 1203   CREATININE 0.8 09/28/2013 1203   CALCIUM 9.1 09/28/2013 1203   PROT 7.0 06/01/2009 1508   ALBUMIN 4.5 06/01/2009 1508   AST 19 06/01/2009 1508   ALT 13 06/01/2009 1508   ALKPHOS 76 06/01/2009 1508   BILITOT 0.9 06/01/2009 1508   No results found for: SPEP Lab Results  Component Value Date   WBC 7.8 03/07/2014   NEUTROABS 5.6 03/07/2014   HGB 12.5 03/07/2014   HCT 39.4 03/07/2014   MCV 93 03/07/2014   PLT 272 03/07/2014   No results found for: LABCA2 No components found for: HQION629 No results for input(s): INR in the last 168 hours.  STUDIES: No results found.  ASSESSMENT/PLAN: Ms. Buehrle is a 33-year-old white female with multi-factorial anemia. She is doing quite well and is asymptomatic at this time. She last had Fereheme in May.   Her Hgb is 12.5 MCV 93. We will see what her iron studies show but I do not think she will need an infusion at this time.  She does not need Aranesp today.  We will see her back in 4 months for labs and follow-up.  She knows to call here with any questions or concerns and to go to the ED in the event of an emergency. We can certainly see her back sooner if need be.   Verdie Mosher, NP 03/07/2014 3:45 PM

## 2014-03-08 LAB — FERRITIN CHCC: Ferritin: 2050 ng/ml — ABNORMAL HIGH (ref 9–269)

## 2014-03-08 LAB — IRON AND TIBC CHCC
%SAT: 20 % — ABNORMAL LOW (ref 21–57)
Iron: 59 ug/dL (ref 41–142)
TIBC: 301 ug/dL (ref 236–444)
UIBC: 242 ug/dL (ref 120–384)

## 2014-05-01 ENCOUNTER — Emergency Department (HOSPITAL_COMMUNITY)
Admission: EM | Admit: 2014-05-01 | Discharge: 2014-05-02 | Disposition: A | Discharge status: Home / Self Care | Payer: Medicare HMO | Attending: Emergency Medicine | Admitting: Emergency Medicine

## 2014-05-01 ENCOUNTER — Encounter (HOSPITAL_COMMUNITY): Payer: Self-pay | Admitting: Emergency Medicine

## 2014-05-01 ENCOUNTER — Emergency Department (HOSPITAL_COMMUNITY): Payer: Medicare HMO

## 2014-05-01 DIAGNOSIS — F0390 Unspecified dementia without behavioral disturbance: Secondary | ICD-10-CM | POA: Diagnosis not present

## 2014-05-01 DIAGNOSIS — I4891 Unspecified atrial fibrillation: Secondary | ICD-10-CM | POA: Diagnosis not present

## 2014-05-01 DIAGNOSIS — I2510 Atherosclerotic heart disease of native coronary artery without angina pectoris: Secondary | ICD-10-CM | POA: Diagnosis not present

## 2014-05-01 DIAGNOSIS — Z79899 Other long term (current) drug therapy: Secondary | ICD-10-CM | POA: Diagnosis not present

## 2014-05-01 DIAGNOSIS — K219 Gastro-esophageal reflux disease without esophagitis: Secondary | ICD-10-CM | POA: Diagnosis not present

## 2014-05-01 DIAGNOSIS — Z7982 Long term (current) use of aspirin: Secondary | ICD-10-CM | POA: Insufficient documentation

## 2014-05-01 DIAGNOSIS — R410 Disorientation, unspecified: Secondary | ICD-10-CM

## 2014-05-01 DIAGNOSIS — E669 Obesity, unspecified: Secondary | ICD-10-CM | POA: Diagnosis not present

## 2014-05-01 DIAGNOSIS — J449 Chronic obstructive pulmonary disease, unspecified: Secondary | ICD-10-CM | POA: Diagnosis not present

## 2014-05-01 DIAGNOSIS — R4182 Altered mental status, unspecified: Secondary | ICD-10-CM | POA: Diagnosis present

## 2014-05-01 DIAGNOSIS — Z862 Personal history of diseases of the blood and blood-forming organs and certain disorders involving the immune mechanism: Secondary | ICD-10-CM | POA: Insufficient documentation

## 2014-05-01 DIAGNOSIS — Z8669 Personal history of other diseases of the nervous system and sense organs: Secondary | ICD-10-CM | POA: Insufficient documentation

## 2014-05-01 LAB — CBC
HEMATOCRIT: 37 % (ref 36.0–46.0)
Hemoglobin: 11.5 g/dL — ABNORMAL LOW (ref 12.0–15.0)
MCH: 28.5 pg (ref 26.0–34.0)
MCHC: 31.1 g/dL (ref 30.0–36.0)
MCV: 91.6 fL (ref 78.0–100.0)
PLATELETS: 263 10*3/uL (ref 150–400)
RBC: 4.04 MIL/uL (ref 3.87–5.11)
RDW: 13.8 % (ref 11.5–15.5)
WBC: 9.1 10*3/uL (ref 4.0–10.5)

## 2014-05-01 LAB — URINALYSIS, ROUTINE W REFLEX MICROSCOPIC
Bilirubin Urine: NEGATIVE
Glucose, UA: NEGATIVE mg/dL
Hgb urine dipstick: NEGATIVE
KETONES UR: NEGATIVE mg/dL
LEUKOCYTES UA: NEGATIVE
Nitrite: NEGATIVE
Protein, ur: NEGATIVE mg/dL
Specific Gravity, Urine: 1.02 (ref 1.005–1.030)
UROBILINOGEN UA: 0.2 mg/dL (ref 0.0–1.0)
pH: 5.5 (ref 5.0–8.0)

## 2014-05-01 LAB — COMPREHENSIVE METABOLIC PANEL
ALT: 16 U/L (ref 0–35)
AST: 27 U/L (ref 0–37)
Albumin: 3.7 g/dL (ref 3.5–5.2)
Alkaline Phosphatase: 94 U/L (ref 39–117)
Anion gap: 11 (ref 5–15)
BUN: 26 mg/dL — ABNORMAL HIGH (ref 6–23)
CHLORIDE: 93 mmol/L — AB (ref 96–112)
CO2: 35 mmol/L — AB (ref 19–32)
Calcium: 8.8 mg/dL (ref 8.4–10.5)
Creatinine, Ser: 1.04 mg/dL (ref 0.50–1.10)
GFR calc Af Amer: 55 mL/min — ABNORMAL LOW (ref >90)
GFR calc non Af Amer: 47 mL/min — ABNORMAL LOW (ref >90)
Glucose, Bld: 161 mg/dL — ABNORMAL HIGH (ref 70–99)
POTASSIUM: 2.9 mmol/L — AB (ref 3.5–5.1)
Sodium: 139 mmol/L (ref 135–145)
TOTAL PROTEIN: 6.8 g/dL (ref 6.0–8.3)
Total Bilirubin: 0.6 mg/dL (ref 0.3–1.2)

## 2014-05-01 MED ORDER — SODIUM CHLORIDE 0.9 % IV SOLN
1000.0000 mL | Freq: Once | INTRAVENOUS | Status: AC
  Administered 2014-05-01: 1000 mL via INTRAVENOUS

## 2014-05-01 MED ORDER — SODIUM CHLORIDE 0.9 % IV SOLN
1000.0000 mL | INTRAVENOUS | Status: DC
  Administered 2014-05-02: 1000 mL via INTRAVENOUS

## 2014-05-01 NOTE — ED Notes (Signed)
Requested patient to urinate. 

## 2014-05-01 NOTE — ED Notes (Signed)
Bed: WA06 Expected date:  Expected time:  Means of arrival:  Comments: 89F UTI/confusion

## 2014-05-01 NOTE — ED Notes (Signed)
Patient presents from [facility] for dehydration and confusion. Per EMS patient diagnosed with UTI x1 week ago, one round of antibiotics, A&O x3, Hx of dementia. Family concerned about non compliance with medications.   130/70, 80HR, 192CBG, 92% RA, 99% 2L

## 2014-05-01 NOTE — ED Provider Notes (Signed)
CSN: [id number]     Arrival date & time 05/01/14  2008 History   First MD Initiated Contact with Patient 05/01/14 2138     Chief Complaint  Patient presents with  . Altered Mental Status     (Consider location/radiation/quality/duration/timing/severity/associated sxs/prior Treatment) HPI Cipro for 3 days for UTI. Confusion last Sunday. Usually baseline dementia, but had personality change and elaborate stories. The patient has not had a fever or other objective signs of acute illness. The quality of confusion has been for saying things that could not possibly be true and making up stories. This has been waxing and waning. It was more pronounced last week. It had improved over the course of the week and then today the daughter noticed it again. She reports that right now in the emergency department she is more lucid and at baseline and she has been previously. Past Medical History  Diagnosis Date  . Chronic airway obstruction, not elsewhere classified   . Allergic rhinitis, cause unspecified   . Esophageal reflux   . Iron deficiency anemia, unspecified   . Coronary atherosclerosis of unspecified type of vessel, native or graft   . Dysfunction of eustachian tube   . Obesity, unspecified   . Macular degeneration, right eye Being treated.  . Macular degeneration, left eye Blind    Past Surgical History  Procedure Laterality Date  . Coronary artery bypass graft     Family History  Problem Relation Age of Onset  . Colon cancer Brother   . Breast cancer Sister   . Heart disease Father    History  Substance Use Topics  . Smoking status: Never Smoker   . Smokeless tobacco: Never Used     Comment: Never used tobacco  . Alcohol Use: Not on file   OB History    No data available     Review of Systems  10 Systems reviewed and are negative for acute change except as noted in the HPI. Review of systems as per family members in conjunction with the patient.  Allergies  Doxycycline;  Clarithromycin; Propulsid; and Prinivil  Home Medications   Prior to Admission medications   Medication Sig Start Date End Date Taking? Authorizing Provider  ALPRAZolam (XANAX) 0.25 MG tablet Take 0.25 mg by mouth at bedtime.    Yes Historical Provider, MD  ALPRAZolam (XANAX) 0.25 MG tablet Take 0.25 mg by mouth 3 (three) times daily as needed for anxiety.   Yes Historical Provider, MD  aspirin 81 MG tablet Take 81 mg by mouth at bedtime.    Yes Historical Provider, MD  Calcium-Vitamin D-Vitamin K (CALCIUM SOFT CHEWS PO) Take 2 tablets by mouth daily.   Yes Historical Provider, MD  cholecalciferol (VITAMIN D) 1000 UNITS tablet Take 2,000 Units by mouth daily.    Yes Historical Provider, MD  donepezil (ARICEPT) 5 MG tablet Take 5 mg by mouth at bedtime.   Yes Historical Provider, MD  esomeprazole (NEXIUM) 40 MG capsule Take 40 mg by mouth daily.    Yes Historical Provider, MD  furosemide (LASIX) 40 MG tablet Take 1 tablet (40 mg total) by mouth daily. 09/20/13  Yes Lyn Records, MD  loratadine (CLARITIN) 10 MG tablet Take 10 mg by mouth at bedtime.   Yes Historical Provider, MD  losartan (COZAAR) 50 MG tablet Take 50 mg by mouth daily.     Yes Historical Provider, MD  Multiple Vitamins-Minerals (EYE VITAMINS) CAPS Take 1 capsule by mouth daily.    Yes Historical Provider,  MD  nadolol (CORGARD) 80 MG tablet Take 80 mg by mouth at bedtime.    Yes Historical Provider, MD  NAMENDA XR 28 MG CP24 Take 1 capsule by mouth at bedtime. 05/18/12  Yes Historical Provider, MD  NITROSTAT 0.4 MG SL tablet Place 0.4 mg under the tongue every 5 (five) minutes as needed for chest pain. Use as directed as needed 11/29/12   Historical Provider, MD  pravastatin (PRAVACHOL) 80 MG tablet Take 1 tablet by mouth at bedtime.  07/10/10  Yes Historical Provider, MD  sertraline (ZOLOFT) 25 MG tablet Take 25 mg by mouth at bedtime.    Yes Historical Provider, MD  traMADol (ULTRAM) 50 MG tablet Take 50 mg by mouth daily.     Yes  Historical Provider, MD   BP 119/64 mmHg  Pulse 87  Resp 20  SpO2 100% Physical Exam  Constitutional: She appears well-developed and well-nourished.  The patient is alert and interactive. She does show some signs of dementia with deferring to her family members for additional history. She shows no signs of respiratory distress.  HENT:  Head: Normocephalic and atraumatic.  Eyes: EOM are normal. Pupils are equal, round, and reactive to light.  Neck: Neck supple.  Cardiovascular: Normal rate, regular rhythm, normal heart sounds and intact distal pulses.   Pulmonary/Chest: Effort normal and breath sounds normal.  Abdominal: Soft. Bowel sounds are normal. She exhibits no distension. There is no tenderness.  Musculoskeletal: Normal range of motion. She exhibits no edema or tenderness.  Neurological: She is alert. She has normal strength. She exhibits normal muscle tone. Coordination normal. GCS eye subscore is 4. GCS verbal subscore is 5. GCS motor subscore is 6.  Skin: Skin is warm, dry and intact.  Psychiatric: She has a normal mood and affect.    ED Course  Procedures (including critical care time) Labs Review Labs Reviewed  COMPREHENSIVE METABOLIC PANEL - Abnormal; Notable for the following:    Potassium 2.9 (*)    Chloride 93 (*)    CO2 35 (*)    Glucose, Bld 161 (*)    BUN 26 (*)    GFR calc non Af Amer 47 (*)    GFR calc Af Amer 55 (*)    All other components within normal limits  CBC - Abnormal; Notable for the following:    Hemoglobin 11.5 (*)    All other components within normal limits  URINALYSIS, ROUTINE W REFLEX MICROSCOPIC    Imaging Review No results found.   EKG Interpretation   Date/Time:  Sunday May 01 2014 21:14:51 EDT Ventricular Rate:  83 PR Interval:    QRS Duration: 81 QT Interval:  373 QTC Calculation: 438 R Axis:   46 Text Interpretation:  Atrial fibrillation Ventricular premature complex  Borderline low voltage, extremity leads Nonspecific  T abnormalities,  lateral leads agree Confirmed by Donnald Garre, MD, Lebron Conners [id number]) on 05/01/2014  11:14:38 PM     Consult Cardiology: Dr. Mayford Knife will arrange f/u tomorrow to evaluate rate controlled a fib. MDM   Final diagnoses:  Confusion  Dementia, without behavioral disturbance  Atrial fibrillation, unspecified   The patient has known dementia. Her general appearance today in the emergency department as well. There are not findings of acute infectious illness at this point time. There is not significant metabolic derangement. Of note the patient appears to have newer onset of H of fibrillation. This is rate controlled. The symptoms as discussed with family members do not sound like a stroke or TIA  type symptoms. The case was reviewed with Dr. Mayford Knifeurner and the patient will have follow-up for ongoing A. fib treatment. She is on a daily aspirin.    Arby BarretteMarcy Carrina Schoenberger, MD 05/05/14 [id number]

## 2014-05-02 ENCOUNTER — Telehealth: Payer: Self-pay | Admitting: Interventional Cardiology

## 2014-05-02 NOTE — Telephone Encounter (Signed)
New Message        Pt's daughter calling stating that pt needs post hosp f/u, Dr. Michaelle CopasSmith's first available is in May and the first available with a PA is in April. Pt's daughter states that the pt can't wait that long to be seen. Please call back and advise.

## 2014-05-02 NOTE — Discharge Instructions (Signed)
Atrial Fibrillation °Atrial fibrillation is a type of irregular heart rhythm (arrhythmia). During atrial fibrillation, the upper chambers of the heart (atria) quiver continuously in a chaotic pattern. This causes an irregular and often rapid heart rate.  °Atrial fibrillation is the result of the heart becoming overloaded with disorganized signals that tell it to beat. These signals are normally released one at a time by a part of the right atrium called the sinoatrial node. They then travel from the atria to the lower chambers of the heart (ventricles), causing the atria and ventricles to contract and pump blood as they pass. In atrial fibrillation, parts of the atria outside of the sinoatrial node also release these signals. This results in two problems. First, the atria receive so many signals that they do not have time to fully contract. Second, the ventricles, which can only receive one signal at a time, beat irregularly and out of rhythm with the atria.  °There are three types of atrial fibrillation:  °· Paroxysmal. Paroxysmal atrial fibrillation starts suddenly and stops on its own within a week. °· Persistent. Persistent atrial fibrillation lasts for more than a week. It may stop on its own or with treatment. °· Permanent. Permanent atrial fibrillation does not go away. Episodes of atrial fibrillation may lead to permanent atrial fibrillation. °Atrial fibrillation can prevent your heart from pumping blood normally. It increases your risk of stroke and can lead to heart failure.  °CAUSES  °· Heart conditions, including a heart attack, heart failure, coronary artery disease, and heart valve conditions.   °· Inflammation of the sac that surrounds the heart (pericarditis). °· Blockage of an artery in the lungs (pulmonary embolism). °· Pneumonia or other infections. °· Chronic lung disease. °· Thyroid problems, especially if the thyroid is overactive (hyperthyroidism). °· Caffeine, excessive alcohol use, and use  of some illegal drugs.   °· Use of some medicines, including certain decongestants and diet pills. °· Heart surgery.   °· Birth defects.   °Sometimes, no cause can be found. When this happens, the atrial fibrillation is called lone atrial fibrillation. The risk of complications from atrial fibrillation increases if you have lone atrial fibrillation and you are age 60 years or older. °RISK FACTORS °· Heart failure. °· Coronary artery disease. °· Diabetes mellitus.   °· High blood pressure (hypertension).   °· Obesity.   °· Other arrhythmias.   °· Increased age. °SIGNS AND SYMPTOMS  °· A feeling that your heart is beating rapidly or irregularly.   °· A feeling of discomfort or pain in your chest.   °· Shortness of breath.   °· Sudden light-headedness or weakness.   °· Getting tired easily when exercising.   °· Urinating more often than normal (mainly when atrial fibrillation first begins).   °In paroxysmal atrial fibrillation, symptoms may start and suddenly stop. °DIAGNOSIS  °Your health care provider may be able to detect atrial fibrillation when taking your pulse. Your health care provider may have you take a test called an ambulatory electrocardiogram (ECG). An ECG records your heartbeat patterns over a 24-hour period. You may also have other tests, such as: °· Transthoracic echocardiogram (TTE). During echocardiography, sound waves are used to evaluate how blood flows through your heart. °· Transesophageal echocardiogram (TEE). °· Stress test. There is more than one type of stress test. If a stress test is needed, ask your health care provider about which type is best for you. °· Chest X-ray exam. °· Blood tests. °· Computed tomography (CT). °TREATMENT  °Treatment may include: °· Treating any underlying conditions. For example, if you   have an overactive thyroid, treating the condition may correct atrial fibrillation.  Taking medicine. Medicines may be given to control a rapid heart rate or to prevent blood  clots, heart failure, or a stroke.  Having a procedure to correct the rhythm of the heart:  Electrical cardioversion. During electrical cardioversion, a controlled, low-energy shock is delivered to the heart through your skin. If you have chest pain, very low blood pressure, or sudden heart failure, this procedure may need to be done as an emergency.  Catheter ablation. During this procedure, heart tissues that send the signals that cause atrial fibrillation are destroyed.  Surgical ablation. During this surgery, thin lines of heart tissue that carry the abnormal signals are destroyed. This procedure can either be an open-heart surgery or a minimally invasive surgery. With the minimally invasive surgery, small cuts are made to access the heart instead of a large opening.  Pulmonary venous isolation. During this surgery, tissue around the veins that carry blood from the lungs (pulmonary veins) is destroyed. This tissue is thought to carry the abnormal signals. HOME CARE INSTRUCTIONS   Take medicines only as directed by your health care provider. Some medicines can make atrial fibrillation worse or recur.  If blood thinners were prescribed by your health care provider, take them exactly as directed. Too much blood-thinning medicine can cause bleeding. If you take too little, you will not have the needed protection against stroke and other problems.  Perform blood tests at home if directed by your health care provider. Perform blood tests exactly as directed.  Quit smoking if you smoke.  Do not drink alcohol.  Do not drink caffeinated beverages such as coffee, soda, and some teas. You may drink decaffeinated coffee, soda, or tea.   Maintain a healthy weight.Do not use diet pills unless your health care provider approves. They may make heart problems worse.   Follow diet instructions as directed by your health care provider.  Exercise regularly as directed by your health care  provider.  Keep all follow-up visits as directed by your health care provider. This is important. PREVENTION  The following substances can cause atrial fibrillation to recur:   Caffeinated beverages.  Alcohol.  Certain medicines, especially those used for breathing problems.  Certain herbs and herbal medicines, such as those containing ephedra or ginseng.  Illegal drugs, such as cocaine and amphetamines. Sometimes medicines are given to prevent atrial fibrillation from recurring. Proper treatment of any underlying condition is also important in helping prevent recurrence.  SEEK MEDICAL CARE IF:  You notice a change in the rate, rhythm, or strength of your heartbeat.  You suddenly begin urinating more frequently.  You tire more easily when exerting yourself or exercising. SEEK IMMEDIATE MEDICAL CARE IF:   You have chest pain, abdominal pain, sweating, or weakness.  You feel nauseous.  You have shortness of breath.  You suddenly have swollen feet and ankles.  You feel dizzy.  Your face or limbs feel numb or weak.  You have a change in your vision or speech. MAKE SURE YOU:   Understand these instructions.  Will watch your condition.  Will get help right away if you are not doing well or get worse. Document Released: 02/04/2005 Document Revised: 06/21/2013 Document Reviewed: 03/17/2012 [facility] Patient Information 2015 [place], [place]. This information is not intended to replace advice given to you by your health care provider. Make sure you discuss any questions you have with your health care provider. Dementia Dementia is a general term  for problems with brain function. A person with dementia has memory loss and a hard time with at least one other brain function such as thinking, speaking, or problem solving. Dementia can affect social functioning, how you do your job, your mood, or your personality. The changes may be hidden for a long time. The earliest forms of this  disease are usually not detected by family or friends. Dementia can be:  Irreversible.  Potentially reversible.  Partially reversible.  Progressive. This means it can get worse over time. CAUSES  Irreversible dementia causes may include:  Degeneration of brain cells (Alzheimer disease or Lewy body dementia).  Multiple small strokes (vascular dementia).  Infection (chronic meningitis or Creutzfeldt-Jakob disease).  Frontotemporal dementia. This affects younger people, age 79 to 4370, compared to those who have Alzheimer disease.  Dementia associated with other disorders like Parkinson disease, Huntington disease, or HIV-associated dementia. Potentially or partially reversible dementia causes may include:  Medicines.  Metabolic causes such as excessive alcohol intake, vitamin B12 deficiency, or thyroid disease.  Masses or pressure in the brain such as a tumor, blood clot, or hydrocephalus. SIGNS AND SYMPTOMS  Symptoms are often hard to detect. Family members or coworkers may not notice them early in the disease process. Different people with dementia may have different symptoms. Symptoms can include:  A hard time with memory, especially recent memory. Long-term memory may not be impaired.  Asking the same question multiple times or forgetting something someone just said.  A hard time speaking your thoughts or finding certain words.  A hard time solving problems or performing familiar tasks (such as how to use a telephone).  Sudden changes in mood.  Changes in personality, especially increasing moodiness or mistrust.  Depression.  A hard time understanding complex ideas that were never a problem in the past. DIAGNOSIS  There are no specific tests for dementia.   Your health care provider may recommend a thorough evaluation. This is because some forms of dementia can be reversible. The evaluation will likely include a physical exam and getting a detailed history from you  and a family member. The history often gives the best clues and suggestions for a diagnosis.  Memory testing may be done. A detailed brain function evaluation called neuropsychologic testing may be helpful.  Lab tests and brain imaging (such as a CT scan or MRI scan) are sometimes important.  Sometimes observation and re-evaluation over time is very helpful. TREATMENT  Treatment depends on the cause.   If the problem is a vitamin deficiency, it may be helped or cured with supplements.  For dementias such as Alzheimer disease, medicines are available to stabilize or slow the course of the disease. There are no cures for this type of dementia.  Your health care provider can help direct you to groups, organizations, and other health care providers to help with decisions in the care of you or your loved one. HOME CARE INSTRUCTIONS The care of individuals with dementia is varied and dependent upon the progression of the dementia. The following suggestions are intended for the person living with, or caring for, the person with dementia.  Create a safe environment.  Remove the locks on bathroom doors to prevent the person from accidentally locking himself or herself in.  Use childproof latches on kitchen cabinets and any place where cleaning supplies, chemicals, or alcohol are kept.  Use childproof covers in unused electrical outlets.  Install childproof devices to keep doors and windows secured.  Remove stove knobs  or install safety knobs and an automatic shut-off on the stove.  Lower the temperature on water heaters.  Label medicines and keep them locked up.  Secure knives, lighters, matches, power tools, and guns, and keep these items out of reach.  Keep the house free from clutter. Remove rugs or anything that might contribute to a fall.  Remove objects that might break and hurt the person.  Make sure lighting is good, both inside and outside.  Install grab rails as  needed.  Use a monitoring device to alert you to falls or other needs for help.  Reduce confusion.  Keep familiar objects and people around.  Use night lights or dim lights at night.  Label items or areas.  Use reminders, notes, or directions for daily activities or tasks.  Keep a simple, consistent routine for waking, meals, bathing, dressing, and bedtime.  Create a calm, quiet environment.  Place large clocks and calendars prominently.  Display emergency numbers and home address near all telephones.  Use cues to establish different times of the day. An example is to open curtains to let the natural light in during the day.   Use effective communication.  Choose simple words and short sentences.  Use a gentle, calm tone of voice.  Be careful not to interrupt.  If the person is struggling to find a word or communicate a thought, try to provide the word or thought.  Ask one question at a time. Allow the person ample time to answer questions. Repeat the question again if the person does not respond.  Reduce nighttime restlessness.  Provide a comfortable bed.  Have a consistent nighttime routine.  Ensure a regular walking or physical activity schedule. Involve the person in daily activities as much as possible.  Limit napping during the day.  Limit caffeine.  Attend social events that stimulate rather than overwhelm the senses.  Encourage good nutrition and hydration.  Reduce distractions during meal times and snacks.  Avoid foods that are too hot or too cold.  Monitor chewing and swallowing ability.  Continue with routine vision, hearing, dental, and medical screenings.  Give medicines only as directed by the health care provider.  Monitor driving abilities. Do not allow the person to drive when safe driving is no longer possible.  Register with an identification program which could provide location assistance in the event of a missing person  situation. SEEK MEDICAL CARE IF:   New behavioral problems start such as moodiness, aggressiveness, or seeing things that are not there (hallucinations).  Any new problem with brain function happens. This includes problems with balance, speech, or falling a lot.  Problems with swallowing develop.  Any symptoms of other illness happen. Small changes or worsening in any aspect of brain function can be a sign that the illness is getting worse. It can also be a sign of another medical illness such as infection. Seeing a health care provider right away is important. SEEK IMMEDIATE MEDICAL CARE IF:   A fever develops.  New or worsened confusion develops.  New or worsened sleepiness develops.  Staying awake becomes hard to do. Document Released: 07/31/2000 Document Revised: 06/21/2013 Document Reviewed: 07/02/2010 [facility] Patient Information 2015 [place], [place]. This information is not intended to replace advice given to you by your health care provider. Make sure you discuss any questions you have with your health care provider.

## 2014-05-03 NOTE — Telephone Encounter (Signed)
spoke with pt daughter offered her an appt with Lilian ComaScott Weaver,PA  on 3/18 @ 8:30am. Pt phone dropped the call x2. pt has dementia and is not able to get going that early.adv het that we try to call our post hosp pt with in 48 hrs to schedule a f/u appt. adv her that the triage nurse has appt slots held for that purpose, and might have better times available. Adv her that a nurse will call her to schedule f/u for the pt.

## 2014-05-03 NOTE — Telephone Encounter (Signed)
2nd attempt. lmtcb. I will work on getting pt a f/u appt and call back with appt info

## 2014-05-03 NOTE — Telephone Encounter (Signed)
attempted to return pt daughter call.unable to lmom line busy

## 2014-05-04 ENCOUNTER — Telehealth: Payer: Self-pay | Admitting: Interventional Cardiology

## 2014-05-04 NOTE — Telephone Encounter (Signed)
lmom on both phone numbers listed for pt daughte pt appt scheduled for 3/17 @ 2:30pm with Sharene SkeansBryan Hager,PA pt daughtyer Eunice BlaseDebbie to call back to comfirm

## 2014-05-04 NOTE — Telephone Encounter (Signed)
New message     Patients daughter called and would like a call back.

## 2014-05-04 NOTE — Telephone Encounter (Signed)
Pt daughter aware and confirmed appt on 3/17 @ 2:30pm [facility]

## 2014-05-05 ENCOUNTER — Encounter: Payer: Self-pay | Admitting: Physician Assistant

## 2014-05-05 ENCOUNTER — Ambulatory Visit (INDEPENDENT_AMBULATORY_CARE_PROVIDER_SITE_OTHER): Payer: Medicare HMO | Admitting: Physician Assistant

## 2014-05-05 VITALS — BP 138/70 | HR 83 | Ht 61.0 in | Wt 148.0 lb

## 2014-05-05 DIAGNOSIS — I10 Essential (primary) hypertension: Secondary | ICD-10-CM

## 2014-05-05 DIAGNOSIS — I2510 Atherosclerotic heart disease of native coronary artery without angina pectoris: Secondary | ICD-10-CM

## 2014-05-05 DIAGNOSIS — R0602 Shortness of breath: Secondary | ICD-10-CM

## 2014-05-05 DIAGNOSIS — I4891 Unspecified atrial fibrillation: Secondary | ICD-10-CM | POA: Insufficient documentation

## 2014-05-05 MED ORDER — POTASSIUM CHLORIDE CRYS ER 20 MEQ PO TBCR
20.0000 meq | EXTENDED_RELEASE_TABLET | Freq: Every day | ORAL | Status: DC
Start: 2014-05-05 — End: 2014-11-07

## 2014-05-05 MED ORDER — FUROSEMIDE 20 MG PO TABS: 20.0000 mg | ORAL_TABLET | Freq: Every day | ORAL | Status: DC

## 2014-05-05 MED ORDER — ASPIRIN EC 325 MG PO TBEC
325.0000 mg | DELAYED_RELEASE_TABLET | Freq: Every day | ORAL | Status: DC
Start: 2014-05-05 — End: 2014-10-03

## 2014-05-05 NOTE — Assessment & Plan Note (Addendum)
Patient's rate is well controlled. It's 83 today and was 83 in the emergency room he other day. Overall she doesn't appear to be symptomatic. She is on nadolol which we'll continue. Potassium when checked in the emergency room was 2.9 and she is not on daily potassium even though she is on daily Lasix. It is not clear if she received any while there. She'll be started on 20 MG daily and will take 40 meq the first day. We'll check a 2-D echocardiogram. CHADSVASC = 5.  She has fallen 3 times in the last 2 weeks this potentially could be related to orthostatic hypotension however, given her age I think would be best not to start her on anticoagulation.  I will increase her aspirin to 325.  If symptoms worsen supposed to consider a TEE cardioversion however, at this point I'm inclined to leave her in the atrial fibrillation  Her daughter reports they are looking at alternate living arrangements. Currently she is in assisted living however, think she needs more round-the-clock care and help with medication Mgt.   Follow-up with Dr. Katrinka BlazingSmith after the echocardiogram

## 2014-05-05 NOTE — Assessment & Plan Note (Signed)
Likely related chronic lung disease however, could be a component related to atrial fibrillation. Today her lungs sound clear.

## 2014-05-05 NOTE — Assessment & Plan Note (Addendum)
Mildly elevated today.  However, due to intermittent dizziness, recent falls and dehydration, I will decrease her Lasix to 20 mg daily.  She is not in a position to self manage her Lasix.  According to her daughter she has been losing weight and not eating or drinking very well.  I am checking a basic metabolic panel today and depending on the results, we'll make adjustments to her medications.

## 2014-05-05 NOTE — Patient Instructions (Signed)
Your physician has recommended you make the following change in your medication:  1) INCREASE Aspirin to 325mg  daily 2) DECREASE Lasix to 20mg  daily 3) START Potassium. Take 40meq today. Then take 20meq daily. An Rx has been sent to your pharmacy  Lab Today: [facility]  Your physician has requested that you have an echocardiogram. Echocardiography is a painless test that uses sound waves to create images of your heart. It provides your doctor with information about the size and shape of your heart and how well your heart's chambers and valves are working. This procedure takes approximately one hour. There are no restrictions for this procedure.  Your physician recommends that you schedule a follow-up appointment in: 1 month with Dr.Smith

## 2014-05-05 NOTE — Progress Notes (Signed)
Patient ID: Ellen PettyBarbara Lambrecht, female   DOB: Apr 19, 1926, 81 y.o.   MRN: [id number]    Date:  05/05/2014   ID:  Ellen PettyBarbara Kottke, DOB Apr 19, 1926, MRN [id number]  PCP:  Clelia SchaumannShamleffer, Ibethal J, MD  Primary Cardiologist:  Katrinka BlazingSmith  Chief complaint: New atrial fibrillation   History of Present Illness: Ellen [name] is a 81 y.o. female with a history of dementia, coronary artery disease, iron deficiency anemia, chronic airway obstruction, macular degeneration.  Patient was recently seen at the emergency room with altered mental status.  She appeared to have new onset atrial fibrillation which was rate controlled. She is here for follow-up.  Patient is here with her daughter she's had unexplained weight loss with appetite change. She is not eating very much. She gets short of breath while lying down and with activity. His had a few episodes of dizziness and has fallen 3 times in the last 2 weeks. Is on clear whether the fall is related to the dizziness however, she has been dehydrated recently and was hypokalemic in the emergency room. She has had some mild lower extremity edema. Her weight today is 148 pounds and on January 18 it was 154.  The patient currently denies nausea, vomiting, fever, chest pain,  PND, cough, congestion, abdominal pain, hematochezia, melena, claudication.  Wt Readings from Last 3 Encounters:  05/05/14 148 lb (67.132 kg)  03/07/14 154 lb (69.854 kg)  12/27/13 162 lb 6.4 oz (73.664 kg)     Past Medical History  Diagnosis Date  . Chronic airway obstruction, not elsewhere classified   . Allergic rhinitis, cause unspecified   . Esophageal reflux   . Iron deficiency anemia, unspecified   . Coronary atherosclerosis of unspecified type of vessel, native or graft   . Dysfunction of eustachian tube   . Obesity, unspecified   . Macular degeneration, right eye Being treated.  . Macular degeneration, left eye Blind     Current Outpatient Prescriptions  Medication Sig Dispense Refill  .  ALPRAZolam (XANAX) 0.25 MG tablet Take 0.25 mg by mouth at bedtime.     . Calcium-Vitamin D-Vitamin K (CALCIUM SOFT CHEWS PO) Take 2 tablets by mouth daily.    . cholecalciferol (VITAMIN D) 1000 UNITS tablet Take 1,000 Units by mouth daily. Take two (2) tablets = 2000 units    . donepezil (ARICEPT) 5 MG tablet Take 5 mg by mouth at bedtime.    [name]. esomeprazole (NEXIUM) 40 MG capsule Take 40 mg by mouth daily.     . furosemide (LASIX) 20 MG tablet Take 1 tablet (20 mg total) by mouth daily.    [name]. losartan (COZAAR) 50 MG tablet Take 50 mg by mouth daily.      . Multiple Vitamins-Minerals (EYE VITAMINS) CAPS Take 1 capsule by mouth daily.     . nadolol (CORGARD) 80 MG tablet Take 80 mg by mouth at bedtime.     [name]. NAMENDA XR 28 MG CP24 Take 1 capsule by mouth at bedtime.    [name]. NITROSTAT 0.4 MG SL tablet Place 0.4 mg under the tongue every 5 (five) minutes as needed for chest pain. Use as directed as needed    . pravastatin (PRAVACHOL) 80 MG tablet Take 1 tablet by mouth at bedtime.     . sertraline (ZOLOFT) 25 MG tablet Take 25 mg by mouth at bedtime.     . traMADol (ULTRAM) 50 MG tablet Take 50 mg by mouth every 6 (six) hours as needed.    [name]. aspirin EC  325 MG tablet Take 1 tablet (325 mg total) by mouth daily.  0  . potassium chloride SA (KLOR-CON M20) 20 MEQ tablet Take 1 tablet (20 mEq total) by mouth daily. 30 tablet 5   No current facility-administered medications for this visit.    Allergies:    Allergies  Allergen Reactions  . Doxycycline Other (See Comments)    Destroyed good bacteria in GI tract  . Clarithromycin Other (See Comments)    H/A .[name]..patient unsure of reaction    . Prinivil [Lisinopril] Other (See Comments)    Patient unsure of reaction  . Propulsid [Cisapride] Other (See Comments)    Patient unsure of reaction    Social History:  The patient  reports that she has never smoked. She has never used smokeless tobacco.   Family history:   Family History  Problem Relation  Age of Onset  . Colon cancer Brother   . Breast cancer Sister   . Heart disease Father   . Hypertension Father   . Heart disease Mother   . Hypertension Mother     ROS:  Please see the history of present illness.  All other systems reviewed and negative.   PHYSICAL EXAM: VS:  BP 138/70 mmHg  Pulse 83  Ht  (1.549 m)  Wt 148 lb (67.132 kg)  BMI 27.98 kg/m2 Well nourished, well developed, in no acute distress HEENT: Pupils are equal round react to light accommodation extraocular movements are intact.  Neck: no JVDNo cervical lymphadenopathy. Cardiac: Regular rate and rhythm without murmurs rubs or gallops. Lungs:  clear to auscultation bilaterally, no wheezing, rhonchi or rales Abd: soft, nontender, positive bowel sounds all quadrants, no hepatosplenomegaly Ext: trace to 1+ LEE lower extremity edema.  2+ radial and dorsalis pedis pulses. Skin: warm and dry Neuro:  Grossly normal  EKG:   Atrial fibrillation with a rate of 83 bpm  ASSESSMENT AND PLAN:  Problem List Items Addressed This Visit    Essential hypertension, benign    Mildly elevated today.  However, due to intermittent dizziness, recent falls and dehydration, I will decrease her Lasix to 20 mg daily.  She is not in a position to self manage her Lasix.  According to her daughter she has been losing weight and not eating or drinking very well.  I am checking a basic metabolic panel today and depending on the results, we'll make adjustments to her medications.      Relevant Medications   furosemide (LASIX) tablet   aspirin EC tablet   DYSPNEA    Likely related chronic lung disease however, could be a component related to atrial fibrillation. Today her lungs sound clear.      Coronary atherosclerosis    Coronary bypass grafting 1999 with LIMA to LAD, SVG to OM, SVG to diagonal, and SVG to PDA patent grafts documented in 2007.  No complaints of angina.      Relevant Medications   furosemide (LASIX) tablet    aspirin EC tablet   Atrial fibrillation - Primary    Patient's rate is well controlled. It's 83 today and was 83 in the emergency room he other day. Overall she doesn't appear to be symptomatic. She is on nadolol which we'll continue. Potassium when checked in the emergency room was 2.9 and she is not on daily potassium even though she is on daily Lasix. It is not clear if she received any while there. She'll be started on 20 MG daily and will take 40 meq  the first day. We'll check a 2-D echocardiogram. CHADSVASC = 5.  She has fallen 3 times in the last 2 weeks this potentially could be related to orthostatic hypotension however, given her age I think would be best not to start her on anticoagulation.  I will increase her aspirin to 325.  If symptoms worsen supposed to consider a TEE cardioversion however, at this point I'm inclined to leave her in the atrial fibrillation  Her daughter reports they are looking at alternate living arrangements. Currently she is in assisted living however, think she needs more round-the-clock care and help with medication Mgt.   Follow-up with Dr. Katrinka Blazing after the echocardiogram      Relevant Medications   furosemide (LASIX) tablet   aspirin EC tablet   Other Relevant Orders   EKG 12-Lead   2D Echocardiogram without contrast   Basic metabolic panel

## 2014-05-05 NOTE — Assessment & Plan Note (Signed)
Coronary bypass grafting 1999 with LIMA to LAD, SVG to OM, SVG to diagonal, and SVG to PDA patent grafts documented in 2007.  No complaints of angina.

## 2014-05-06 ENCOUNTER — Encounter: Payer: Private Health Insurance - Indemnity | Admitting: Physician Assistant

## 2014-05-06 ENCOUNTER — Other Ambulatory Visit: Payer: Self-pay

## 2014-05-06 LAB — BASIC METABOLIC PANEL
BUN: 16 mg/dL (ref 6–23)
CALCIUM: 8.7 mg/dL (ref 8.4–10.5)
CO2: 38 mEq/L — ABNORMAL HIGH (ref 19–32)
CREATININE: 0.86 mg/dL (ref 0.40–1.20)
Chloride: 93 mEq/L — ABNORMAL LOW (ref 96–112)
GFR: 66.48 mL/min (ref >60.00)
GLUCOSE: 132 mg/dL — AB (ref 70–99)
Potassium: 3.4 mEq/L — ABNORMAL LOW (ref 3.5–5.1)
Sodium: 137 mEq/L (ref 135–145)

## 2014-05-06 MED ORDER — FUROSEMIDE 20 MG PO TABS: 20.0000 mg | ORAL_TABLET | Freq: Every day | ORAL | Status: DC

## 2014-05-09 ENCOUNTER — Ambulatory Visit (HOSPITAL_COMMUNITY): Payer: Medicare HMO | Attending: Cardiology | Admitting: Radiology

## 2014-05-09 DIAGNOSIS — I4891 Unspecified atrial fibrillation: Secondary | ICD-10-CM | POA: Diagnosis not present

## 2014-05-09 NOTE — Progress Notes (Signed)
Echocardiogram performed.  

## 2014-06-07 ENCOUNTER — Ambulatory Visit (INDEPENDENT_AMBULATORY_CARE_PROVIDER_SITE_OTHER): Payer: Medicare HMO | Admitting: Interventional Cardiology

## 2014-06-07 ENCOUNTER — Encounter: Payer: Self-pay | Admitting: Interventional Cardiology

## 2014-06-07 VITALS — BP 152/80 | HR 96 | Ht 61.0 in | Wt 147.0 lb

## 2014-06-07 DIAGNOSIS — I482 Chronic atrial fibrillation, unspecified: Secondary | ICD-10-CM

## 2014-06-07 DIAGNOSIS — J449 Chronic obstructive pulmonary disease, unspecified: Secondary | ICD-10-CM | POA: Diagnosis not present

## 2014-06-07 DIAGNOSIS — I10 Essential (primary) hypertension: Secondary | ICD-10-CM

## 2014-06-07 DIAGNOSIS — I2510 Atherosclerotic heart disease of native coronary artery without angina pectoris: Secondary | ICD-10-CM

## 2014-06-07 NOTE — Progress Notes (Signed)
Cardiology Office Note   Date:  06/07/2014   ID:  Ellen [name], DOB Jul 18, 1926, MRN [id number]  PCP:  Clelia Schaumann, MD  Cardiologist:    Lesleigh Noe, MD   Chief Complaint  Patient presents with  . Coronary Artery Disease      History of Present Illness: Ellen [name] is a 81 y.o. female who presents for coronary artery disease with prior CABG, COPD, essential hypertension, and chronic atrial fibrillation.  The patient is accompanied by her daughter. She has had no chest discomfort complaint suggest a cardiac problem. This is routine follow-up for coronary disease. Noted during the office visit that the patient had a continuous somewhat moist cough. She was unable to bring up any phlegm. She denied chills and fever. States that the cough is been gone on off and on for a few weeks. The daughter says that she has had some allergies recently.   The patient is now living in a nursing home.  Past Medical History  Diagnosis Date  . Chronic airway obstruction, not elsewhere classified   . Allergic rhinitis, cause unspecified   . Esophageal reflux   . Iron deficiency anemia, unspecified   . Coronary atherosclerosis of unspecified type of vessel, native or graft   . Dysfunction of eustachian tube   . Obesity, unspecified   . Macular degeneration, right eye Being treated.  . Macular degeneration, left eye Blind     Past Surgical History  Procedure Laterality Date  . Coronary artery bypass graft       Current Outpatient Prescriptions  Medication Sig Dispense Refill  . ALPRAZolam (XANAX) 0.25 MG tablet Take 0.25 mg by mouth at bedtime.     [name] aspirin EC 325 MG tablet Take 1 tablet (325 mg total) by mouth daily.  0  . Calcium-Vitamin D-Vitamin K (CALCIUM SOFT CHEWS PO) Take 2 tablets by mouth daily.    . cholecalciferol (VITAMIN D) 1000 UNITS tablet Take 1,000 Units by mouth daily. Take two (2) tablets = 2000 units    . donepezil (ARICEPT) 5 MG tablet Take 5 mg by  mouth at bedtime.    [name] esomeprazole (NEXIUM) 40 MG capsule Take 40 mg by mouth daily.     . furosemide (LASIX) 20 MG tablet Take 1 tablet (20 mg total) by mouth daily. 30 tablet 6  . losartan (COZAAR) 50 MG tablet Take 50 mg by mouth daily.      . Multiple Vitamins-Minerals (EYE VITAMINS) CAPS Take 1 capsule by mouth daily.     . nadolol (CORGARD) 80 MG tablet Take 80 mg by mouth at bedtime.     [name] NAMENDA XR 28 MG CP24 Take 1 capsule by mouth at bedtime.    [name] NITROSTAT 0.4 MG SL tablet Place 0.4 mg under the tongue every 5 (five) minutes as needed for chest pain. Use as directed as needed    . potassium chloride SA (KLOR-CON M20) 20 MEQ tablet Take 1 tablet (20 mEq total) by mouth daily. 30 tablet 5  . pravastatin (PRAVACHOL) 80 MG tablet Take 1 tablet by mouth at bedtime.     . sertraline (ZOLOFT) 25 MG tablet Take 25 mg by mouth at bedtime.     . traMADol (ULTRAM) 50 MG tablet Take 50 mg by mouth every 6 (six) hours as needed.     No current facility-administered medications for this visit.    Allergies:   Doxycycline; Clarithromycin; Prinivil; and Propulsid    Social History:  The patient  reports that she has never smoked. She has never used smokeless tobacco.   Family History:  The patient's family history includes Breast cancer in her sister; Colon cancer in her brother; Heart disease in her father and mother; Hypertension in her father and mother.    ROS:  Please see the history of present illness.   Otherwise, review of systems are positive for increased memory. Unable to answer questions without her daughter's assistance.   All other systems are reviewed and negative.    PHYSICAL EXAM: VS:  BP 152/80 mmHg  Pulse 96  Ht 5\' 1"  (1.549 m)  Wt 147 lb (66.679 kg)  BMI 27.79 kg/m2  SpO2 92% , BMI Body mass index is 27.79 kg/(m^2). GEN: Well nourished, well developed, in no acute distress HEENT: normal Neck: no JVD, carotid bruits, or masses Cardiac: IIRR; no murmurs, rubs, or  gallops,no edema  Respiratory:  clear to auscultation bilaterally, normal work of breathing GI: soft, nontender, nondistended, + BS MS: no deformity or atrophy Skin: warm and dry, no rash Neuro:  Strength and sensation are intact Psych: euthymic mood, full affect   EKG:  EKG is not ordered today.    Recent Labs: 05/01/2014: ALT 16; Hemoglobin 11.5*; Platelets 263 05/05/2014: BUN 16; Creatinine 0.86; Potassium 3.4*; Sodium 137    Lipid Panel No results found for: CHOL, TRIG, HDL, CHOLHDL, VLDL, LDLCALC, LDLDIRECT    Wt Readings from Last 3 Encounters:  06/07/14 147 lb (66.679 kg)  05/05/14 148 lb (67.132 kg)  03/07/14 154 lb (69.854 kg)      Other studies Reviewed: Additional studies/ records that were reviewed today include: .    ASSESSMENT AND PLAN:  Atherosclerosis of native coronary artery of native heart without angina pectoris: Symptomatic  Essential hypertension, benign: Controlled  Chronic atrial fibrillation: Good rate control  COPD with chronic bronchitis: Significant with chronic O2 requirement. If cough continues she needs to see primary care or pulmonary to rule out infection     Current medicines are reviewed at length with the patient today.  The patient does not have concerns regarding medicines.  The following changes have been made:  no change  Labs/ tests ordered today include:  No orders of the defined types were placed in this encounter.     Disposition:   FU will be as needed if cardiac problems arise. I do not feel that she needs routine cardiac follow-up.   Signed, Lesleigh NoeSMITH III,Ahnaf Caponi W, MD  06/07/2014 4:15 PM    [facility] [address], [place], [place]  [address] Phone: [phone]; Fax: [phone]

## 2014-06-07 NOTE — Patient Instructions (Signed)
Medication Instructions:  Your physician recommends that you continue on your current medications as directed. Please refer to the Current Medication list given to you today.   Labwork: None   Testing/Procedures: None   Follow-Up: Your physician recommends that you schedule a follow-up appointment as needed   Any Other Special Instructions Will Be Listed Below (If Applicable). Follow up with your primary care physician about your cough

## 2014-06-27 ENCOUNTER — Ambulatory Visit: Payer: Medicare Other | Admitting: Internal Medicine

## 2014-07-04 ENCOUNTER — Ambulatory Visit (INDEPENDENT_AMBULATORY_CARE_PROVIDER_SITE_OTHER): Payer: Medicare HMO | Admitting: Internal Medicine

## 2014-07-04 ENCOUNTER — Ambulatory Visit (INDEPENDENT_AMBULATORY_CARE_PROVIDER_SITE_OTHER)
Admission: RE | Admit: 2014-07-04 | Discharge: 2014-07-04 | Disposition: A | Discharge status: Home / Self Care | Payer: Medicare HMO | Source: Ambulatory Visit | Attending: Internal Medicine | Admitting: Internal Medicine

## 2014-07-04 ENCOUNTER — Encounter: Payer: Self-pay | Admitting: Internal Medicine

## 2014-07-04 VITALS — BP 120/72 | HR 86 | Ht 61.0 in | Wt 146.8 lb

## 2014-07-04 DIAGNOSIS — J449 Chronic obstructive pulmonary disease, unspecified: Secondary | ICD-10-CM | POA: Diagnosis not present

## 2014-07-04 DIAGNOSIS — I482 Chronic atrial fibrillation, unspecified: Secondary | ICD-10-CM

## 2014-07-04 NOTE — Patient Instructions (Signed)
Ok to continue oxygen at 2L/ [facility]  Order CXR  Dx COPD with chronic bronchitis  Please call as needed

## 2014-07-04 NOTE — Progress Notes (Signed)
Patient ID: Ellen [name], female    DOB: [birth year], 81 y.o.   MRN: [id number]  HPI 07/26/10- 81 yo never smoker, followed for COPD, allergic rhinitis, complicated by CAD, renal insufficiency, anemia  and GERD Last here February 26, 2010- did have a bronchitis and an episode of gastroenteritis with dehydration, but those have resolved. Felt that pollen bothered her this Spring. Yesterday was more short of breath. Had gone to see Dr Smith/cardiology- she hurried to get there and lost her breath for awhile. He told her she was retaining some fluid. Hgb recently 10- given injection by Dr Myna HidalgoEnnever. Wears oxygen for sleep at 2 L/M. We reviewed her 6MWT done last year. She had portable O2 in the past but couldn't see well enough for it to be worthwhile and turned it in.  PFT 03/05/10- moderate obstruction FEV1/ FVC 0.65, insignif resp to BD. Unable to perform LV or DLCO.  04/23/11- 81 yo never smoker, followed for COPD, allergic rhinitis, complicated by CAD, renal insufficiency, anemia  and GERD Daughter is here. Not breathing well. [facility] provided light portable pulse oxygen. Some mild cough with occasional scant, thick, white sputum. Had chest pain last week and evaluated by Dr. Smith/cardiology and she understands he felt she was okay. Noticing some confusion, loss of memory. Her primary physician asked her to use oxygen more consistently. Her hematologist is watching chronic anemia. Can't afford Spiriva.  08/23/11- 81 yo never smoker, followed for COPD, allergic rhinitis, complicated by CAD, renal insufficiency, anemia  and GERD Not breathing well today-been more active; having increased SOB Increased shortness of breath for several weeks. No distinct infection but she can't rule out a mild cold at the beginning. Loose cough and hacking. She took some Lasix recently put her feet started swelling.  12/23/11-84 yo never smoker, followed for COPD, allergic rhinitis, complicated by CAD, renal  insufficiency, anemia  and GERD FOLLOWS FOR:SOB with rushed activity or increased anxiety/stress     friend here COPD assessment test (CAT) score 34/40 Cough is productive of clear thick sputum. She continues oxygen at 2 L/[facility]. Sleeps upright in chair but planning to get a hospital bed CXR 08/30/11-reviewed IMPRESSION:  Stable exam. Chronic elevation of the left hemidiaphragm with left  basilar scarring and / or atelectasis. Chronic bronchitic changes.  Original Report Authenticated By: Andreas NewportGEOFFREY LAMKE, M.D.   06/23/12- -81 yo never smoker, followed for COPD, allergic rhinitis, complicated by CAD, renal insufficiency, anemia  and GERD  Daughter here FOLLOWS FOR: increased SOB and wheezing more than usual; daughter states patient had bronchitis couple months ago and had hard time getting over it; Pt states she has noticed more weakness and dizziness. Weak and dizzy for 3 days when she ran out of blood pressure pills. Bronchitis in March was treated by her primary physician. Continues oxygen 2 L/[facility]  12/22/12- 81 yo never smoker, followed for COPD, allergic rhinitis, complicated by CAD, renal insufficiency, anemia  and GERD  Daughter here FOLLOWS FOR: recently felt like she had a "cold" x 1 week; PND, and unable to breath. Residual stuffy nose. CXR 06/23/12 IMPRESSION:  No edema or consolidation. Stable elevation of the left  hemidiaphragm. Heart mildly enlarged but stable.   Original Report Authenticated By: Bretta BangWilliam Woodruff, M.D.  06/22/13-87 yo never smoker, followed for COPD, allergic rhinitis, elevated left diaphragm, complicated by CAD, renal insufficiency, anemia  and GERD  Daughter here FOLLOWS FOR: Increased SOB with activity-resides in rest home now. Desat to 88% on arrival  on room walking, 92% at rest. No acute events since last here. Occasional weak spells, cough, chest pains and dizziness but no wheeze.  12/27/13- 81 yo never smoker, followed for COPD/ chronic bronchitis with  mucous plugs, allergic rhinitis, elevated left diaphragm, complicated by CAD, renal insufficiency, anemia and GERD  Daughter here   O2 2l/ [facility] SOB "comes and goes."  Coughing occas - mostly nonprod.  Relates dyspnea to weather changes. CXR 09/06/13 IMPRESSION: 1. Radiographic appearance of the chest is unchanged, with persistent multifocal reticulonodular opacities throughout the lungs bilaterally, compatible with areas of mucoid impaction within dilated bronchi and bronchioles noted on recent chest CT. Electronically Signed  By: Trudie Reedaniel Entrikin M.D.  On: 09/06/2013 16:01  07/04/14- 81 yo never smoker, followed for COPD/ chronic bronchitis with mucous plugs, allergic rhinitis, elevated left diaphragm, complicated by CAD/ AFib, renal insufficiency, anemia and GERD  Daughter here   O2 2l/ [facility] FOLLOWS FOR: Pt states she has to pace herself; gives out quickly. [facility] Blamed increased cough on spring pollen. Stable exertional dyspnea. Not much cough now and she feels at baseline. CXR 05/01/14 IMPRESSION: Persistently elevated LEFT hemidiaphragm with new LEFT lower lobe airspace opacity which may reflect atelectasis or pneumonia. Increasing linear densities extend at LEFT hilum, stable RIGHT midlung zone reticular nodular densities previously attributed to mucoid impaction. Stable cardiomegaly. Electronically Signed  By: Awilda Metroourtnay Bloomer  On: 05/01/2014 23:08  ROS-see HPI Constitutional:   No-   weight loss, night sweats, fevers, chills, fatigue, lassitude, + weakness HEENT:   No-  headaches, difficulty swallowing, tooth/dental problems, sore throat,       No-  sneezing, itching, ear ache, no-nasal congestion, post nasal drip,  CV:   No-chest pain, orthopnea, PND, swelling in lower extremities, +dizziness, no-palpitations Resp: +  shortness of breath with exertion or at rest.              No- productive cough,  + non-productive cough,  No-  coughing up of blood.              No-   change in color of mucus.  No- wheezing.   Skin: No-   rash or lesions. GI:  No-   heartburn, indigestion, abdominal pain, nausea, vomiting GU:  MS:  No-   joint pain or swelling.   Neuro-     nothing unusual Psych:  No- change in mood or affect. No depression or anxiety.  No memory loss.  OBJ- Physical Exam  General- Alert, Oriented, Affect-appropriate, Distress- none acute, O2 sat 93% on 2 L pulsed. Skin- rash-none, lesions- none, excoriation- none Lymphadenopathy- none Head- atraumatic            Eyes- +indicates vision diminished, PERRLA, conjunctivae and secretions clear            Ears- Hearing, canals-normal            Nose- Clear, no-Septal dev, mucus, polyps, erosion, perforation             Throat- Mallampati II , mucosa clear , drainage- none, tonsils- atrophic Neck- flexible , trachea midline, no stridor , thyroid nl, carotid no bruit Chest - symmetrical excursion , unlabored           Heart/CV- IRR/ AFib , no murmur , no gallop  , no rub, nl s1 s2                           - +  1 cm , edema- none, stasis changes- none, varices- none           Lung- clear, wheeze- none, cough- none , dullness+left base, rub- none           Chest wall-  Abd-  Br/ Gen/ Rectal- Not done, not indicated Extrem- cane + Neuro- grossly intact to observation, calm, + she has to be reminded of my name

## 2014-07-07 ENCOUNTER — Ambulatory Visit (HOSPITAL_BASED_OUTPATIENT_CLINIC_OR_DEPARTMENT_OTHER): Payer: Medicare HMO | Admitting: Family

## 2014-07-07 ENCOUNTER — Other Ambulatory Visit (HOSPITAL_BASED_OUTPATIENT_CLINIC_OR_DEPARTMENT_OTHER): Payer: Medicare HMO

## 2014-07-07 ENCOUNTER — Encounter: Payer: Self-pay | Admitting: Family

## 2014-07-07 VITALS — BP 151/75 | HR 88 | Temp 98.2°F | Resp 16 | Ht 61.0 in | Wt 145.0 lb

## 2014-07-07 DIAGNOSIS — D509 Iron deficiency anemia, unspecified: Secondary | ICD-10-CM | POA: Diagnosis not present

## 2014-07-07 DIAGNOSIS — D631 Anemia in chronic kidney disease: Secondary | ICD-10-CM

## 2014-07-07 DIAGNOSIS — N189 Chronic kidney disease, unspecified: Secondary | ICD-10-CM

## 2014-07-07 LAB — CBC WITH DIFFERENTIAL (CANCER CENTER ONLY)
BASO#: 0.1 10*3/uL (ref 0.0–0.2)
BASO%: 0.7 % (ref 0.0–2.0)
EOS%: 3.9 % (ref 0.0–7.0)
Eosinophils Absolute: 0.3 10*3/uL (ref 0.0–0.5)
HCT: 37.4 % (ref 34.8–46.6)
HGB: 12.2 g/dL (ref 11.6–15.9)
LYMPH#: 1.6 10*3/uL (ref 0.9–3.3)
LYMPH%: 24.3 % (ref 14.0–48.0)
MCH: 30.1 pg (ref 26.0–34.0)
MCHC: 32.6 g/dL (ref 32.0–36.0)
MCV: 92 fL (ref 81–101)
MONO#: 0.7 10*3/uL (ref 0.1–0.9)
MONO%: 10.5 % (ref 0.0–13.0)
NEUT#: 4.1 10*3/uL (ref 1.5–6.5)
NEUT%: 60.6 % (ref 39.6–80.0)
Platelets: 276 10*3/uL (ref 145–400)
RBC: 4.05 10*6/uL (ref 3.70–5.32)
RDW: 13.2 % (ref 11.1–15.7)
WBC: 6.7 10*3/uL (ref 3.9–10.0)

## 2014-07-07 LAB — COMPREHENSIVE METABOLIC PANEL
ALT: 26 U/L (ref 0–35)
AST: 26 U/L (ref 0–37)
Albumin: 4 g/dL (ref 3.5–5.2)
Alkaline Phosphatase: 89 U/L (ref 39–117)
BILIRUBIN TOTAL: 0.6 mg/dL (ref 0.2–1.2)
BUN: 15 mg/dL (ref 6–23)
CHLORIDE: 91 meq/L — AB (ref 96–112)
CO2: 34 meq/L — AB (ref 19–32)
CREATININE: 0.85 mg/dL (ref 0.50–1.10)
Calcium: 9.7 mg/dL (ref 8.4–10.5)
Glucose, Bld: 118 mg/dL — ABNORMAL HIGH (ref 70–99)
Potassium: 4.8 mEq/L (ref 3.5–5.3)
Sodium: 135 mEq/L (ref 135–145)
Total Protein: 6.8 g/dL (ref 6.0–8.3)

## 2014-07-07 NOTE — Progress Notes (Signed)
Hematology and Oncology Follow Up Visit  Ellen PettyBarbara [name] [id number] 1926-12-25 81 y.o. 07/07/2014   Principle Diagnosis:  1. Anemia of renal insufficiency.  2. Intermittent iron-deficiency anemia.    Current Therapy:   1. Aranesp 300 mcg as needed for hemoglobin less than 11.  2. IV iron as indicated.    Interim History: [name] is here today with her daughter for a follow-up. She is doing fairly well since we saw her in January. She is now on 2L [place] 24 hours a day. Her SOB is unchanged. It is worsened with exertion.  Her last dose of iron was in May 2015 and she has responded nicely.  She denies fever, chills, n/v, rash, headache, chest pain, palpitations, abdominal pain, constipation, diarrhea, blood in urine or stool.  She does have a cough. She states that this is due to allergies. She has coughed some phlegm up a few times.  She is followed by cardiology for congestive heart failure. She takes lasix daily.  No swelling, tenderness, numbness or tingling in her extremities. No new aches or pains.  Her appetite is good and she is staying hydrated. Her weight is stable.  She is living in an assisted living facility and enjoys it there.   Medications:    Medication List       This list is accurate as of: 07/07/14  2:54 PM.  Always use your most recent med list.               ALPRAZolam 0.25 MG tablet  Commonly known as:  XANAX  Take 0.25 mg by mouth at bedtime.     aspirin EC 325 MG tablet  Take 1 tablet (325 mg total) by mouth daily.     CALCIUM SOFT CHEWS PO  Take 2 tablets by mouth daily.     esomeprazole 40 MG capsule  Commonly known as:  NEXIUM  Take 40 mg by mouth daily.     EYE VITAMINS Caps  Take 1 capsule by mouth daily.     furosemide 20 MG tablet  Commonly known as:  LASIX  Take 1 tablet (20 mg total) by mouth daily.     losartan 50 MG tablet  Commonly known as:  COZAAR  Take 50 mg by mouth daily.     nadolol 80 MG tablet  Commonly known as:   CORGARD  Take 80 mg by mouth at bedtime.     NITROSTAT 0.4 MG SL tablet  Generic drug:  nitroGLYCERIN  Place 0.4 mg under the tongue every 5 (five) minutes as needed for chest pain. Use as directed as needed     potassium chloride SA 20 MEQ tablet  Commonly known as:  KLOR-CON M20  Take 1 tablet (20 mEq total) by mouth daily.     traMADol 50 MG tablet  Commonly known as:  ULTRAM  Take 50 mg by mouth every 6 (six) hours as needed.        Allergies:  Allergies  Allergen Reactions  . Doxycycline Other (See Comments)    Destroyed good bacteria in GI tract  . Clarithromycin Other (See Comments)    H/A .[name]...patient unsure of reaction    . Prinivil [Lisinopril] Other (See Comments)    Patient unsure of reaction  . Propulsid [Cisapride] Other (See Comments)    Patient unsure of reaction    Past Medical History, Surgical history, Social history, and Family History were reviewed and updated.  Review of Systems: All other 10 point review  of systems is negative.   Physical Exam:  vitals were not taken for this visit.  Wt Readings from Last 3 Encounters:  07/04/14 146 lb 12.8 oz (66.588 kg)  06/07/14 147 lb (66.679 kg)  05/05/14 148 lb (67.132 kg)    Ocular: Sclerae unicteric, pupils equal, round and reactive to light Ear-nose-throat: Oropharynx clear, dentition fair Lymphatic: No cervical or supraclavicular adenopathy Lungs no rales or rhonchi, good excursion bilaterally Heart regular rate and rhythm, no murmur appreciated Abd soft, nontender, positive bowel sounds MSK no focal spinal tenderness, no joint edema Neuro: non-focal, well-oriented, appropriate affect Breasts: Deferred  Lab Results  Component Value Date   WBC 6.7 07/07/2014   HGB 12.2 07/07/2014   HCT 37.4 07/07/2014   MCV 92 07/07/2014   PLT 276 07/07/2014   Lab Results  Component Value Date   FERRITIN 2,050* 03/07/2014   IRON 59 03/07/2014   TIBC 301 03/07/2014   UIBC 242 03/07/2014   IRONPCTSAT  20* 03/07/2014   Lab Results  Component Value Date   RETICCTPCT 1.4 06/30/2013   RBC 4.05 07/07/2014   RETICCTABS 61.3 06/30/2013   No results found for: KPAFRELGTCHN, LAMBDASER, KAPLAMBRATIO No results found for: IGGSERUM, IGA, IGMSERUM No results found for: Marda StalkerOTALPROTELP, ALBUMINELP, A1GS, A2GS, BETS, BETA2SER, GAMS, MSPIKE, SPEI   Chemistry      Component Value Date/Time   NA 137 05/05/2014 1617   K 3.4* 05/05/2014 1617   CL 93* 05/05/2014 1617   CO2 38* 05/05/2014 1617   BUN 16 05/05/2014 1617   CREATININE 0.86 05/05/2014 1617      Component Value Date/Time   CALCIUM 8.7 05/05/2014 1617   ALKPHOS 94 05/01/2014 2137   AST 27 05/01/2014 2137   ALT 16 05/01/2014 2137   BILITOT 0.6 05/01/2014 2137     Impression and Plan: [name] is a 81-year-old white female with multi-factorial anemia. She is doing fairly well. She has some SOB and is now on supplemental oxygen 24 hours a day.  Her CBC today looks good. We will see what her iron studies show.  She will not need an Aranesp injection today.  We will see her back in 4 months for labs and follow-up.  She and her daughter both know to call here with any questions or concerns. We can certainly see her back sooner if need be.   Verdie Mosher[place],Nataley Bahri M, NP 5/19/20162:54 PM

## 2014-07-08 ENCOUNTER — Other Ambulatory Visit: Payer: Self-pay | Admitting: Family

## 2014-07-08 LAB — IRON AND TIBC CHCC
%SAT: 17 % — ABNORMAL LOW (ref 21–57)
IRON: 46 ug/dL (ref 41–142)
TIBC: 275 ug/dL (ref 236–444)
UIBC: 230 ug/dL (ref 120–384)

## 2014-07-08 LAB — FERRITIN CHCC: Ferritin: 1772 ng/ml — ABNORMAL HIGH (ref 9–269)

## 2014-07-13 ENCOUNTER — Ambulatory Visit (HOSPITAL_BASED_OUTPATIENT_CLINIC_OR_DEPARTMENT_OTHER): Payer: Medicare HMO

## 2014-07-13 ENCOUNTER — Other Ambulatory Visit: Payer: Self-pay | Admitting: Family

## 2014-07-13 ENCOUNTER — Telehealth: Payer: Self-pay | Admitting: Hematology & Oncology

## 2014-07-13 VITALS — BP 129/73 | HR 84 | Temp 99.6°F | Resp 16

## 2014-07-13 DIAGNOSIS — D509 Iron deficiency anemia, unspecified: Secondary | ICD-10-CM | POA: Diagnosis not present

## 2014-07-13 MED ORDER — SODIUM CHLORIDE 0.9 % IV SOLN
510.0000 mg | Freq: Once | INTRAVENOUS | Status: AC
  Administered 2014-07-13: 510 mg via INTRAVENOUS
  Filled 2014-07-13: qty 17

## 2014-07-13 MED ORDER — SODIUM CHLORIDE 0.9 % IV SOLN
INTRAVENOUS | Status: DC
  Administered 2014-07-13: 14:00:00 via INTRAVENOUS

## 2014-07-13 NOTE — Patient Instructions (Signed)

## 2014-07-13 NOTE — Telephone Encounter (Signed)
Patient and daughter came into office and cx 11/02/14 apt and resch for 11/03/14

## 2014-07-24 NOTE — Assessment & Plan Note (Signed)
She and her daughter associated increased cough with spring pollen season, now resolved. Plan- continue routine treatment

## 2014-07-24 NOTE — Assessment & Plan Note (Signed)
Controlled ventricular response rate at this exam

## 2014-09-18 ENCOUNTER — Emergency Department (HOSPITAL_BASED_OUTPATIENT_CLINIC_OR_DEPARTMENT_OTHER): Payer: Medicare HMO

## 2014-09-18 ENCOUNTER — Emergency Department (HOSPITAL_BASED_OUTPATIENT_CLINIC_OR_DEPARTMENT_OTHER)
Admission: EM | Admit: 2014-09-18 | Discharge: 2014-09-18 | Disposition: A | Discharge status: Home / Self Care | Payer: Medicare HMO | Attending: Emergency Medicine | Admitting: Emergency Medicine

## 2014-09-18 ENCOUNTER — Encounter (HOSPITAL_BASED_OUTPATIENT_CLINIC_OR_DEPARTMENT_OTHER): Payer: Self-pay

## 2014-09-18 DIAGNOSIS — J449 Chronic obstructive pulmonary disease, unspecified: Secondary | ICD-10-CM | POA: Insufficient documentation

## 2014-09-18 DIAGNOSIS — Z7982 Long term (current) use of aspirin: Secondary | ICD-10-CM | POA: Insufficient documentation

## 2014-09-18 DIAGNOSIS — Y9389 Activity, other specified: Secondary | ICD-10-CM | POA: Diagnosis not present

## 2014-09-18 DIAGNOSIS — Z8669 Personal history of other diseases of the nervous system and sense organs: Secondary | ICD-10-CM | POA: Diagnosis not present

## 2014-09-18 DIAGNOSIS — W1839XA Other fall on same level, initial encounter: Secondary | ICD-10-CM | POA: Diagnosis not present

## 2014-09-18 DIAGNOSIS — S4991XA Unspecified injury of right shoulder and upper arm, initial encounter: Secondary | ICD-10-CM | POA: Diagnosis not present

## 2014-09-18 DIAGNOSIS — K219 Gastro-esophageal reflux disease without esophagitis: Secondary | ICD-10-CM | POA: Diagnosis not present

## 2014-09-18 DIAGNOSIS — Z862 Personal history of diseases of the blood and blood-forming organs and certain disorders involving the immune mechanism: Secondary | ICD-10-CM | POA: Diagnosis not present

## 2014-09-18 DIAGNOSIS — T148 Other injury of unspecified body region: Secondary | ICD-10-CM | POA: Diagnosis not present

## 2014-09-18 DIAGNOSIS — Y92128 Other place in nursing home as the place of occurrence of the external cause: Secondary | ICD-10-CM | POA: Insufficient documentation

## 2014-09-18 DIAGNOSIS — Y998 Other external cause status: Secondary | ICD-10-CM | POA: Insufficient documentation

## 2014-09-18 DIAGNOSIS — F0390 Unspecified dementia without behavioral disturbance: Secondary | ICD-10-CM | POA: Insufficient documentation

## 2014-09-18 DIAGNOSIS — E669 Obesity, unspecified: Secondary | ICD-10-CM | POA: Insufficient documentation

## 2014-09-18 DIAGNOSIS — W19XXXA Unspecified fall, initial encounter: Secondary | ICD-10-CM

## 2014-09-18 DIAGNOSIS — Z79899 Other long term (current) drug therapy: Secondary | ICD-10-CM | POA: Diagnosis not present

## 2014-09-18 DIAGNOSIS — T07XXXA Unspecified multiple injuries, initial encounter: Secondary | ICD-10-CM

## 2014-09-18 DIAGNOSIS — I2510 Atherosclerotic heart disease of native coronary artery without angina pectoris: Secondary | ICD-10-CM | POA: Insufficient documentation

## 2014-09-18 DIAGNOSIS — S199XXA Unspecified injury of neck, initial encounter: Secondary | ICD-10-CM | POA: Diagnosis present

## 2014-09-18 NOTE — ED Notes (Signed)
Family at bedside. 

## 2014-09-18 NOTE — ED Notes (Signed)
[facility] called for transport.  

## 2014-09-18 NOTE — ED Provider Notes (Signed)
CSN: [id number]     Arrival date & time 09/18/14  4098 History   First MD Initiated Contact with Patient 09/18/14 [phone]     Chief Complaint  Patient presents with  . Fall      HPI  Patient presents from a memory care facility where she resides with a history of dementia. Called out to staff is wearing that she had fallen in her room. Complains of pain primarily in the posterior neck and right shoulder.  Apparently staff heard her fall and then she immediately called out. Doubt loss of consciousness. Per the staff report to EMS she is "at her baseline".  Past Medical History  Diagnosis Date  . Chronic airway obstruction, not elsewhere classified   . Allergic rhinitis, cause unspecified   . Esophageal reflux   . Iron deficiency anemia, unspecified   . Coronary atherosclerosis of unspecified type of vessel, native or graft   . Dysfunction of eustachian tube   . Obesity, unspecified   . Macular degeneration, right eye Being treated.  . Macular degeneration, left eye Blind    Past Surgical History  Procedure Laterality Date  . Coronary artery bypass graft     Family History  Problem Relation Age of Onset  . Colon cancer Brother   . Breast cancer Sister   . Heart disease Father   . Hypertension Father   . Heart disease Mother   . Hypertension Mother    History  Substance Use Topics  . Smoking status: Never Smoker   . Smokeless tobacco: Never Used     Comment: Never used tobacco  . Alcohol Use: Not on file   OB History    No data available     Review of Systems  Unable to perform ROS: Dementia      Allergies  Doxycycline; Clarithromycin; Prinivil; and Propulsid  Home Medications   Prior to Admission medications   Medication Sig Start Date End Date Taking? Authorizing Provider  ALPRAZolam (XANAX) 0.25 MG tablet Take 0.25 mg by mouth at bedtime.     Historical Provider, MD  aspirin EC 325 MG tablet Take 1 tablet (325 mg total) by mouth daily. 05/05/14   Dwana Melena, PA-C  Calcium-Vitamin D-Vitamin K (CALCIUM SOFT CHEWS PO) Take 2 tablets by mouth daily.    Historical Provider, MD  cholecalciferol (VITAMIN D) 1000 UNITS tablet Take 2,000 Units by mouth daily.    Historical Provider, MD  esomeprazole (NEXIUM) 40 MG capsule Take 40 mg by mouth daily.     Historical Provider, MD  furosemide (LASIX) 20 MG tablet Take 1 tablet (20 mg total) by mouth daily. 05/06/14   Lyn Records, MD  losartan (COZAAR) 50 MG tablet Take 50 mg by mouth daily.      Historical Provider, MD  memantine (NAMENDA) 10 MG tablet Take 10 mg by mouth at bedtime as needed.    Historical Provider, MD  Multiple Vitamins-Minerals (EYE VITAMINS) CAPS Take 1 capsule by mouth daily.     Historical Provider, MD  nadolol (CORGARD) 80 MG tablet Take 80 mg by mouth at bedtime.     Historical Provider, MD  NITROSTAT 0.4 MG SL tablet Place 0.4 mg under the tongue every 5 (five) minutes as needed for chest pain. Use as directed as needed 11/29/12   Historical Provider, MD  potassium chloride SA (KLOR-CON M20) 20 MEQ tablet Take 1 tablet (20 mEq total) by mouth daily. 05/05/14   Dwana Melena, PA-C  pravastatin (PRAVACHOL)  20 MG tablet Take 20 mg by mouth at bedtime as needed.    Historical Provider, MD  sertraline (ZOLOFT) 50 MG tablet Take 50 mg by mouth daily.    Historical Provider, MD  traMADol (ULTRAM) 50 MG tablet Take 50 mg by mouth every 6 (six) hours as needed.    Historical Provider, MD   BP 146/77 mmHg  Pulse 101  Temp(Src) 98.1 F (36.7 C)  Resp 20  SpO2 96% Physical Exam  Constitutional: She appears well-developed and well-nourished. No distress.  HENT:  Head: Normocephalic.  Eyes: Conjunctivae are normal. Pupils are equal, round, and reactive to light. No scleral icterus.  Neck: Normal range of motion. Neck supple. No thyromegaly present.  Cardiovascular: Normal rate and regular rhythm.  Exam reveals no gallop and no friction rub.   No murmur heard. Pulmonary/Chest: Effort  normal and breath sounds normal. No respiratory distress. She has no wheezes. She has no rales.  Abdominal: Soft. Bowel sounds are normal. She exhibits no distension. There is no tenderness. There is no rebound.  Musculoskeletal: Normal range of motion.       Back:  Neurological:  Dementia. Moving all 4 extremities.  Skin: Skin is warm and dry. No rash noted.  Psychiatric: She has a normal mood and affect. Her behavior is normal.    ED Course  Procedures (including critical care time) Labs Review Labs Reviewed - No data to display  Imaging Review Dg Thoracic Spine 2 View  09/18/2014   CLINICAL DATA:  Acute upper thoracic pain following fall today. Initial encounter.  EXAM: THORACIC SPINE 2 VIEWS  COMPARISON:  07/04/2014 and prior chest radiographs  FINDINGS: Normal alignment is noted.  There is no evidence of acute fracture or subluxation.  Mild multilevel degenerative disc disease identified.  No focal bony lesions are identified.  CABG changes are again noted.  IMPRESSION: No evidence of acute bony abnormality.   Electronically Signed   By: Harmon Pier M.D.   On: 09/18/2014 08:17   Dg Shoulder Right  09/18/2014   CLINICAL DATA:  Unwitnessed fall.  Shoulder pain  EXAM: RIGHT SHOULDER - 2+ VIEW  COMPARISON:  None.  FINDINGS: Glenohumeral joint is intact. No evidence of scapular fracture or humeral fracture. The acromioclavicular joint is intact.  IMPRESSION: No fracture or dislocation.   Electronically Signed   By: Genevive Bi M.D.   On: 09/18/2014 08:16   Ct Head Wo Contrast  09/18/2014   CLINICAL DATA:  Unwitnessed fall, now with posterior neck pain.  EXAM: CT HEAD WITHOUT CONTRAST  CT CERVICAL SPINE WITHOUT CONTRAST  TECHNIQUE: Multidetector CT imaging of the head and cervical spine was performed following the standard protocol without intravenous contrast. Multiplanar CT image reconstructions of the cervical spine were also generated.  COMPARISON:  Head CT - 05/18/2012  FINDINGS: CT  HEAD FINDINGS  Examination is minimally degraded secondary to patient motion, necessitating the acquisition of additional images.  Similar findings of advanced atrophy with sulcal prominence and centralized volume loss with commensurate ex vacuo dilatation the ventricular system. Scattered periventricular hypodensities compatible with microvascular ischemic disease. Unchanged punctate calcification in the right basal ganglia. Given extensive background parenchymal abnormalities, there is no CT evidence of superimposed acute large territory infarct. No intraparenchymal or extra-axial mass or hemorrhage. Unchanged size and configuration of the ventricles and basilar cisterns. No midline shift. Intracranial atherosclerosis.  Regional soft tissues appear normal. Post bilateral cataract surgery. Limited visualization the paranasal sinuses and mastoid air cells is normal. No  air-fluid levels. No displaced calvarial fracture.  CT CERVICAL SPINE FINDINGS  C1 to the superior endplate of T3 is imaged.  There is minimal (approximately 1-2 mm) of anterolisthesis of C3 upon C4 and C7 upon T1. The dens is normally positioned between the lateral masses of C1. Normal atlantodental and atlantoaxial articulations. The bilateral facets are normally aligned.  No fracture or static subluxation of cervical spine. Cervical vertebral body heights are preserved. Prevertebral soft tissues are normal.  Mild to moderate multilevel cervical spine DDD, worse at C5-C6 and C6-C7 with disc space height loss, endplate irregularity and small posteriorly directed disc osteophyte complexes at these locations. There is partial ossification the nuchal ligament posterior to the C3 spinous process.  Atherosclerotic plaque within the bilateral carotid bulbs. There is mild diffuse heterogeneity of the thyroid parenchyma without discrete nodule on this noncontrast examination. No bulky cervical lymphadenopathy on this noncontrast examination.  Limited  visualization of lung apices demonstrates ill-defined slightly nodular opacities within the imaged portions of the bilateral lung apices (representative images 75 and 83, series 7).  IMPRESSION: 1. Similar findings of advanced atrophy and microvascular ischemic disease without acute intracranial process. 2. No fracture or static subluxation of the cervical spine. 3. Mild to moderate multilevel cervical spine DDD, worse at C5-C6 and C6-C7.   Electronically Signed   By: Simonne Come M.D.   On: 09/18/2014 08:13   Ct Cervical Spine Wo Contrast  09/18/2014   CLINICAL DATA:  Unwitnessed fall, now with posterior neck pain.  EXAM: CT HEAD WITHOUT CONTRAST  CT CERVICAL SPINE WITHOUT CONTRAST  TECHNIQUE: Multidetector CT imaging of the head and cervical spine was performed following the standard protocol without intravenous contrast. Multiplanar CT image reconstructions of the cervical spine were also generated.  COMPARISON:  Head CT - 05/18/2012  FINDINGS: CT HEAD FINDINGS  Examination is minimally degraded secondary to patient motion, necessitating the acquisition of additional images.  Similar findings of advanced atrophy with sulcal prominence and centralized volume loss with commensurate ex vacuo dilatation the ventricular system. Scattered periventricular hypodensities compatible with microvascular ischemic disease. Unchanged punctate calcification in the right basal ganglia. Given extensive background parenchymal abnormalities, there is no CT evidence of superimposed acute large territory infarct. No intraparenchymal or extra-axial mass or hemorrhage. Unchanged size and configuration of the ventricles and basilar cisterns. No midline shift. Intracranial atherosclerosis.  Regional soft tissues appear normal. Post bilateral cataract surgery. Limited visualization the paranasal sinuses and mastoid air cells is normal. No air-fluid levels. No displaced calvarial fracture.  CT CERVICAL SPINE FINDINGS  C1 to the superior  endplate of T3 is imaged.  There is minimal (approximately 1-2 mm) of anterolisthesis of C3 upon C4 and C7 upon T1. The dens is normally positioned between the lateral masses of C1. Normal atlantodental and atlantoaxial articulations. The bilateral facets are normally aligned.  No fracture or static subluxation of cervical spine. Cervical vertebral body heights are preserved. Prevertebral soft tissues are normal.  Mild to moderate multilevel cervical spine DDD, worse at C5-C6 and C6-C7 with disc space height loss, endplate irregularity and small posteriorly directed disc osteophyte complexes at these locations. There is partial ossification the nuchal ligament posterior to the C3 spinous process.  Atherosclerotic plaque within the bilateral carotid bulbs. There is mild diffuse heterogeneity of the thyroid parenchyma without discrete nodule on this noncontrast examination. No bulky cervical lymphadenopathy on this noncontrast examination.  Limited visualization of lung apices demonstrates ill-defined slightly nodular opacities within the imaged portions of the bilateral  lung apices (representative images 75 and 83, series 7).  IMPRESSION: 1. Similar findings of advanced atrophy and microvascular ischemic disease without acute intracranial process. 2. No fracture or static subluxation of the cervical spine. 3. Mild to moderate multilevel cervical spine DDD, worse at C5-C6 and C6-C7.   Electronically Signed   By: Simonne Come M.D.   On: 09/18/2014 08:13     EKG Interpretation None      MDM   Final diagnoses:  Fall  Multiple contusions      Extremities show no acute abnormalities. Removed from the EKG ED, and cervical collar. Full range of motion of the neck. Some discomfort with movement of the shoulder but no crepitus. She is appropriate for discharge back to her care facility.    Rolland Porter, MD 09/18/14 [phone]

## 2014-09-18 NOTE — Discharge Instructions (Signed)
Contusion °A contusion is a deep bruise. Contusions happen when an injury causes bleeding under the skin. Signs of bruising include pain, puffiness (swelling), and discolored skin. The contusion may turn blue, purple, or yellow. °HOME CARE  °· Put ice on the injured area. °¨ Put ice in a plastic bag. °¨ Place a towel between your skin and the bag. °¨ Leave the ice on for 15-20 minutes, 03-04 times a day. °· Only take medicine as told by your doctor. °· Rest the injured area. °· If possible, raise (elevate) the injured area to lessen puffiness. °GET HELP RIGHT AWAY IF:  °· You have more bruising or puffiness. °· You have pain that is getting worse. °· Your puffiness or pain is not helped by medicine. °MAKE SURE YOU:  °· Understand these instructions. °· Will watch your condition. °· Will get help right away if you are not doing well or get worse. °Document Released: 07/24/2007 Document Revised: 04/29/2011 Document Reviewed: 12/10/2010 °[employer]® Patient Information ©2015 [employer]. This information is not intended to replace advice given to you by your health care provider. Make sure you discuss any questions you have with your health care provider. ° °

## 2014-09-18 NOTE — ED Notes (Signed)
Patient arrived by [facility] from memory care facility after unwitnessed fall. Patient arrived with collar and KED. Complains of posterior neck, back and right shoulder pain. No loc. Patient at baseline per nursing home staff

## 2014-09-25 ENCOUNTER — Emergency Department (HOSPITAL_BASED_OUTPATIENT_CLINIC_OR_DEPARTMENT_OTHER): Payer: Medicare HMO

## 2014-09-25 ENCOUNTER — Inpatient Hospital Stay (HOSPITAL_BASED_OUTPATIENT_CLINIC_OR_DEPARTMENT_OTHER)
Admission: EM | Admit: 2014-09-25 | Discharge: 2014-10-03 | DRG: 470 | Disposition: A | Discharge status: Skilled Nursing Facility | Payer: Medicare HMO | Attending: Internal Medicine | Admitting: Internal Medicine

## 2014-09-25 ENCOUNTER — Encounter (HOSPITAL_BASED_OUTPATIENT_CLINIC_OR_DEPARTMENT_OTHER): Payer: Self-pay | Admitting: *Deleted

## 2014-09-25 DIAGNOSIS — R69 Illness, unspecified: Secondary | ICD-10-CM | POA: Diagnosis not present

## 2014-09-25 DIAGNOSIS — R739 Hyperglycemia, unspecified: Secondary | ICD-10-CM | POA: Diagnosis present

## 2014-09-25 DIAGNOSIS — I10 Essential (primary) hypertension: Secondary | ICD-10-CM | POA: Diagnosis present

## 2014-09-25 DIAGNOSIS — J449 Chronic obstructive pulmonary disease, unspecified: Secondary | ICD-10-CM | POA: Diagnosis present

## 2014-09-25 DIAGNOSIS — M542 Cervicalgia: Secondary | ICD-10-CM | POA: Diagnosis not present

## 2014-09-25 DIAGNOSIS — D509 Iron deficiency anemia, unspecified: Secondary | ICD-10-CM | POA: Diagnosis not present

## 2014-09-25 DIAGNOSIS — I429 Cardiomyopathy, unspecified: Secondary | ICD-10-CM | POA: Diagnosis present

## 2014-09-25 DIAGNOSIS — Z23 Encounter for immunization: Secondary | ICD-10-CM | POA: Diagnosis not present

## 2014-09-25 DIAGNOSIS — R7989 Other specified abnormal findings of blood chemistry: Secondary | ICD-10-CM | POA: Diagnosis present

## 2014-09-25 DIAGNOSIS — R1310 Dysphagia, unspecified: Secondary | ICD-10-CM | POA: Diagnosis not present

## 2014-09-25 DIAGNOSIS — R2681 Unsteadiness on feet: Secondary | ICD-10-CM | POA: Diagnosis not present

## 2014-09-25 DIAGNOSIS — Z9981 Dependence on supplemental oxygen: Secondary | ICD-10-CM | POA: Diagnosis not present

## 2014-09-25 DIAGNOSIS — Z79899 Other long term (current) drug therapy: Secondary | ICD-10-CM | POA: Diagnosis not present

## 2014-09-25 DIAGNOSIS — R634 Abnormal weight loss: Secondary | ICD-10-CM | POA: Diagnosis present

## 2014-09-25 DIAGNOSIS — R1312 Dysphagia, oropharyngeal phase: Secondary | ICD-10-CM | POA: Diagnosis not present

## 2014-09-25 DIAGNOSIS — S72002A Fracture of unspecified part of neck of left femur, initial encounter for closed fracture: Secondary | ICD-10-CM | POA: Diagnosis not present

## 2014-09-25 DIAGNOSIS — J439 Emphysema, unspecified: Secondary | ICD-10-CM | POA: Diagnosis not present

## 2014-09-25 DIAGNOSIS — G309 Alzheimer's disease, unspecified: Secondary | ICD-10-CM | POA: Diagnosis not present

## 2014-09-25 DIAGNOSIS — Y92129 Unspecified place in nursing home as the place of occurrence of the external cause: Secondary | ICD-10-CM

## 2014-09-25 DIAGNOSIS — R261 Paralytic gait: Secondary | ICD-10-CM | POA: Diagnosis not present

## 2014-09-25 DIAGNOSIS — W19XXXA Unspecified fall, initial encounter: Secondary | ICD-10-CM | POA: Diagnosis present

## 2014-09-25 DIAGNOSIS — R1313 Dysphagia, pharyngeal phase: Secondary | ICD-10-CM | POA: Diagnosis not present

## 2014-09-25 DIAGNOSIS — R2689 Other abnormalities of gait and mobility: Secondary | ICD-10-CM | POA: Diagnosis not present

## 2014-09-25 DIAGNOSIS — I2510 Atherosclerotic heart disease of native coronary artery without angina pectoris: Secondary | ICD-10-CM | POA: Diagnosis present

## 2014-09-25 DIAGNOSIS — I255 Ischemic cardiomyopathy: Secondary | ICD-10-CM | POA: Diagnosis present

## 2014-09-25 DIAGNOSIS — S72009A Fracture of unspecified part of neck of unspecified femur, initial encounter for closed fracture: Secondary | ICD-10-CM | POA: Diagnosis present

## 2014-09-25 DIAGNOSIS — I272 Other secondary pulmonary hypertension: Secondary | ICD-10-CM | POA: Diagnosis not present

## 2014-09-25 DIAGNOSIS — G9341 Metabolic encephalopathy: Secondary | ICD-10-CM | POA: Diagnosis not present

## 2014-09-25 DIAGNOSIS — M62838 Other muscle spasm: Secondary | ICD-10-CM | POA: Diagnosis present

## 2014-09-25 DIAGNOSIS — R293 Abnormal posture: Secondary | ICD-10-CM | POA: Diagnosis not present

## 2014-09-25 DIAGNOSIS — M6248 Contracture of muscle, other site: Secondary | ICD-10-CM | POA: Diagnosis not present

## 2014-09-25 DIAGNOSIS — I482 Chronic atrial fibrillation: Secondary | ICD-10-CM | POA: Diagnosis present

## 2014-09-25 DIAGNOSIS — F05 Delirium due to known physiological condition: Secondary | ICD-10-CM | POA: Diagnosis not present

## 2014-09-25 DIAGNOSIS — H3530 Unspecified macular degeneration: Secondary | ICD-10-CM | POA: Diagnosis present

## 2014-09-25 DIAGNOSIS — M6281 Muscle weakness (generalized): Secondary | ICD-10-CM | POA: Diagnosis not present

## 2014-09-25 DIAGNOSIS — S72002D Fracture of unspecified part of neck of left femur, subsequent encounter for closed fracture with routine healing: Secondary | ICD-10-CM | POA: Diagnosis not present

## 2014-09-25 DIAGNOSIS — R269 Unspecified abnormalities of gait and mobility: Secondary | ICD-10-CM | POA: Diagnosis not present

## 2014-09-25 DIAGNOSIS — R1011 Right upper quadrant pain: Secondary | ICD-10-CM

## 2014-09-25 DIAGNOSIS — Z951 Presence of aortocoronary bypass graft: Secondary | ICD-10-CM

## 2014-09-25 DIAGNOSIS — I4891 Unspecified atrial fibrillation: Secondary | ICD-10-CM | POA: Diagnosis not present

## 2014-09-25 DIAGNOSIS — F0390 Unspecified dementia without behavioral disturbance: Secondary | ICD-10-CM | POA: Diagnosis not present

## 2014-09-25 DIAGNOSIS — K219 Gastro-esophageal reflux disease without esophagitis: Secondary | ICD-10-CM | POA: Diagnosis not present

## 2014-09-25 DIAGNOSIS — Z7982 Long term (current) use of aspirin: Secondary | ICD-10-CM

## 2014-09-25 DIAGNOSIS — R279 Unspecified lack of coordination: Secondary | ICD-10-CM | POA: Diagnosis not present

## 2014-09-25 DIAGNOSIS — Z09 Encounter for follow-up examination after completed treatment for conditions other than malignant neoplasm: Secondary | ICD-10-CM

## 2014-09-25 DIAGNOSIS — R0602 Shortness of breath: Secondary | ICD-10-CM

## 2014-09-25 DIAGNOSIS — M25552 Pain in left hip: Secondary | ICD-10-CM | POA: Diagnosis present

## 2014-09-25 LAB — CBC WITH DIFFERENTIAL/PLATELET
Basophils Absolute: 0 10*3/uL (ref 0.0–0.1)
Basophils Relative: 0 % (ref 0–1)
Eosinophils Absolute: 0 10*3/uL (ref 0.0–0.7)
Eosinophils Relative: 0 % (ref 0–5)
HEMATOCRIT: 41.6 % (ref 36.0–46.0)
Hemoglobin: 13.6 g/dL (ref 12.0–15.0)
Lymphocytes Relative: 7 % — ABNORMAL LOW (ref 12–46)
Lymphs Abs: 0.8 10*3/uL (ref 0.7–4.0)
MCH: 29.6 pg (ref 26.0–34.0)
MCHC: 32.7 g/dL (ref 30.0–36.0)
MCV: 90.6 fL (ref 78.0–100.0)
MONOS PCT: 6 % (ref 3–12)
Monocytes Absolute: 0.8 10*3/uL (ref 0.1–1.0)
NEUTROS PCT: 87 % — AB (ref 43–77)
Neutro Abs: 10.5 10*3/uL — ABNORMAL HIGH (ref 1.7–7.7)
Platelets: 288 10*3/uL (ref 150–400)
RBC: 4.59 MIL/uL (ref 3.87–5.11)
RDW: 13.6 % (ref 11.5–15.5)
WBC: 12.2 10*3/uL — ABNORMAL HIGH (ref 4.0–10.5)

## 2014-09-25 LAB — CBC
HCT: 41.6 % (ref 36.0–46.0)
Hemoglobin: 13.8 g/dL (ref 12.0–15.0)
MCH: 30 pg (ref 26.0–34.0)
MCHC: 33.2 g/dL (ref 30.0–36.0)
MCV: 90.4 fL (ref 78.0–100.0)
Platelets: 290 10*3/uL (ref 150–400)
RBC: 4.6 MIL/uL (ref 3.87–5.11)
RDW: 13.4 % (ref 11.5–15.5)
WBC: 11.2 10*3/uL — ABNORMAL HIGH (ref 4.0–10.5)

## 2014-09-25 LAB — URINALYSIS, ROUTINE W REFLEX MICROSCOPIC
Bilirubin Urine: NEGATIVE
GLUCOSE, UA: NEGATIVE mg/dL
HGB URINE DIPSTICK: NEGATIVE
Ketones, ur: 40 mg/dL — AB
Leukocytes, UA: NEGATIVE
Nitrite: NEGATIVE
Protein, ur: NEGATIVE mg/dL
Specific Gravity, Urine: 1.024 (ref 1.005–1.030)
Urobilinogen, UA: 0.2 mg/dL (ref 0.0–1.0)
pH: 6 (ref 5.0–8.0)

## 2014-09-25 LAB — BASIC METABOLIC PANEL
ANION GAP: 10 (ref 5–15)
BUN: 17 mg/dL (ref 6–20)
CO2: 26 mmol/L (ref 22–32)
Calcium: 9 mg/dL (ref 8.9–10.3)
Chloride: 96 mmol/L — ABNORMAL LOW (ref 101–111)
Creatinine, Ser: 0.62 mg/dL (ref 0.44–1.00)
GFR calc Af Amer: >60 mL/min (ref >60)
GFR calc non Af Amer: >60 mL/min (ref >60)
Glucose, Bld: 148 mg/dL — ABNORMAL HIGH (ref 65–99)
Potassium: 4.1 mmol/L (ref 3.5–5.1)
SODIUM: 132 mmol/L — AB (ref 135–145)

## 2014-09-25 LAB — MAGNESIUM: Magnesium: 1.8 mg/dL (ref 1.7–2.4)

## 2014-09-25 LAB — TSH: TSH: 0.914 u[IU]/mL (ref 0.350–4.500)

## 2014-09-25 LAB — CREATININE, SERUM
CREATININE: 0.7 mg/dL (ref 0.44–1.00)
GFR calc Af Amer: >60 mL/min (ref >60)

## 2014-09-25 LAB — PROTIME-INR
INR: 1.19 (ref 0.00–1.49)
PROTHROMBIN TIME: 15.3 s — AB (ref 11.6–15.2)

## 2014-09-25 LAB — MRSA PCR SCREENING: MRSA by PCR: NEGATIVE

## 2014-09-25 LAB — TROPONIN I: TROPONIN I: 0.03 ng/mL (ref <0.031)

## 2014-09-25 MED ORDER — ACETAMINOPHEN 325 MG PO TABS: 650.0000 mg | ORAL_TABLET | Freq: Four times a day (QID) | ORAL | Status: DC | PRN

## 2014-09-25 MED ORDER — SODIUM CHLORIDE 0.9 % IV SOLN
20.0000 mL | INTRAVENOUS | Status: DC
  Administered 2014-09-25: 11:00:00 via INTRAVENOUS
  Administered 2014-09-28: 20 mL via INTRAVENOUS

## 2014-09-25 MED ORDER — DILTIAZEM HCL 30 MG PO TABS
30.0000 mg | ORAL_TABLET | Freq: Four times a day (QID) | ORAL | Status: DC
  Administered 2014-09-25 – 2014-09-26 (×3): 30 mg via ORAL
  Filled 2014-09-25 (×7): qty 1

## 2014-09-25 MED ORDER — DOCUSATE SODIUM 100 MG PO CAPS
100.0000 mg | ORAL_CAPSULE | Freq: Two times a day (BID) | ORAL | Status: DC
  Administered 2014-09-25 – 2014-10-03 (×15): 100 mg via ORAL
  Filled 2014-09-25 (×18): qty 1

## 2014-09-25 MED ORDER — DEXTROSE 5 % IV SOLN
5.0000 mg/h | Freq: Once | INTRAVENOUS | Status: AC
  Administered 2014-09-25: 5 mg/h via INTRAVENOUS

## 2014-09-25 MED ORDER — DILTIAZEM HCL 25 MG/5ML IV SOLN
10.0000 mg | Freq: Once | INTRAVENOUS | Status: AC
  Administered 2014-09-25: 10 mg via INTRAVENOUS
  Filled 2014-09-25: qty 5

## 2014-09-25 MED ORDER — ALPRAZOLAM 0.25 MG PO TABS
0.2500 mg | ORAL_TABLET | Freq: Every day | ORAL | Status: DC
  Administered 2014-09-25 – 2014-10-02 (×8): 0.25 mg via ORAL
  Filled 2014-09-25 (×8): qty 1

## 2014-09-25 MED ORDER — LEVALBUTEROL HCL 0.63 MG/3ML IN NEBU: 0.6300 mg | INHALATION_SOLUTION | Freq: Four times a day (QID) | RESPIRATORY_TRACT | Status: DC | PRN

## 2014-09-25 MED ORDER — MEMANTINE HCL 10 MG PO TABS
10.0000 mg | ORAL_TABLET | Freq: Every day | ORAL | Status: DC
  Administered 2014-09-25 – 2014-10-03 (×8): 10 mg via ORAL
  Filled 2014-09-25 (×9): qty 1

## 2014-09-25 MED ORDER — ASPIRIN EC 325 MG PO TBEC
325.0000 mg | DELAYED_RELEASE_TABLET | Freq: Every day | ORAL | Status: DC
  Administered 2014-09-25: 325 mg via ORAL
  Filled 2014-09-25 (×2): qty 1

## 2014-09-25 MED ORDER — FENTANYL CITRATE (PF) 100 MCG/2ML IJ SOLN
100.0000 ug | Freq: Once | INTRAMUSCULAR | Status: AC
  Administered 2014-09-25: 100 ug via INTRAVENOUS
  Filled 2014-09-25: qty 2

## 2014-09-25 MED ORDER — SODIUM CHLORIDE 0.9 % IV SOLN
INTRAVENOUS | Status: AC
  Administered 2014-09-25: 18:00:00 via INTRAVENOUS

## 2014-09-25 MED ORDER — ENOXAPARIN SODIUM 40 MG/0.4ML ~~LOC~~ SOLN
40.0000 mg | SUBCUTANEOUS | Status: DC
  Administered 2014-09-25 – 2014-10-02 (×7): 40 mg via SUBCUTANEOUS
  Filled 2014-09-25 (×10): qty 0.4

## 2014-09-25 MED ORDER — PRAVASTATIN SODIUM 20 MG PO TABS
20.0000 mg | ORAL_TABLET | Freq: Every day | ORAL | Status: DC
  Administered 2014-09-25 – 2014-09-27 (×2): 20 mg via ORAL
  Filled 2014-09-25 (×3): qty 1

## 2014-09-25 MED ORDER — ONDANSETRON HCL 4 MG PO TABS: 4.0000 mg | ORAL_TABLET | Freq: Four times a day (QID) | ORAL | Status: DC | PRN

## 2014-09-25 MED ORDER — POTASSIUM CHLORIDE CRYS ER 20 MEQ PO TBCR
20.0000 meq | EXTENDED_RELEASE_TABLET | Freq: Every day | ORAL | Status: DC
  Administered 2014-09-25 – 2014-09-29 (×4): 20 meq via ORAL
  Filled 2014-09-25 (×5): qty 1

## 2014-09-25 MED ORDER — PANTOPRAZOLE SODIUM 40 MG PO TBEC
40.0000 mg | DELAYED_RELEASE_TABLET | Freq: Every day | ORAL | Status: DC
  Administered 2014-09-25 – 2014-10-02 (×7): 40 mg via ORAL
  Filled 2014-09-25 (×7): qty 1

## 2014-09-25 MED ORDER — ACETAMINOPHEN 650 MG RE SUPP: 650.0000 mg | Freq: Four times a day (QID) | RECTAL | Status: DC | PRN

## 2014-09-25 MED ORDER — NADOLOL 80 MG PO TABS: 80.0000 mg | ORAL_TABLET | Freq: Every day | ORAL | Status: DC

## 2014-09-25 MED ORDER — SODIUM CHLORIDE 0.9 % IV SOLN: INTRAVENOUS | Status: AC

## 2014-09-25 MED ORDER — SODIUM CHLORIDE 0.9 % IJ SOLN
3.0000 mL | Freq: Two times a day (BID) | INTRAMUSCULAR | Status: DC
  Administered 2014-09-25 – 2014-10-03 (×12): 3 mL via INTRAVENOUS

## 2014-09-25 MED ORDER — CETYLPYRIDINIUM CHLORIDE 0.05 % MT LIQD
7.0000 mL | Freq: Two times a day (BID) | OROMUCOSAL | Status: DC
  Administered 2014-09-25 – 2014-10-03 (×16): 7 mL via OROMUCOSAL

## 2014-09-25 MED ORDER — ONDANSETRON HCL 4 MG/2ML IJ SOLN
4.0000 mg | Freq: Four times a day (QID) | INTRAMUSCULAR | Status: DC | PRN
Start: 2014-09-25 — End: 2014-09-26

## 2014-09-25 MED ORDER — OXYCODONE HCL 5 MG PO TABS
5.0000 mg | ORAL_TABLET | ORAL | Status: DC | PRN
  Administered 2014-09-25 – 2014-10-01 (×9): 5 mg via ORAL
  Filled 2014-09-25 (×9): qty 1

## 2014-09-25 MED ORDER — SERTRALINE HCL 50 MG PO TABS
50.0000 mg | ORAL_TABLET | Freq: Every day | ORAL | Status: DC
Start: 2014-09-25 — End: 2014-10-03
  Administered 2014-09-25 – 2014-10-03 (×8): 50 mg via ORAL
  Filled 2014-09-25 (×9): qty 1

## 2014-09-25 MED ORDER — LOSARTAN POTASSIUM 50 MG PO TABS
50.0000 mg | ORAL_TABLET | Freq: Every day | ORAL | Status: DC
  Administered 2014-09-25: 50 mg via ORAL
  Filled 2014-09-25 (×3): qty 1

## 2014-09-25 MED ORDER — ENSURE ENLIVE PO LIQD
237.0000 mL | Freq: Two times a day (BID) | ORAL | Status: DC
  Administered 2014-09-27 – 2014-10-02 (×9): 237 mL via ORAL

## 2014-09-25 NOTE — ED Notes (Signed)
Report given to Sabino Gasser  at [facility] in [facility] . All question answered.

## 2014-09-25 NOTE — ED Notes (Signed)
MD at bedside. 

## 2014-09-25 NOTE — Treatment Plan (Signed)
Discussed case with Dr. Ethelda Chick. Pt with hx of afib, COPD, CAD, HTN presents to ED with L hip pain. Found to have displaced, non-comminuted fracture of L femoral neck on imaging. Orthopedic Surgery consulted with recs to admit to [facility] to hospitalist service.  Ppt noted to be in afib RVR, improved with cardizem gtt.  Accepted to med-tele at [facility]

## 2014-09-25 NOTE — Consult Note (Signed)
Reason for Consult:left displaced femoral neck fracture Referring Physician:   [facility] ED MD  Ellen [name] is an 81 y.o. female.  HPI: 81 yo female with dementia stays in assisted living. Was in [facility] , over each of last years time she has progressively needed the next increase level of care.  Daughter at bedside, POA. Daughter states she ambulates with walker on her own. Sundowns almost every night. Had a fall 7 days ago, found on floor. Was able to walk all week , no other falls. Last 24 hrs hip short and externally rotated with apparent pain attempting to ambulate.  Has lost weight more than 20 lbs with dementia.   Past Medical History  Diagnosis Date  . Chronic airway obstruction, not elsewhere classified   . Allergic rhinitis, cause unspecified   . Esophageal reflux   . Iron deficiency anemia, unspecified   . Coronary atherosclerosis of unspecified type of vessel, native or graft   . Dysfunction of eustachian tube   . Obesity, unspecified   . Macular degeneration, right eye Being treated.  . Macular degeneration, left eye Blind     Past Surgical History  Procedure Laterality Date  . Coronary artery bypass graft      Family History  Problem Relation Age of Onset  . Colon cancer Brother   . Breast cancer Sister   . Heart disease Father   . Hypertension Father   . Heart disease Mother   . Hypertension Mother     Social History:  reports that she has never smoked. She has never used smokeless tobacco. Her alcohol and drug histories are not on file.  Allergies:  Allergies  Allergen Reactions  . Doxycycline Other (See Comments)    Destroyed good bacteria in GI tract  . Clarithromycin Other (See Comments)    H/A ....patient unsure of reaction    . Prinivil [Lisinopril] Other (See Comments)    Patient unsure of reaction  . Propulsid [Cisapride] Other (See Comments)    Patient unsure of reaction    Medications: I have reviewed the patient's current  medications.  Results for orders placed or performed during the hospital encounter of 09/25/14 (from the past 48 hour(s))  Basic metabolic panel     Status: Abnormal   Collection Time: 09/25/14 10:50 AM  Result Value Ref Range   Sodium 132 (L) 135 - 145 mmol/L   Potassium 4.1 3.5 - 5.1 mmol/L   Chloride 96 (L) 101 - 111 mmol/L   CO2 26 22 - 32 mmol/L   Glucose, Bld 148 (H) 65 - 99 mg/dL   BUN 17 6 - 20 mg/dL   Creatinine, Ser 0.62 0.44 - 1.00 mg/dL   Calcium 9.0 8.9 - 10.3 mg/dL   GFR calc non Af Amer >60 >60 mL/min   GFR calc Af Amer >60 >60 mL/min    Comment: (NOTE) The eGFR has been calculated using the CKD EPI equation. This calculation has not been validated in all clinical situations. eGFR's persistently <60 mL/min signify possible Chronic Kidney Disease.    Anion gap 10 5 - 15  CBC WITH DIFFERENTIAL     Status: Abnormal   Collection Time: 09/25/14 10:50 AM  Result Value Ref Range   WBC 12.2 (H) 4.0 - 10.5 K/uL   RBC 4.59 3.87 - 5.11 MIL/uL   Hemoglobin 13.6 12.0 - 15.0 g/dL   HCT 41.6 36.0 - 46.0 %   MCV 90.6 78.0 - 100.0 fL   MCH 29.6 26.0 -   34.0 pg   MCHC 32.7 30.0 - 36.0 g/dL   RDW 13.6 11.5 - 15.5 %   Platelets 288 150 - 400 K/uL   Neutrophils Relative % 87 (H) 43 - 77 %   Neutro Abs 10.5 (H) 1.7 - 7.7 K/uL   Lymphocytes Relative 7 (L) 12 - 46 %   Lymphs Abs 0.8 0.7 - 4.0 K/uL   Monocytes Relative 6 3 - 12 %   Monocytes Absolute 0.8 0.1 - 1.0 K/uL   Eosinophils Relative 0 0 - 5 %   Eosinophils Absolute 0.0 0.0 - 0.7 K/uL   Basophils Relative 0 0 - 1 %   Basophils Absolute 0.0 0.0 - 0.1 K/uL  Protime-INR     Status: Abnormal   Collection Time: 09/25/14 10:50 AM  Result Value Ref Range   Prothrombin Time 15.3 (H) 11.6 - 15.2 seconds   INR 1.19 0.00 - 1.49  Urinalysis, Routine w reflex microscopic (not at [facility])     Status: Abnormal   Collection Time: 09/25/14 11:23 AM  Result Value Ref Range   Color, Urine AMBER (A) YELLOW    Comment: BIOCHEMICALS MAY BE  AFFECTED BY COLOR   APPearance CLEAR CLEAR   Specific Gravity, Urine 1.024 1.005 - 1.030   pH 6.0 5.0 - 8.0   Glucose, UA NEGATIVE NEGATIVE mg/dL   Hgb urine dipstick NEGATIVE NEGATIVE   Bilirubin Urine NEGATIVE NEGATIVE   Ketones, ur 40 (A) NEGATIVE mg/dL   Protein, ur NEGATIVE NEGATIVE mg/dL   Urobilinogen, UA 0.2 0.0 - 1.0 mg/dL   Nitrite NEGATIVE NEGATIVE   Leukocytes, UA NEGATIVE NEGATIVE    Comment: MICROSCOPIC NOT DONE ON URINES WITH NEGATIVE PROTEIN, BLOOD, LEUKOCYTES, NITRITE, OR GLUCOSE <1000 mg/dL.    Dg Chest 1 View  09/25/2014   CLINICAL DATA:  Pt states she fell at nursing home 2 days ago. States chronic sob. Denies chest pain. Hx of cabg.  EXAM: CHEST  1 VIEW  COMPARISON:  09/06/2013  FINDINGS: Changes from CABG surgery are stable. Cardiac silhouette is mildly enlarged. No mediastinal or hilar masses or convincing adenopathy.  Lungs show areas of reticular nodular opacity reflecting dilated partly mucous filled bronchi. These findings are stable. There is elevation of left hemidiaphragm, also stable.  No acute lung consolidation and no evidence of pulmonary edema. No pleural effusion or pneumothorax.  Bony thorax is demineralized but grossly intact.  IMPRESSION: 1. No acute cardiopulmonary disease.   Electronically Signed   By: David  Ormond M.D.   On: 09/25/2014 10:32   Dg Hip Unilat With Pelvis 2-3 Views Left  09/25/2014   CLINICAL DATA:  Fell 2 days ago at the nursing home. Left hip pain since.  EXAM: DG HIP (WITH OR WITHOUT PELVIS) 2-3V LEFT  COMPARISON:  None.  FINDINGS: There is a displaced, non comminuted fracture of the left femoral neck. This is in mid cervical femoral neck fracture. Shaft component has retracted superiorly displacing 15 mm. There is also apex anterior angulation of approximately 30 degrees as well as significant varus angulation.  There are no other fractures. Bones are diffusely demineralized. There is superior lateral hip joint space narrowing on the left.   Soft tissues demonstrate vascular calcifications but are otherwise unremarkable.  IMPRESSION: 1. Displaced, non comminuted fracture of the left femoral neck with varus and apex anterior angulation.   Electronically Signed   By: David  Ormond M.D.   On: 09/25/2014 10:29    Review of Systems  Unable to perform ROS:   dementia   Blood pressure 137/65, pulse 93, temperature 98.4 F (36.9 C), temperature source Oral, resp. rate 23, weight 68.04 kg (150 lb), SpO2 97 %. Physical Exam  Constitutional: She is oriented to person, place, and time. She appears well-developed. No distress.  Sleeping, sitting up in bed.   HENT:  Head: Normocephalic and atraumatic.  Eyes: Pupils are equal, round, and reactive to light.  Neck: Normal range of motion.  Cardiovascular: Normal rate and regular rhythm.   Respiratory: Effort normal. She has no wheezes.  GI: Soft. Bowel sounds are normal.  Musculoskeletal:  Left LE short and external rotated.   Neurological: She is alert and oriented to person, place, and time.  Skin: Skin is warm and dry.  Psychiatric:  Dementia, disoriented.     Assessment/Plan: Displaced left femoral neck fracture.    Will post for surgery Monday afternoon in anticipation of clearance for surgery. Hemiarthroplasty procedure. Discussed with daughter planned procedure, risks. All ?'s answered . Risks of dislocation, fracture of femur, infection, PE, MI, CVA and death discussed.  Surgery time approx  30 minutes.     Xavious Sharrar C 09/25/2014, 3:57 PM      

## 2014-09-25 NOTE — ED Provider Notes (Addendum)
CSN: [id number]     Arrival date & time 09/25/14  4098 History   First MD Initiated Contact with Patient 09/25/14 1023     Chief Complaint  Patient presents with  . Hip Pain     (Consider location/radiation/quality/duration/timing/severity/associated sxs/prior Treatment) HPI Level V caveat --dementia history is obtained from Martyn Malay, med technician at assisted-living facility and from patient's daughter. Patient complained of left hip pain since yesterday. There is no known fall. She was also noted to have bruising at her right distal forearm yesterday. And complained of right pain yesterday. There is no fall. No recent trauma. Patient presently complains of left hip pain only. Denies other complaint. No treatment prior to coming here. Brought by EMS. Past Medical History  Diagnosis Date  . Chronic airway obstruction, not elsewhere classified   . Allergic rhinitis, cause unspecified   . Esophageal reflux   . Iron deficiency anemia, unspecified   . Coronary atherosclerosis of unspecified type of vessel, native or graft   . Dysfunction of eustachian tube   . Obesity, unspecified   . Macular degeneration, right eye Being treated.  . Macular degeneration, left eye Blind    dementia, atrial fibrillation Past Surgical History  Procedure Laterality Date  . Coronary artery bypass graft     Family History  Problem Relation Age of Onset  . Colon cancer Brother   . Breast cancer Sister   . Heart disease Father   . Hypertension Father   . Heart disease Mother   . Hypertension Mother    History  Substance Use Topics  . Smoking status: Never Smoker   . Smokeless tobacco: Never Used     Comment: Never used tobacco  . Alcohol Use: Not on file   no alcohol no drugs OB History    No data available     Review of Systems  Unable to perform ROS: Dementia  Eyes: Positive for visual disturbance.       Blind in left eye in left eye  Musculoskeletal: Positive for arthralgias and gait  problem.       Walks with walker      Allergies  Doxycycline; Clarithromycin; Prinivil; and Propulsid  Home Medications   Prior to Admission medications   Medication Sig Start Date End Date Taking? Authorizing Provider  ALPRAZolam (XANAX) 0.25 MG tablet Take 0.25 mg by mouth at bedtime.     Historical Provider, MD  aspirin EC 325 MG tablet Take 1 tablet (325 mg total) by mouth daily. 05/05/14   Dwana Melena, PA-C  Calcium-Vitamin D-Vitamin K (CALCIUM SOFT CHEWS PO) Take 2 tablets by mouth daily.    Historical Provider, MD  cholecalciferol (VITAMIN D) 1000 UNITS tablet Take 2,000 Units by mouth daily.    Historical Provider, MD  esomeprazole (NEXIUM) 40 MG capsule Take 40 mg by mouth daily.     Historical Provider, MD  furosemide (LASIX) 20 MG tablet Take 1 tablet (20 mg total) by mouth daily. 05/06/14   Lyn Records, MD  losartan (COZAAR) 50 MG tablet Take 50 mg by mouth daily.      Historical Provider, MD  memantine (NAMENDA) 10 MG tablet Take 10 mg by mouth at bedtime as needed.    Historical Provider, MD  Multiple Vitamins-Minerals (EYE VITAMINS) CAPS Take 1 capsule by mouth daily.     Historical Provider, MD  nadolol (CORGARD) 80 MG tablet Take 80 mg by mouth at bedtime.     Historical Provider, MD  NITROSTAT 0.4  MG SL tablet Place 0.4 mg under the tongue every 5 (five) minutes as needed for chest pain. Use as directed as needed 11/29/12   Historical Provider, MD  potassium chloride SA (KLOR-CON M20) 20 MEQ tablet Take 1 tablet (20 mEq total) by mouth daily. 05/05/14   Dwana Melena, PA-C  pravastatin (PRAVACHOL) 20 MG tablet Take 20 mg by mouth at bedtime as needed.    Historical Provider, MD  sertraline (ZOLOFT) 50 MG tablet Take 50 mg by mouth daily.    Historical Provider, MD  traMADol (ULTRAM) 50 MG tablet Take 50 mg by mouth every 6 (six) hours as needed.    Historical Provider, MD   BP 180/94 mmHg  Pulse 132  Temp(Src) 98.5 F (36.9 C)  Resp 18  Wt 150 lb (68.04 kg)  SpO2  99% Physical Exam  Constitutional: She appears well-developed and well-nourished. She appears distressed.  Appears mildly uncomfortable  HENT:  Head: Normocephalic and atraumatic.  Eyes: EOM are normal.  Neck: Neck supple. No tracheal deviation present. No thyromegaly present.  Cardiovascular: Normal rate.   Tachycardic irregularly irregular  Pulmonary/Chest: Effort normal and breath sounds normal.  Abdominal: Soft. Bowel sounds are normal. She exhibits no distension. There is no tenderness.  Musculoskeletal: Normal range of motion. She exhibits no edema or tenderness.  Entire spine nontender. Pelvis stable. Left lower extremity shortened and externally rotated. DP pulse 2+. There is tenderness at the hip. All other extremities without contusion abrasion or tenderness neurovascularly intact  Neurological: She is alert. Coordination normal.  Skin: Skin is warm and dry. No rash noted.  Psychiatric: She has a normal mood and affect.  Nursing note and vitals reviewed.   ED Course  Procedures (including critical care time) Labs Review Labs Reviewed  BASIC METABOLIC PANEL  CBC WITH DIFFERENTIAL/PLATELET  PROTIME-INR  TYPE AND SCREEN    Imaging Review Dg Chest 1 View  09/25/2014   CLINICAL DATA:  Pt states she fell at nursing home 2 days ago. States chronic sob. Denies chest pain. Hx of cabg.  EXAM: CHEST  1 VIEW  COMPARISON:  09/06/2013  FINDINGS: Changes from CABG surgery are stable. Cardiac silhouette is mildly enlarged. No mediastinal or hilar masses or convincing adenopathy.  Lungs show areas of reticular nodular opacity reflecting dilated partly mucous filled bronchi. These findings are stable. There is elevation of left hemidiaphragm, also stable.  No acute lung consolidation and no evidence of pulmonary edema. No pleural effusion or pneumothorax.  Bony thorax is demineralized but grossly intact.  IMPRESSION: 1. No acute cardiopulmonary disease.   Electronically Signed   By: Amie Portland M.D.   On: 09/25/2014 10:32   Dg Hip Unilat With Pelvis 2-3 Views Left  09/25/2014   CLINICAL DATA:  Larey Seat 2 days ago at the nursing home. Left hip pain since.  EXAM: DG HIP (WITH OR WITHOUT PELVIS) 2-3V LEFT  COMPARISON:  None.  FINDINGS: There is a displaced, non comminuted fracture of the left femoral neck. This is in mid cervical femoral neck fracture. Shaft component has retracted superiorly displacing 15 mm. There is also apex anterior angulation of approximately 30 degrees as well as significant varus angulation.  There are no other fractures. Bones are diffusely demineralized. There is superior lateral hip joint space narrowing on the left.  Soft tissues demonstrate vascular calcifications but are otherwise unremarkable.  IMPRESSION: 1. Displaced, non comminuted fracture of the left femoral neck with varus and apex anterior angulation.   Electronically Signed  By: Amie Portland M.D.   On: 09/25/2014 10:29     EKG Interpretation   Date/Time:  Sunday September 25 2014 09:54:27 EDT Ventricular Rate:  129 PR Interval:    QRS Duration: 68 QT Interval:  318 QTC Calculation: 465 R Axis:   43 Text Interpretation:  Atrial fibrillation with rapid ventricular response  Abnormal ECG SINCE LAST TRACING HEART RATE HAS INCREASED Confirmed by  Ethelda Chick  MD, Cherril Hett [id number]) on 09/25/2014 10:02:19 AM     x-rays viewed by me Results for orders placed or performed during the hospital encounter of 09/25/14  Basic metabolic panel  Result Value Ref Range   Sodium 132 (L) 135 - 145 mmol/L   Potassium 4.1 3.5 - 5.1 mmol/L   Chloride 96 (L) 101 - 111 mmol/L   CO2 26 22 - 32 mmol/L   Glucose, Bld 148 (H) 65 - 99 mg/dL   BUN 17 6 - 20 mg/dL   Creatinine, Ser 1.91 0.44 - 1.00 mg/dL   Calcium 9.0 8.9 - 47.8 mg/dL   GFR calc non Af Amer >60 >60 mL/min   GFR calc Af Amer >60 >60 mL/min   Anion gap 10 5 - 15  CBC WITH DIFFERENTIAL  Result Value Ref Range   WBC 12.2 (H) 4.0 - 10.5 K/uL   RBC 4.59 3.87 -  5.11 MIL/uL   Hemoglobin 13.6 12.0 - 15.0 g/dL   HCT 29.5 62.1 - 30.8 %   MCV 90.6 78.0 - 100.0 fL   MCH 29.6 26.0 - 34.0 pg   MCHC 32.7 30.0 - 36.0 g/dL   RDW 65.7 84.6 - 96.2 %   Platelets 288 150 - 400 K/uL   Neutrophils Relative % 87 (H) 43 - 77 %   Neutro Abs 10.5 (H) 1.7 - 7.7 K/uL   Lymphocytes Relative 7 (L) 12 - 46 %   Lymphs Abs 0.8 0.7 - 4.0 K/uL   Monocytes Relative 6 3 - 12 %   Monocytes Absolute 0.8 0.1 - 1.0 K/uL   Eosinophils Relative 0 0 - 5 %   Eosinophils Absolute 0.0 0.0 - 0.7 K/uL   Basophils Relative 0 0 - 1 %   Basophils Absolute 0.0 0.0 - 0.1 K/uL  Protime-INR  Result Value Ref Range   Prothrombin Time 15.3 (H) 11.6 - 15.2 seconds   INR 1.19 0.00 - 1.49  Urinalysis, Routine w reflex microscopic (not at [facility])  Result Value Ref Range   Color, Urine AMBER (A) YELLOW   APPearance CLEAR CLEAR   Specific Gravity, Urine 1.024 1.005 - 1.030   pH 6.0 5.0 - 8.0   Glucose, UA NEGATIVE NEGATIVE mg/dL   Hgb urine dipstick NEGATIVE NEGATIVE   Bilirubin Urine NEGATIVE NEGATIVE   Ketones, ur 40 (A) NEGATIVE mg/dL   Protein, ur NEGATIVE NEGATIVE mg/dL   Urobilinogen, UA 0.2 0.0 - 1.0 mg/dL   Nitrite NEGATIVE NEGATIVE   Leukocytes, UA NEGATIVE NEGATIVE   Dg Chest 1 View  09/25/2014   CLINICAL DATA:  Pt states she fell at nursing home 2 days ago. States chronic sob. Denies chest pain. Hx of cabg.  EXAM: CHEST  1 VIEW  COMPARISON:  09/06/2013  FINDINGS: Changes from CABG surgery are stable. Cardiac silhouette is mildly enlarged. No mediastinal or hilar masses or convincing adenopathy.  Lungs show areas of reticular nodular opacity reflecting dilated partly mucous filled bronchi. These findings are stable. There is elevation of left hemidiaphragm, also stable.  No acute lung consolidation and no  evidence of pulmonary edema. No pleural effusion or pneumothorax.  Bony thorax is demineralized but grossly intact.  IMPRESSION: 1. No acute cardiopulmonary disease.   Electronically  Signed   By: Amie Portland M.D.   On: 09/25/2014 10:32   Dg Thoracic Spine 2 View  09/18/2014   CLINICAL DATA:  Acute upper thoracic pain following fall today. Initial encounter.  EXAM: THORACIC SPINE 2 VIEWS  COMPARISON:  07/04/2014 and prior chest radiographs  FINDINGS: Normal alignment is noted.  There is no evidence of acute fracture or subluxation.  Mild multilevel degenerative disc disease identified.  No focal bony lesions are identified.  CABG changes are again noted.  IMPRESSION: No evidence of acute bony abnormality.   Electronically Signed   By: Harmon Pier M.D.   On: 09/18/2014 08:17   Dg Shoulder Right  09/18/2014   CLINICAL DATA:  Unwitnessed fall.  Shoulder pain  EXAM: RIGHT SHOULDER - 2+ VIEW  COMPARISON:  None.  FINDINGS: Glenohumeral joint is intact. No evidence of scapular fracture or humeral fracture. The acromioclavicular joint is intact.  IMPRESSION: No fracture or dislocation.   Electronically Signed   By: Genevive Bi M.D.   On: 09/18/2014 08:16   Ct Head Wo Contrast  09/18/2014   CLINICAL DATA:  Unwitnessed fall, now with posterior neck pain.  EXAM: CT HEAD WITHOUT CONTRAST  CT CERVICAL SPINE WITHOUT CONTRAST  TECHNIQUE: Multidetector CT imaging of the head and cervical spine was performed following the standard protocol without intravenous contrast. Multiplanar CT image reconstructions of the cervical spine were also generated.  COMPARISON:  Head CT - 05/18/2012  FINDINGS: CT HEAD FINDINGS  Examination is minimally degraded secondary to patient motion, necessitating the acquisition of additional images.  Similar findings of advanced atrophy with sulcal prominence and centralized volume loss with commensurate ex vacuo dilatation the ventricular system. Scattered periventricular hypodensities compatible with microvascular ischemic disease. Unchanged punctate calcification in the right basal ganglia. Given extensive background parenchymal abnormalities, there is no CT evidence of  superimposed acute large territory infarct. No intraparenchymal or extra-axial mass or hemorrhage. Unchanged size and configuration of the ventricles and basilar cisterns. No midline shift. Intracranial atherosclerosis.  Regional soft tissues appear normal. Post bilateral cataract surgery. Limited visualization the paranasal sinuses and mastoid air cells is normal. No air-fluid levels. No displaced calvarial fracture.  CT CERVICAL SPINE FINDINGS  C1 to the superior endplate of T3 is imaged.  There is minimal (approximately 1-2 mm) of anterolisthesis of C3 upon C4 and C7 upon T1. The dens is normally positioned between the lateral masses of C1. Normal atlantodental and atlantoaxial articulations. The bilateral facets are normally aligned.  No fracture or static subluxation of cervical spine. Cervical vertebral body heights are preserved. Prevertebral soft tissues are normal.  Mild to moderate multilevel cervical spine DDD, worse at C5-C6 and C6-C7 with disc space height loss, endplate irregularity and small posteriorly directed disc osteophyte complexes at these locations. There is partial ossification the nuchal ligament posterior to the C3 spinous process.  Atherosclerotic plaque within the bilateral carotid bulbs. There is mild diffuse heterogeneity of the thyroid parenchyma without discrete nodule on this noncontrast examination. No bulky cervical lymphadenopathy on this noncontrast examination.  Limited visualization of lung apices demonstrates ill-defined slightly nodular opacities within the imaged portions of the bilateral lung apices (representative images 75 and 83, series 7).  IMPRESSION: 1. Similar findings of advanced atrophy and microvascular ischemic disease without acute intracranial process. 2. No fracture or static subluxation of  the cervical spine. 3. Mild to moderate multilevel cervical spine DDD, worse at C5-C6 and C6-C7.   Electronically Signed   By: Simonne Come M.D.   On: 09/18/2014 08:13    Ct Cervical Spine Wo Contrast  09/18/2014   CLINICAL DATA:  Unwitnessed fall, now with posterior neck pain.  EXAM: CT HEAD WITHOUT CONTRAST  CT CERVICAL SPINE WITHOUT CONTRAST  TECHNIQUE: Multidetector CT imaging of the head and cervical spine was performed following the standard protocol without intravenous contrast. Multiplanar CT image reconstructions of the cervical spine were also generated.  COMPARISON:  Head CT - 05/18/2012  FINDINGS: CT HEAD FINDINGS  Examination is minimally degraded secondary to patient motion, necessitating the acquisition of additional images.  Similar findings of advanced atrophy with sulcal prominence and centralized volume loss with commensurate ex vacuo dilatation the ventricular system. Scattered periventricular hypodensities compatible with microvascular ischemic disease. Unchanged punctate calcification in the right basal ganglia. Given extensive background parenchymal abnormalities, there is no CT evidence of superimposed acute large territory infarct. No intraparenchymal or extra-axial mass or hemorrhage. Unchanged size and configuration of the ventricles and basilar cisterns. No midline shift. Intracranial atherosclerosis.  Regional soft tissues appear normal. Post bilateral cataract surgery. Limited visualization the paranasal sinuses and mastoid air cells is normal. No air-fluid levels. No displaced calvarial fracture.  CT CERVICAL SPINE FINDINGS  C1 to the superior endplate of T3 is imaged.  There is minimal (approximately 1-2 mm) of anterolisthesis of C3 upon C4 and C7 upon T1. The dens is normally positioned between the lateral masses of C1. Normal atlantodental and atlantoaxial articulations. The bilateral facets are normally aligned.  No fracture or static subluxation of cervical spine. Cervical vertebral body heights are preserved. Prevertebral soft tissues are normal.  Mild to moderate multilevel cervical spine DDD, worse at C5-C6 and C6-C7 with disc space height  loss, endplate irregularity and small posteriorly directed disc osteophyte complexes at these locations. There is partial ossification the nuchal ligament posterior to the C3 spinous process.  Atherosclerotic plaque within the bilateral carotid bulbs. There is mild diffuse heterogeneity of the thyroid parenchyma without discrete nodule on this noncontrast examination. No bulky cervical lymphadenopathy on this noncontrast examination.  Limited visualization of lung apices demonstrates ill-defined slightly nodular opacities within the imaged portions of the bilateral lung apices (representative images 75 and 83, series 7).  IMPRESSION: 1. Similar findings of advanced atrophy and microvascular ischemic disease without acute intracranial process. 2. No fracture or static subluxation of the cervical spine. 3. Mild to moderate multilevel cervical spine DDD, worse at C5-C6 and C6-C7.   Electronically Signed   By: Simonne Come M.D.   On: 09/18/2014 08:13   Dg Hip Unilat With Pelvis 2-3 Views Left  09/25/2014   CLINICAL DATA:  Larey Seat 2 days ago at the nursing home. Left hip pain since.  EXAM: DG HIP (WITH OR WITHOUT PELVIS) 2-3V LEFT  COMPARISON:  None.  FINDINGS: There is a displaced, non comminuted fracture of the left femoral neck. This is in mid cervical femoral neck fracture. Shaft component has retracted superiorly displacing 15 mm. There is also apex anterior angulation of approximately 30 degrees as well as significant varus angulation.  There are no other fractures. Bones are diffusely demineralized. There is superior lateral hip joint space narrowing on the left.  Soft tissues demonstrate vascular calcifications but are otherwise unremarkable.  IMPRESSION: 1. Displaced, non comminuted fracture of the left femoral neck with varus and apex anterior angulation.   Electronically  Signed   By: Amie Portland M.D.   On: 09/25/2014 10:29    11:40 AM pain is controlled after treatment with intravenous fentanyl. She  continues to be in atrial fibrillation with rapid ventricular response at 03/19/2018 40 bpm. Intravenous Cardizem ordered. 12 noon patient resting comfortably. Heart rate 108, atrial fibrillation. Cardizem intravenous drip ordered for rate control  MDM  Spoke with Dr.Yates who requests transfer to [facility], and admission to hospitalist service. He will operate today if she is cleared medically. If not he will operate tomorrow Final diagnoses:  None   spoke with Dr.Chiu plan transfer [facility] telemetry. Maintain nothing by mouth status until cleared. I spoke with patient's daughter. She is full code presently. Diagnoses #1 acute closed fracture of left femoral neck #2 atrial fibrillation with rapid ventricular response #3 hyperglycemia CRITICAL CARE Performed by: Doug Sou Total critical care time: 35 minute Critical care time was exclusive of separately billable procedures and treating other patients. Critical care was necessary to treat or prevent imminent or life-threatening deterioration. Critical care was time spent personally by me on the following activities: development of treatment plan with patient and/or surrogate as well as nursing, discussions with consultants, evaluation of patient's response to treatment, examination of patient, obtaining history from patient or surrogate, ordering and performing treatments and interventions, ordering and review of laboratory studies, ordering and review of radiographic studies, pulse oximetry and re-evaluation of patient's condition.    Doug Sou, MD 09/25/14 1215  Addendum telemetry floor at [facility] cannot accept intra venous Cardizem drip.. Dr. Rhona Leavens recontacted by me. She will be admitted to stepdown unit  Doug Sou, MD 09/25/14 1317  1:15 PM patient is resting comfortably, sleeping easily arousable. No distress  Doug Sou, MD 09/25/14 1319

## 2014-09-25 NOTE — H&P (Signed)
[facility] History and Physical  Ellen [name] ZOX:[id number] DOB: Oct 18, 1926 DOA: 09/25/2014  Referring physician:  PCP: [name], MD   Chief Complaint: Hip Pain  HPI:  81 year old female with a history of oxygen dependent COPD, allergic rhinitis, coronary artery disease, iron deficiency anemia, gastroesophageal reflux disease, seen in the ER on 7/31, after a fall at a memory care facility. She did not lose consciousness. According to the staff and EMS the patient was back to her baseline and was discharged back to the facility. She was brought back in by EMS today on 8/7 with complaints of left hip pain for 2 days. No recurrent falls since 7/31.She was also noted to have bruising at her right distal forearm yesterday. She has a history of atrial fibrillation not on anticoagulation and was found to be in rapid A. fib with a heart rate of 132 for which she was placed on a Cardizem drip. Patient transferred to [facility] for orthopedic consultation for displaced, non-comminuted fracture of L femoral neck on imaging.Daughter at bedside, POA. Daughter states she ambulates with walker on her own. Sundowns almost every night. Had a fall 7 days ago, found on floor. Was able to walk all week , no other falls. Last 24 hrs hip short and externally rotated with apparent pain attempting to ambulate. Has lost weight more than 20 lbs with dementia     Review of Systems  Unable to perform ROS: Dementia       Past Medical History  Diagnosis Date  . Chronic airway obstruction, not elsewhere classified   . Allergic rhinitis, cause unspecified   . Esophageal reflux   . Iron deficiency anemia, unspecified   . Coronary atherosclerosis of unspecified type of vessel, native or graft   . Dysfunction of eustachian tube   . Obesity, unspecified   . Macular degeneration, right eye Being treated.  . Macular degeneration, left eye Blind      Past Surgical History   Procedure Laterality Date  . Coronary artery bypass graft        Social History:  reports that she has never smoked. She has never used smokeless tobacco. Her alcohol and drug histories are not on file.    Allergies  Allergen Reactions  . Doxycycline Other (See Comments)    Destroyed good bacteria in GI tract  . Clarithromycin Other (See Comments)    H/A .[name]..patient unsure of reaction    . Prinivil [Lisinopril] Other (See Comments)    Patient unsure of reaction  . Propulsid [Cisapride] Other (See Comments)    Patient unsure of reaction    Family History  Problem Relation Age of Onset  . Colon cancer Brother   . Breast cancer Sister   . Heart disease Father   . Hypertension Father   . Heart disease Mother   . Hypertension Mother          Prior to Admission medications   Medication Sig Start Date End Date Taking? Authorizing Provider  ALPRAZolam (XANAX) 0.25 MG tablet Take 0.25 mg by mouth at bedtime.    Yes Historical Provider, MD  aspirin EC 325 MG tablet Take 1 tablet (325 mg total) by mouth daily. 05/05/14  Yes Dwana Melena, PA-C  Calcium-Vitamin D-Vitamin K (CALCIUM SOFT CHEWS PO) Take 2 tablets by mouth daily.   Yes Historical Provider, MD  cholecalciferol (VITAMIN D) 1000 UNITS tablet Take 2,000 Units by mouth daily.   Yes Historical Provider, MD  esomeprazole (NEXIUM) 40 MG  capsule Take 40 mg by mouth daily.    Yes Historical Provider, MD  furosemide (LASIX) 20 MG tablet Take 1 tablet (20 mg total) by mouth daily. 05/06/14  Yes Lyn Records, MD  losartan (COZAAR) 50 MG tablet Take 50 mg by mouth daily.     Yes Historical Provider, MD  memantine (NAMENDA) 10 MG tablet Take 10 mg by mouth at bedtime as needed.   Yes Historical Provider, MD  Multiple Vitamins-Minerals (EYE VITAMINS) CAPS Take 1 capsule by mouth daily.    Yes Historical Provider, MD  nadolol (CORGARD) 80 MG tablet Take 80 mg by mouth at bedtime.    Yes Historical Provider, MD  NITROSTAT 0.4 MG SL  tablet Place 0.4 mg under the tongue every 5 (five) minutes as needed for chest pain. Use as directed as needed 11/29/12  Yes Historical Provider, MD  potassium chloride SA (KLOR-CON M20) 20 MEQ tablet Take 1 tablet (20 mEq total) by mouth daily. 05/05/14  Yes Dwana Melena, PA-C  pravastatin (PRAVACHOL) 20 MG tablet Take 20 mg by mouth at bedtime as needed.   Yes Historical Provider, MD  sertraline (ZOLOFT) 50 MG tablet Take 50 mg by mouth daily.   Yes Historical Provider, MD  traMADol (ULTRAM) 50 MG tablet Take 50 mg by mouth every 6 (six) hours as needed.   Yes Historical Provider, MD     Physical Exam: Filed Vitals:   09/25/14 1330 09/25/14 1345 09/25/14 1400 09/25/14 1500  BP: 140/77 134/78 135/88 137/65  Pulse: 83 95 94 93  Temp:    98.4 F (36.9 C)  TempSrc:    Oral  Resp: 17 16 15 23   Weight:      SpO2: 99% 98% 98% 97%     Constitutional: Vital signs reviewed. Patient is a well-developed and well-nourished in no acute distress and cooperative with exam. She appears distressed.  Head: Normocephalic and atraumatic  Ear: TM normal bilaterally  Mouth: no erythema or exudates, MMM  Eyes: PERRL, EOMI, conjunctivae normal, No scleral icterus.  Neck: Supple, Trachea midline normal ROM, No JVD, mass, thyromegaly, or carotid bruit present.  Cardiovascular:Tachycardic irregularly irregular , S1 normal, S2 normal, no MRG, pulses symmetric and intact bilaterally  Pulmonary/Chest: CTAB, no wheezes, rales, or rhonchi  Abdominal: Soft. Non-tender, non-distended, bowel sounds are normal, no masses, organomegaly, or guarding present.  GU: no CVA tenderness Musculoskeletal: Normal range of motion. She exhibits no edema or tenderness.  Entire spine nontender. Pelvis stable. Left lower extremity shortened and externally rotated. DP pulse 2+. There is tenderness at the hip. All other extremities without contusion abrasion or tenderness neurovascularly intact  Hematology: no cervical, inginal, or  axillary adenopathy.  Neurological: A&O x3, Strenght is normal and symmetric bilaterally, cranial nerve II-XII are grossly intact, no focal motor deficit, sensory intact to light touch bilaterally.  Skin: Warm, dry and intact. No rash, cyanosis, or clubbing.  Psychiatric: Normal mood and affect. speech and behavior is normal. Judgment and thought content normal. Cognition and memory are normal.      Data Review   Micro Results No results found for this or any previous visit (from the past 240 hour(s)).  Radiology Reports Dg Chest 1 View  09/25/2014   CLINICAL DATA:  Pt states she fell at nursing home 2 days ago. States chronic sob. Denies chest pain. Hx of cabg.  EXAM: CHEST  1 VIEW  COMPARISON:  09/06/2013  FINDINGS: Changes from CABG surgery are stable. Cardiac silhouette is mildly enlarged.  No mediastinal or hilar masses or convincing adenopathy.  Lungs show areas of reticular nodular opacity reflecting dilated partly mucous filled bronchi. These findings are stable. There is elevation of left hemidiaphragm, also stable.  No acute lung consolidation and no evidence of pulmonary edema. No pleural effusion or pneumothorax.  Bony thorax is demineralized but grossly intact.  IMPRESSION: 1. No acute cardiopulmonary disease.   Electronically Signed   By: Amie Portland M.D.   On: 09/25/2014 10:32   Dg Thoracic Spine 2 View  09/18/2014   CLINICAL DATA:  Acute upper thoracic pain following fall today. Initial encounter.  EXAM: THORACIC SPINE 2 VIEWS  COMPARISON:  07/04/2014 and prior chest radiographs  FINDINGS: Normal alignment is noted.  There is no evidence of acute fracture or subluxation.  Mild multilevel degenerative disc disease identified.  No focal bony lesions are identified.  CABG changes are again noted.  IMPRESSION: No evidence of acute bony abnormality.   Electronically Signed   By: Harmon Pier M.D.   On: 09/18/2014 08:17   Dg Shoulder Right  09/18/2014   CLINICAL DATA:  Unwitnessed fall.   Shoulder pain  EXAM: RIGHT SHOULDER - 2+ VIEW  COMPARISON:  None.  FINDINGS: Glenohumeral joint is intact. No evidence of scapular fracture or humeral fracture. The acromioclavicular joint is intact.  IMPRESSION: No fracture or dislocation.   Electronically Signed   By: Genevive Bi M.D.   On: 09/18/2014 08:16   Ct Head Wo Contrast  09/18/2014   CLINICAL DATA:  Unwitnessed fall, now with posterior neck pain.  EXAM: CT HEAD WITHOUT CONTRAST  CT CERVICAL SPINE WITHOUT CONTRAST  TECHNIQUE: Multidetector CT imaging of the head and cervical spine was performed following the standard protocol without intravenous contrast. Multiplanar CT image reconstructions of the cervical spine were also generated.  COMPARISON:  Head CT - 05/18/2012  FINDINGS: CT HEAD FINDINGS  Examination is minimally degraded secondary to patient motion, necessitating the acquisition of additional images.  Similar findings of advanced atrophy with sulcal prominence and centralized volume loss with commensurate ex vacuo dilatation the ventricular system. Scattered periventricular hypodensities compatible with microvascular ischemic disease. Unchanged punctate calcification in the right basal ganglia. Given extensive background parenchymal abnormalities, there is no CT evidence of superimposed acute large territory infarct. No intraparenchymal or extra-axial mass or hemorrhage. Unchanged size and configuration of the ventricles and basilar cisterns. No midline shift. Intracranial atherosclerosis.  Regional soft tissues appear normal. Post bilateral cataract surgery. Limited visualization the paranasal sinuses and mastoid air cells is normal. No air-fluid levels. No displaced calvarial fracture.  CT CERVICAL SPINE FINDINGS  C1 to the superior endplate of T3 is imaged.  There is minimal (approximately 1-2 mm) of anterolisthesis of C3 upon C4 and C7 upon T1. The dens is normally positioned between the lateral masses of C1. Normal atlantodental and  atlantoaxial articulations. The bilateral facets are normally aligned.  No fracture or static subluxation of cervical spine. Cervical vertebral body heights are preserved. Prevertebral soft tissues are normal.  Mild to moderate multilevel cervical spine DDD, worse at C5-C6 and C6-C7 with disc space height loss, endplate irregularity and small posteriorly directed disc osteophyte complexes at these locations. There is partial ossification the nuchal ligament posterior to the C3 spinous process.  Atherosclerotic plaque within the bilateral carotid bulbs. There is mild diffuse heterogeneity of the thyroid parenchyma without discrete nodule on this noncontrast examination. No bulky cervical lymphadenopathy on this noncontrast examination.  Limited visualization of lung apices demonstrates ill-defined slightly  nodular opacities within the imaged portions of the bilateral lung apices (representative images 75 and 83, series 7).  IMPRESSION: 1. Similar findings of advanced atrophy and microvascular ischemic disease without acute intracranial process. 2. No fracture or static subluxation of the cervical spine. 3. Mild to moderate multilevel cervical spine DDD, worse at C5-C6 and C6-C7.   Electronically Signed   By: Simonne Come M.D.   On: 09/18/2014 08:13   Ct Cervical Spine Wo Contrast  09/18/2014   CLINICAL DATA:  Unwitnessed fall, now with posterior neck pain.  EXAM: CT HEAD WITHOUT CONTRAST  CT CERVICAL SPINE WITHOUT CONTRAST  TECHNIQUE: Multidetector CT imaging of the head and cervical spine was performed following the standard protocol without intravenous contrast. Multiplanar CT image reconstructions of the cervical spine were also generated.  COMPARISON:  Head CT - 05/18/2012  FINDINGS: CT HEAD FINDINGS  Examination is minimally degraded secondary to patient motion, necessitating the acquisition of additional images.  Similar findings of advanced atrophy with sulcal prominence and centralized volume loss with  commensurate ex vacuo dilatation the ventricular system. Scattered periventricular hypodensities compatible with microvascular ischemic disease. Unchanged punctate calcification in the right basal ganglia. Given extensive background parenchymal abnormalities, there is no CT evidence of superimposed acute large territory infarct. No intraparenchymal or extra-axial mass or hemorrhage. Unchanged size and configuration of the ventricles and basilar cisterns. No midline shift. Intracranial atherosclerosis.  Regional soft tissues appear normal. Post bilateral cataract surgery. Limited visualization the paranasal sinuses and mastoid air cells is normal. No air-fluid levels. No displaced calvarial fracture.  CT CERVICAL SPINE FINDINGS  C1 to the superior endplate of T3 is imaged.  There is minimal (approximately 1-2 mm) of anterolisthesis of C3 upon C4 and C7 upon T1. The dens is normally positioned between the lateral masses of C1. Normal atlantodental and atlantoaxial articulations. The bilateral facets are normally aligned.  No fracture or static subluxation of cervical spine. Cervical vertebral body heights are preserved. Prevertebral soft tissues are normal.  Mild to moderate multilevel cervical spine DDD, worse at C5-C6 and C6-C7 with disc space height loss, endplate irregularity and small posteriorly directed disc osteophyte complexes at these locations. There is partial ossification the nuchal ligament posterior to the C3 spinous process.  Atherosclerotic plaque within the bilateral carotid bulbs. There is mild diffuse heterogeneity of the thyroid parenchyma without discrete nodule on this noncontrast examination. No bulky cervical lymphadenopathy on this noncontrast examination.  Limited visualization of lung apices demonstrates ill-defined slightly nodular opacities within the imaged portions of the bilateral lung apices (representative images 75 and 83, series 7).  IMPRESSION: 1. Similar findings of advanced  atrophy and microvascular ischemic disease without acute intracranial process. 2. No fracture or static subluxation of the cervical spine. 3. Mild to moderate multilevel cervical spine DDD, worse at C5-C6 and C6-C7.   Electronically Signed   By: Simonne Come M.D.   On: 09/18/2014 08:13   Dg Hip Unilat With Pelvis 2-3 Views Left  09/25/2014   CLINICAL DATA:  Larey Seat 2 days ago at the nursing home. Left hip pain since.  EXAM: DG HIP (WITH OR WITHOUT PELVIS) 2-3V LEFT  COMPARISON:  None.  FINDINGS: There is a displaced, non comminuted fracture of the left femoral neck. This is in mid cervical femoral neck fracture. Shaft component has retracted superiorly displacing 15 mm. There is also apex anterior angulation of approximately 30 degrees as well as significant varus angulation.  There are no other fractures. Bones are diffusely demineralized. There  is superior lateral hip joint space narrowing on the left.  Soft tissues demonstrate vascular calcifications but are otherwise unremarkable.  IMPRESSION: 1. Displaced, non comminuted fracture of the left femoral neck with varus and apex anterior angulation.   Electronically Signed   By: Amie Portland M.D.   On: 09/25/2014 10:29     CBC  Recent Labs Lab 09/25/14 1050  WBC 12.2*  HGB 13.6  HCT 41.6  PLT 288  MCV 90.6  MCH 29.6  MCHC 32.7  RDW 13.6  LYMPHSABS 0.8  MONOABS 0.8  EOSABS 0.0  BASOSABS 0.0    Chemistries   Recent Labs Lab 09/25/14 1050  NA 132*  K 4.1  CL 96*  CO2 26  GLUCOSE 148*  BUN 17  CREATININE 0.62  CALCIUM 9.0   ------------------------------------------------------------------------------------------------------------------ estimated creatinine clearance is 44.5 mL/min (by C-G formula based on Cr of 0.62). ------------------------------------------------------------------------------------------------------------------ No results for input(s): HGBA1C in the last 72  hours. ------------------------------------------------------------------------------------------------------------------ No results for input(s): CHOL, HDL, LDLCALC, TRIG, CHOLHDL, LDLDIRECT in the last 72 hours. ------------------------------------------------------------------------------------------------------------------ No results for input(s): TSH, T4TOTAL, T3FREE, THYROIDAB in the last 72 hours.  Invalid input(s): FREET3 ------------------------------------------------------------------------------------------------------------------ No results for input(s): VITAMINB12, FOLATE, FERRITIN, TIBC, IRON, RETICCTPCT in the last 72 hours.  Coagulation profile  Recent Labs Lab 09/25/14 1050  INR 1.19    No results for input(s): DDIMER in the last 72 hours.  Cardiac Enzymes No results for input(s): CKMB, TROPONINI, MYOGLOBIN in the last 168 hours.  Invalid input(s): CK ------------------------------------------------------------------------------------------------------------------ Invalid input(s): POCBNP   CBG: No results for input(s): GLUCAP in the last 168 hours.     EKG: Independently reviewed.EKG Interpretation   Date/Time: Sunday September 25 2014 09:54:27 EDT Ventricular Rate: 129 PR Interval:  QRS Duration: 68 QT Interval: 318 QTC Calculation: 465 R Axis: 43 Text Interpretation: Atrial fibrillation with rapid ventricular response   Assessment/Plan   Atrial fibrillation with rapid ventricular response-continue Cardizem drip for now, titrate for heart rate less than 100, systolic blood pressure greater than 120, also start the patient on by mouth Cardizem, hold nadolol, currently not on anticoagulation given age and high fall risk, will check TSH, replete electrolytes, check magnesium level    Iron deficiency anemia-patient received aranesp at cancer center. Hemoglobin stable    Coronary atherosclerosis-continue telemetry, continue to cycle cardiac  enzymes, last seen by Dr. Verdis Prime and asked to follow prn     GERD-continue PPI    Essential hypertension, benign-continue IV and by mouth Cardizem, Cozaar, hold nadolol    Closed left hip fracture-patient seen by Dr. Eldred Manges, MD. Anticipated to have surgery tomorrow. She will require a hemiarthroplasty procedure. High perioperative risk given age, history of coronary artery disease, oxygen dependent COPD, atrial fibrillation. The risk of surgery was discussed with the daughter and she would like to proceed      Code Status:   full Family Communication: bedside Disposition Plan: admit   Total time spent 55 minutes.Greater than 50% of this time was spent in counseling, explanation of diagnosis, planning of further management, and coordination of care  [facility] Pager [phone]  If 7PM-7AM, please contact night-coverage [email or web address] Password [facility] 09/25/2014, 5:26 PM

## 2014-09-25 NOTE — ED Notes (Signed)
Report given to Robin RN regarding patient being transferred to [address] . Discussed current medication, pt is currently on Cardizem infusion. Robin RN reported that they cannot take patient if she is on Cardizem infusion. EDP made aware. New bed assignment made. Waiting for stepdown bed.

## 2014-09-25 NOTE — ED Notes (Signed)
Brought in by [facility] from [facility] for c/o left hip pain  x 2 days, staff states No falls noted. Pt states she fell x 2 days ago

## 2014-09-26 ENCOUNTER — Inpatient Hospital Stay (HOSPITAL_COMMUNITY): Payer: Medicare HMO

## 2014-09-26 ENCOUNTER — Inpatient Hospital Stay (HOSPITAL_COMMUNITY): Payer: Medicare HMO | Admitting: Certified Registered Nurse Anesthetist

## 2014-09-26 ENCOUNTER — Encounter (HOSPITAL_COMMUNITY)
Admission: EM | Disposition: A | Discharge status: Skilled Nursing Facility | Payer: Self-pay | Source: Home / Self Care | Attending: Internal Medicine

## 2014-09-26 HISTORY — PX: HIP ARTHROPLASTY: SHX981

## 2014-09-26 LAB — TROPONIN I
Troponin I: <0.03 ng/mL (ref <0.031)
Troponin I: <0.03 ng/mL (ref <0.031)

## 2014-09-26 SURGERY — HEMIARTHROPLASTY, HIP, DIRECT ANTERIOR APPROACH, FOR FRACTURE
Anesthesia: General | Site: Hip | Laterality: Left

## 2014-09-26 MED ORDER — METOCLOPRAMIDE HCL 5 MG/ML IJ SOLN: 5.0000 mg | Freq: Three times a day (TID) | INTRAMUSCULAR | Status: DC | PRN

## 2014-09-26 MED ORDER — ONDANSETRON HCL 4 MG PO TABS: 4.0000 mg | ORAL_TABLET | Freq: Four times a day (QID) | ORAL | Status: DC | PRN

## 2014-09-26 MED ORDER — ESMOLOL BOLUS VIA INFUSION
500.0000 ug/kg | Freq: Once | INTRAVENOUS | Status: AC
  Administered 2014-09-26: 29450 ug via INTRAVENOUS

## 2014-09-26 MED ORDER — DILTIAZEM LOAD VIA INFUSION
5.0000 mg | Freq: Once | INTRAVENOUS | Status: AC
  Administered 2014-09-26: 5 mg via INTRAVENOUS
  Filled 2014-09-26: qty 5

## 2014-09-26 MED ORDER — ONDANSETRON HCL 4 MG/2ML IJ SOLN
INTRAMUSCULAR | Status: AC
  Filled 2014-09-26: qty 2

## 2014-09-26 MED ORDER — DILTIAZEM HCL 100 MG IV SOLR
5.0000 mg/h | INTRAVENOUS | Status: DC
  Administered 2014-09-26: 5 mg/h via INTRAVENOUS
  Filled 2014-09-26: qty 100

## 2014-09-26 MED ORDER — ACETAMINOPHEN 325 MG PO TABS: 650.0000 mg | ORAL_TABLET | Freq: Four times a day (QID) | ORAL | Status: DC | PRN

## 2014-09-26 MED ORDER — LIDOCAINE HCL (CARDIAC) 20 MG/ML IV SOLN
INTRAVENOUS | Status: DC | PRN
  Administered 2014-09-26: 60 mg via INTRAVENOUS

## 2014-09-26 MED ORDER — FENTANYL CITRATE (PF) 250 MCG/5ML IJ SOLN
INTRAMUSCULAR | Status: AC
  Filled 2014-09-26: qty 5

## 2014-09-26 MED ORDER — ONDANSETRON HCL 4 MG/2ML IJ SOLN: 4.0000 mg | Freq: Four times a day (QID) | INTRAMUSCULAR | Status: DC | PRN

## 2014-09-26 MED ORDER — ASPIRIN EC 325 MG PO TBEC
325.0000 mg | DELAYED_RELEASE_TABLET | Freq: Every day | ORAL | Status: DC
  Administered 2014-09-27 – 2014-09-28 (×2): 325 mg via ORAL
  Filled 2014-09-26 (×2): qty 1

## 2014-09-26 MED ORDER — EPHEDRINE SULFATE 50 MG/ML IJ SOLN
INTRAMUSCULAR | Status: DC | PRN
  Administered 2014-09-26: 5 mg via INTRAVENOUS

## 2014-09-26 MED ORDER — ESMOLOL HCL 10 MG/ML IV SOLN
INTRAVENOUS | Status: DC | PRN
  Administered 2014-09-26: 30 mg via INTRAVENOUS

## 2014-09-26 MED ORDER — LIDOCAINE HCL (CARDIAC) 20 MG/ML IV SOLN
INTRAVENOUS | Status: AC
  Filled 2014-09-26: qty 5

## 2014-09-26 MED ORDER — FENTANYL CITRATE (PF) 100 MCG/2ML IJ SOLN
INTRAMUSCULAR | Status: DC | PRN
  Administered 2014-09-26 (×4): 50 ug via INTRAVENOUS

## 2014-09-26 MED ORDER — FENTANYL CITRATE (PF) 100 MCG/2ML IJ SOLN: 25.0000 ug | INTRAMUSCULAR | Status: DC | PRN

## 2014-09-26 MED ORDER — PHENYLEPHRINE HCL 10 MG/ML IJ SOLN
INTRAMUSCULAR | Status: DC | PRN
  Administered 2014-09-26: 200 ug via INTRAVENOUS
  Administered 2014-09-26: 250 ug via INTRAVENOUS
  Administered 2014-09-26: 200 ug via INTRAVENOUS
  Administered 2014-09-26: 150 ug via INTRAVENOUS

## 2014-09-26 MED ORDER — BUPIVACAINE HCL (PF) 0.25 % IJ SOLN
INTRAMUSCULAR | Status: AC
  Filled 2014-09-26: qty 30

## 2014-09-26 MED ORDER — PROMETHAZINE HCL 25 MG/ML IJ SOLN: 6.2500 mg | INTRAMUSCULAR | Status: DC | PRN

## 2014-09-26 MED ORDER — DEXTROSE 5 % IV SOLN
10.0000 mg | INTRAVENOUS | Status: DC | PRN
  Administered 2014-09-26: 100 ug/min via INTRAVENOUS

## 2014-09-26 MED ORDER — BUPIVACAINE HCL (PF) 0.25 % IJ SOLN
INTRAMUSCULAR | Status: DC | PRN
  Administered 2014-09-26: 10 mL

## 2014-09-26 MED ORDER — 0.9 % SODIUM CHLORIDE (POUR BTL) OPTIME
TOPICAL | Status: DC | PRN
  Administered 2014-09-26: 1000 mL

## 2014-09-26 MED ORDER — PHENOL 1.4 % MT LIQD
1.0000 | OROMUCOSAL | Status: DC | PRN
  Filled 2014-09-26: qty 177

## 2014-09-26 MED ORDER — LACTATED RINGERS IV SOLN
INTRAVENOUS | Status: DC
  Administered 2014-09-26: 15:00:00 via INTRAVENOUS

## 2014-09-26 MED ORDER — LACTATED RINGERS IV SOLN
INTRAVENOUS | Status: DC | PRN
  Administered 2014-09-26: 16:00:00 via INTRAVENOUS

## 2014-09-26 MED ORDER — PROPOFOL 10 MG/ML IV BOLUS
INTRAVENOUS | Status: DC | PRN
  Administered 2014-09-26: 40 mg via INTRAVENOUS

## 2014-09-26 MED ORDER — ACETAMINOPHEN 650 MG RE SUPP: 650.0000 mg | Freq: Four times a day (QID) | RECTAL | Status: DC | PRN

## 2014-09-26 MED ORDER — METOCLOPRAMIDE HCL 10 MG PO TABS: 5.0000 mg | ORAL_TABLET | Freq: Three times a day (TID) | ORAL | Status: DC | PRN

## 2014-09-26 MED ORDER — CEFAZOLIN SODIUM-DEXTROSE 2-3 GM-% IV SOLR
INTRAVENOUS | Status: AC
  Administered 2014-09-26: 2 g via INTRAVENOUS
  Filled 2014-09-26: qty 50

## 2014-09-26 MED ORDER — MENTHOL 3 MG MT LOZG
1.0000 | LOZENGE | OROMUCOSAL | Status: DC | PRN
  Filled 2014-09-26: qty 9

## 2014-09-26 MED ORDER — ONDANSETRON HCL 4 MG/2ML IJ SOLN
INTRAMUSCULAR | Status: DC | PRN
  Administered 2014-09-26: 4 mg via INTRAVENOUS

## 2014-09-26 MED ORDER — ESMOLOL HCL 10 MG/ML IV SOLN
INTRAVENOUS | Status: AC
  Filled 2014-09-26: qty 10

## 2014-09-26 SURGICAL SUPPLY — 66 items
APL SKNCLS STERI-STRIP NONHPOA (GAUZE/BANDAGES/DRESSINGS) ×1
BENZOIN TINCTURE PRP APPL 2/3 (GAUZE/BANDAGES/DRESSINGS) ×2 IMPLANT
BLADE SAW SAG 73X25 THK (BLADE) ×1
BLADE SAW SGTL 73X25 THK (BLADE) ×1 IMPLANT
BLADE SURG ROTATE 9660 (MISCELLANEOUS) IMPLANT
BRUSH FEMORAL CANAL (MISCELLANEOUS) IMPLANT
CANISTER SUCTION 2500CC (MISCELLANEOUS) ×2 IMPLANT
CAPT HIP HEMI 1 ×2 IMPLANT
COVER BACK TABLE 24X17X13 BIG (DRAPES) IMPLANT
DRAPE IMP U-DRAPE 54X76 (DRAPES) ×2 IMPLANT
DRAPE INCISE IOBAN 66X45 STRL (DRAPES) IMPLANT
DRAPE ORTHO SPLIT 77X108 STRL (DRAPES) ×4
DRAPE SURG ORHT 6 SPLT 77X108 (DRAPES) ×2 IMPLANT
DRAPE U-SHAPE 47X51 STRL (DRAPES) ×2 IMPLANT
DRESSING ALLEVYN LIFE SACRUM (GAUZE/BANDAGES/DRESSINGS) ×2 IMPLANT
DRSG ADAPTIC 3X8 NADH LF (GAUZE/BANDAGES/DRESSINGS) ×2 IMPLANT
DRSG MEPILEX BORDER 4X8 (GAUZE/BANDAGES/DRESSINGS) ×2 IMPLANT
DRSG PAD ABDOMINAL 8X10 ST (GAUZE/BANDAGES/DRESSINGS) ×2 IMPLANT
DURAPREP 26ML APPLICATOR (WOUND CARE) ×2 IMPLANT
ELECT CAUTERY BLADE 6.4 (BLADE) ×2 IMPLANT
ELECT REM PT RETURN 9FT ADLT (ELECTROSURGICAL) ×2
ELECTRODE REM PT RTRN 9FT ADLT (ELECTROSURGICAL) ×1 IMPLANT
EVACUATOR 1/8 PVC DRAIN (DRAIN) IMPLANT
FACESHIELD WRAPAROUND (MASK) ×4 IMPLANT
GAUZE SPONGE 4X4 12PLY STRL (GAUZE/BANDAGES/DRESSINGS) ×2 IMPLANT
GAUZE XEROFORM 5X9 LF (GAUZE/BANDAGES/DRESSINGS) ×2 IMPLANT
GLOVE BIOGEL PI IND STRL 8 (GLOVE) ×2 IMPLANT
GLOVE BIOGEL PI INDICATOR 8 (GLOVE) ×2
GLOVE ORTHO TXT STRL SZ7.5 (GLOVE) ×4 IMPLANT
GOWN STRL REUS W/ TWL LRG LVL3 (GOWN DISPOSABLE) ×1 IMPLANT
GOWN STRL REUS W/ TWL XL LVL3 (GOWN DISPOSABLE) ×1 IMPLANT
GOWN STRL REUS W/TWL 2XL LVL3 (GOWN DISPOSABLE) ×2 IMPLANT
GOWN STRL REUS W/TWL LRG LVL3 (GOWN DISPOSABLE) ×2
GOWN STRL REUS W/TWL XL LVL3 (GOWN DISPOSABLE) ×2
HANDPIECE INTERPULSE COAX TIP (DISPOSABLE)
IMMOBILIZER KNEE 20 (SOFTGOODS) ×2
IMMOBILIZER KNEE 20 THIGH 36 (SOFTGOODS) ×1 IMPLANT
IMMOBILIZER KNEE 22 UNIV (SOFTGOODS) IMPLANT
KIT BASIN OR (CUSTOM PROCEDURE TRAY) ×2 IMPLANT
KIT ROOM TURNOVER OR (KITS) ×2 IMPLANT
MANIFOLD NEPTUNE II (INSTRUMENTS) IMPLANT
NDL SUT 2 .5 CRC MAYO 1.732X (NEEDLE) ×1 IMPLANT
NEEDLE HYPO 25GX1X1/2 BEV (NEEDLE) ×2 IMPLANT
NEEDLE MAYO TAPER (NEEDLE) ×2
NS IRRIG 1000ML POUR BTL (IV SOLUTION) ×2 IMPLANT
PACK TOTAL JOINT (CUSTOM PROCEDURE TRAY) ×2 IMPLANT
PACK UNIVERSAL I (CUSTOM PROCEDURE TRAY) ×2 IMPLANT
PAD ARMBOARD 7.5X6 YLW CONV (MISCELLANEOUS) ×4 IMPLANT
PIN STEINMAN 3/16 (PIN) IMPLANT
SET HNDPC FAN SPRY TIP SCT (DISPOSABLE) IMPLANT
SPONGE LAP 4X18 X RAY DECT (DISPOSABLE) ×4 IMPLANT
STAPLER VISISTAT 35W (STAPLE) ×2 IMPLANT
STRIP CLOSURE SKIN 1/2X4 (GAUZE/BANDAGES/DRESSINGS) ×4 IMPLANT
SUCTION FRAZIER TIP 10 FR DISP (SUCTIONS) ×2 IMPLANT
SUT ETHIBOND NAB CT1 #1 30IN (SUTURE) ×6 IMPLANT
SUT TICRON (SUTURE) IMPLANT
SUT VIC AB 2-0 CT1 27 (SUTURE) ×4
SUT VIC AB 2-0 CT1 TAPERPNT 27 (SUTURE) ×2 IMPLANT
SUT VICRYL 0 TIES 12 18 (SUTURE) ×2 IMPLANT
SYR CONTROL 10ML LL (SYRINGE) ×2 IMPLANT
TAPE CLOTH SURG 6X10 WHT LF (GAUZE/BANDAGES/DRESSINGS) ×2 IMPLANT
TOWEL OR 17X24 6PK STRL BLUE (TOWEL DISPOSABLE) ×2 IMPLANT
TOWEL OR 17X26 10 PK STRL BLUE (TOWEL DISPOSABLE) ×2 IMPLANT
TOWER CARTRIDGE SMART MIX (DISPOSABLE) IMPLANT
TRAY FOLEY CATH 16FRSI W/METER (SET/KITS/TRAYS/PACK) IMPLANT
WATER STERILE IRR 1000ML POUR (IV SOLUTION) IMPLANT

## 2014-09-26 NOTE — Progress Notes (Signed)
[facility] PROGRESS NOTE  Ellen [name] ZOX:[id number] DOB: September 05, 1926 DOA: 09/25/2014 PCP: [name], MD  Assessment/Plan: Active Problems:   Iron deficiency anemia   Coronary atherosclerosis   GERD   Essential hypertension, benign   Closed left hip fracture   Hip fracture   Atrial fibrillation with RVR   Atrial fibrillation with rapid ventricular response-currently rate controlled, will repeat EKG, currently off   Cardizem drip  , continue patient on by mouth Cardizem, hold nadolol, currently not on anticoagulation given age and high fall risk, will check TSH, replete electrolytes, magnesium 1.8, keep magnesium greater than 2.0. Recent 2-D echo 55-60%, no regional wall motion abnormalities, pulmonary hypertension    Iron deficiency anemia-patient received aranesp at cancer center. Hemoglobin stable   Coronary atherosclerosis-continue telemetry, cardiac enzymes negative, last seen by Dr. Verdis Prime and asked to follow prn    GERD-continue PPI   Essential hypertension, benign, controlled-continue IV and by mouth Cardizem, Cozaar, hold nadolol   Closed left hip fracture-patient seen by Dr. Eldred Manges, MD. Anticipated to have surgery today. She will require a hemiarthroplasty procedure. High perioperative risk given age, history of coronary artery disease, oxygen dependent COPD, atrial fibrillation. The risk of surgery was discussed with the daughter and she would like to proceed  Code Status:      Code Status Orders        Start     Ordered   09/25/14 1722  Full code   Continuous     09/25/14 1723    Advance Directive Documentation        Most Recent Value   Type of Advance Directive  Healthcare Power of Attorney, Living will   Pre-existing out of facility DNR order (yellow form or pink MOST form)     "MOST" Form in Place?       Family Communication: family updated about patient's clinical progress Disposition Plan:  As above     Brief narrative: 81 year old female with a history of oxygen dependent COPD, allergic rhinitis, coronary artery disease, iron deficiency anemia, gastroesophageal reflux disease, seen in the ER on 7/31, after a fall at a memory care facility. She did not lose consciousness. According to the staff and EMS the patient was back to her baseline and was discharged back to the facility. She was brought back in by EMS today on 8/7 with complaints of left hip pain for 2 days. No recurrent falls since 7/31.She was also noted to have bruising at her right distal forearm yesterday. She has a history of atrial fibrillation not on anticoagulation and was found to be in rapid A. fib with a heart rate of 132 for which she was placed on a Cardizem drip. Patient transferred to [facility] for orthopedic consultation for displaced, non-comminuted fracture of L femoral neck on imaging.Daughter at bedside, POA. Daughter states she ambulates with walker on her own. Sundowns almost every night. Had a fall 7 days ago, found on floor. Was able to walk all week , no other falls. Last 24 hrs hip short and externally rotated with apparent pain attempting to ambulate. Has lost weight more than 20 lbs with dementia  Consultants:  Orthopedics  Procedures:  None  Antibiotics: Anti-infectives    None         HPI/Subjective: Patient nonverbal  Objective: Filed Vitals:   09/26/14 0600 09/26/14 0638 09/26/14 0700 09/26/14 0805  BP: 104/49 106/91 116/67 92/71  Pulse: 82  88 89  Temp:    97.3 F (36.3 C)  TempSrc:    Axillary  Resp: 14  20 12   Height:      Weight:      SpO2: 98%  98% 94%    Intake/Output Summary (Last 24 hours) at 09/26/14 1031 Last data filed at 09/26/14 0932  Gross per 24 hour  Intake    593 ml  Output    300 ml  Net    293 ml    Exam:  General: No acute respiratory distress Lungs: Clear to auscultation bilaterally without wheezes or crackles Cardiovascular: Regular rate  and rhythm without murmur gallop or rub normal S1 and S2 Abdomen: Nontender, nondistended, soft, bowel sounds positive, no rebound, no ascites, no appreciable mass Extremities: No significant cyanosis, clubbing, or edema bilateral lower extremities     Data Review   Micro Results Recent Results (from the past 240 hour(s))  MRSA PCR Screening     Status: None   Collection Time: 09/25/14  3:26 PM  Result Value Ref Range Status   MRSA by PCR NEGATIVE NEGATIVE Final    Comment:        The GeneXpert MRSA Assay (FDA approved for NASAL specimens only), is one component of a comprehensive MRSA colonization surveillance program. It is not intended to diagnose MRSA infection nor to guide or monitor treatment for MRSA infections.     Radiology Reports Dg Chest 1 View  09/25/2014   CLINICAL DATA:  Pt states she fell at nursing home 2 days ago. States chronic sob. Denies chest pain. Hx of cabg.  EXAM: CHEST  1 VIEW  COMPARISON:  09/06/2013  FINDINGS: Changes from CABG surgery are stable. Cardiac silhouette is mildly enlarged. No mediastinal or hilar masses or convincing adenopathy.  Lungs show areas of reticular nodular opacity reflecting dilated partly mucous filled bronchi. These findings are stable. There is elevation of left hemidiaphragm, also stable.  No acute lung consolidation and no evidence of pulmonary edema. No pleural effusion or pneumothorax.  Bony thorax is demineralized but grossly intact.  IMPRESSION: 1. No acute cardiopulmonary disease.   Electronically Signed   By: Amie Portland M.D.   On: 09/25/2014 10:32   Dg Thoracic Spine 2 View  09/18/2014   CLINICAL DATA:  Acute upper thoracic pain following fall today. Initial encounter.  EXAM: THORACIC SPINE 2 VIEWS  COMPARISON:  07/04/2014 and prior chest radiographs  FINDINGS: Normal alignment is noted.  There is no evidence of acute fracture or subluxation.  Mild multilevel degenerative disc disease identified.  No focal bony lesions  are identified.  CABG changes are again noted.  IMPRESSION: No evidence of acute bony abnormality.   Electronically Signed   By: Harmon Pier M.D.   On: 09/18/2014 08:17   Dg Shoulder Right  09/18/2014   CLINICAL DATA:  Unwitnessed fall.  Shoulder pain  EXAM: RIGHT SHOULDER - 2+ VIEW  COMPARISON:  None.  FINDINGS: Glenohumeral joint is intact. No evidence of scapular fracture or humeral fracture. The acromioclavicular joint is intact.  IMPRESSION: No fracture or dislocation.   Electronically Signed   By: Genevive Bi M.D.   On: 09/18/2014 08:16   Ct Head Wo Contrast  09/18/2014   CLINICAL DATA:  Unwitnessed fall, now with posterior neck pain.  EXAM: CT HEAD WITHOUT CONTRAST  CT CERVICAL SPINE WITHOUT CONTRAST  TECHNIQUE: Multidetector CT imaging of the head and cervical spine was performed following the standard protocol without intravenous contrast. Multiplanar CT image reconstructions of  the cervical spine were also generated.  COMPARISON:  Head CT - 05/18/2012  FINDINGS: CT HEAD FINDINGS  Examination is minimally degraded secondary to patient motion, necessitating the acquisition of additional images.  Similar findings of advanced atrophy with sulcal prominence and centralized volume loss with commensurate ex vacuo dilatation the ventricular system. Scattered periventricular hypodensities compatible with microvascular ischemic disease. Unchanged punctate calcification in the right basal ganglia. Given extensive background parenchymal abnormalities, there is no CT evidence of superimposed acute large territory infarct. No intraparenchymal or extra-axial mass or hemorrhage. Unchanged size and configuration of the ventricles and basilar cisterns. No midline shift. Intracranial atherosclerosis.  Regional soft tissues appear normal. Post bilateral cataract surgery. Limited visualization the paranasal sinuses and mastoid air cells is normal. No air-fluid levels. No displaced calvarial fracture.  CT CERVICAL  SPINE FINDINGS  C1 to the superior endplate of T3 is imaged.  There is minimal (approximately 1-2 mm) of anterolisthesis of C3 upon C4 and C7 upon T1. The dens is normally positioned between the lateral masses of C1. Normal atlantodental and atlantoaxial articulations. The bilateral facets are normally aligned.  No fracture or static subluxation of cervical spine. Cervical vertebral body heights are preserved. Prevertebral soft tissues are normal.  Mild to moderate multilevel cervical spine DDD, worse at C5-C6 and C6-C7 with disc space height loss, endplate irregularity and small posteriorly directed disc osteophyte complexes at these locations. There is partial ossification the nuchal ligament posterior to the C3 spinous process.  Atherosclerotic plaque within the bilateral carotid bulbs. There is mild diffuse heterogeneity of the thyroid parenchyma without discrete nodule on this noncontrast examination. No bulky cervical lymphadenopathy on this noncontrast examination.  Limited visualization of lung apices demonstrates ill-defined slightly nodular opacities within the imaged portions of the bilateral lung apices (representative images 75 and 83, series 7).  IMPRESSION: 1. Similar findings of advanced atrophy and microvascular ischemic disease without acute intracranial process. 2. No fracture or static subluxation of the cervical spine. 3. Mild to moderate multilevel cervical spine DDD, worse at C5-C6 and C6-C7.   Electronically Signed   By: Simonne Come M.D.   On: 09/18/2014 08:13   Ct Cervical Spine Wo Contrast  09/18/2014   CLINICAL DATA:  Unwitnessed fall, now with posterior neck pain.  EXAM: CT HEAD WITHOUT CONTRAST  CT CERVICAL SPINE WITHOUT CONTRAST  TECHNIQUE: Multidetector CT imaging of the head and cervical spine was performed following the standard protocol without intravenous contrast. Multiplanar CT image reconstructions of the cervical spine were also generated.  COMPARISON:  Head CT - 05/18/2012   FINDINGS: CT HEAD FINDINGS  Examination is minimally degraded secondary to patient motion, necessitating the acquisition of additional images.  Similar findings of advanced atrophy with sulcal prominence and centralized volume loss with commensurate ex vacuo dilatation the ventricular system. Scattered periventricular hypodensities compatible with microvascular ischemic disease. Unchanged punctate calcification in the right basal ganglia. Given extensive background parenchymal abnormalities, there is no CT evidence of superimposed acute large territory infarct. No intraparenchymal or extra-axial mass or hemorrhage. Unchanged size and configuration of the ventricles and basilar cisterns. No midline shift. Intracranial atherosclerosis.  Regional soft tissues appear normal. Post bilateral cataract surgery. Limited visualization the paranasal sinuses and mastoid air cells is normal. No air-fluid levels. No displaced calvarial fracture.  CT CERVICAL SPINE FINDINGS  C1 to the superior endplate of T3 is imaged.  There is minimal (approximately 1-2 mm) of anterolisthesis of C3 upon C4 and C7 upon T1. The dens is normally positioned  between the lateral masses of C1. Normal atlantodental and atlantoaxial articulations. The bilateral facets are normally aligned.  No fracture or static subluxation of cervical spine. Cervical vertebral body heights are preserved. Prevertebral soft tissues are normal.  Mild to moderate multilevel cervical spine DDD, worse at C5-C6 and C6-C7 with disc space height loss, endplate irregularity and small posteriorly directed disc osteophyte complexes at these locations. There is partial ossification the nuchal ligament posterior to the C3 spinous process.  Atherosclerotic plaque within the bilateral carotid bulbs. There is mild diffuse heterogeneity of the thyroid parenchyma without discrete nodule on this noncontrast examination. No bulky cervical lymphadenopathy on this noncontrast examination.   Limited visualization of lung apices demonstrates ill-defined slightly nodular opacities within the imaged portions of the bilateral lung apices (representative images 75 and 83, series 7).  IMPRESSION: 1. Similar findings of advanced atrophy and microvascular ischemic disease without acute intracranial process. 2. No fracture or static subluxation of the cervical spine. 3. Mild to moderate multilevel cervical spine DDD, worse at C5-C6 and C6-C7.   Electronically Signed   By: Simonne Come M.D.   On: 09/18/2014 08:13   Dg Hip Unilat With Pelvis 2-3 Views Left  09/25/2014   CLINICAL DATA:  Larey Seat 2 days ago at the nursing home. Left hip pain since.  EXAM: DG HIP (WITH OR WITHOUT PELVIS) 2-3V LEFT  COMPARISON:  None.  FINDINGS: There is a displaced, non comminuted fracture of the left femoral neck. This is in mid cervical femoral neck fracture. Shaft component has retracted superiorly displacing 15 mm. There is also apex anterior angulation of approximately 30 degrees as well as significant varus angulation.  There are no other fractures. Bones are diffusely demineralized. There is superior lateral hip joint space narrowing on the left.  Soft tissues demonstrate vascular calcifications but are otherwise unremarkable.  IMPRESSION: 1. Displaced, non comminuted fracture of the left femoral neck with varus and apex anterior angulation.   Electronically Signed   By: Amie Portland M.D.   On: 09/25/2014 10:29     CBC  Recent Labs Lab 09/25/14 1050 09/25/14 1841  WBC 12.2* 11.2*  HGB 13.6 13.8  HCT 41.6 41.6  PLT 288 290  MCV 90.6 90.4  MCH 29.6 30.0  MCHC 32.7 33.2  RDW 13.6 13.4  LYMPHSABS 0.8  --   MONOABS 0.8  --   EOSABS 0.0  --   BASOSABS 0.0  --     Chemistries   Recent Labs Lab 09/25/14 1050 09/25/14 1841  NA 132*  --   K 4.1  --   CL 96*  --   CO2 26  --   GLUCOSE 148*  --   BUN 17  --   CREATININE 0.62 0.70  CALCIUM 9.0  --   MG  --  1.8    ------------------------------------------------------------------------------------------------------------------ estimated creatinine clearance is 39.9 mL/min (by C-G formula based on Cr of 0.7). ------------------------------------------------------------------------------------------------------------------ No results for input(s): HGBA1C in the last 72 hours. ------------------------------------------------------------------------------------------------------------------ No results for input(s): CHOL, HDL, LDLCALC, TRIG, CHOLHDL, LDLDIRECT in the last 72 hours. ------------------------------------------------------------------------------------------------------------------  Recent Labs  09/25/14 1841  TSH 0.914   ------------------------------------------------------------------------------------------------------------------ No results for input(s): VITAMINB12, FOLATE, FERRITIN, TIBC, IRON, RETICCTPCT in the last 72 hours.  Coagulation profile  Recent Labs Lab 09/25/14 1050  INR 1.19    No results for input(s): DDIMER in the last 72 hours.  Cardiac Enzymes  Recent Labs Lab 09/25/14 1841 09/25/14 2336 09/26/14 0534  TROPONINI 0.03 <0.03 <0.03   ------------------------------------------------------------------------------------------------------------------  Invalid input(s): POCBNP   CBG: No results for input(s): GLUCAP in the last 168 hours.     Studies: Dg Chest 1 View  09/25/2014   CLINICAL DATA:  Pt states she fell at nursing home 2 days ago. States chronic sob. Denies chest pain. Hx of cabg.  EXAM: CHEST  1 VIEW  COMPARISON:  09/06/2013  FINDINGS: Changes from CABG surgery are stable. Cardiac silhouette is mildly enlarged. No mediastinal or hilar masses or convincing adenopathy.  Lungs show areas of reticular nodular opacity reflecting dilated partly mucous filled bronchi. These findings are stable. There is elevation of left hemidiaphragm, also stable.   No acute lung consolidation and no evidence of pulmonary edema. No pleural effusion or pneumothorax.  Bony thorax is demineralized but grossly intact.  IMPRESSION: 1. No acute cardiopulmonary disease.   Electronically Signed   By: Amie Portland M.D.   On: 09/25/2014 10:32   Dg Hip Unilat With Pelvis 2-3 Views Left  09/25/2014   CLINICAL DATA:  Larey Seat 2 days ago at the nursing home. Left hip pain since.  EXAM: DG HIP (WITH OR WITHOUT PELVIS) 2-3V LEFT  COMPARISON:  None.  FINDINGS: There is a displaced, non comminuted fracture of the left femoral neck. This is in mid cervical femoral neck fracture. Shaft component has retracted superiorly displacing 15 mm. There is also apex anterior angulation of approximately 30 degrees as well as significant varus angulation.  There are no other fractures. Bones are diffusely demineralized. There is superior lateral hip joint space narrowing on the left.  Soft tissues demonstrate vascular calcifications but are otherwise unremarkable.  IMPRESSION: 1. Displaced, non comminuted fracture of the left femoral neck with varus and apex anterior angulation.   Electronically Signed   By: Amie Portland M.D.   On: 09/25/2014 10:29      No results found for: HGBA1C Lab Results  Component Value Date   CREATININE 0.70 09/25/2014       Scheduled Meds: . sodium chloride   Intravenous STAT  . ALPRAZolam  0.25 mg Oral QHS  . antiseptic oral rinse  7 mL Mouth Rinse BID  . aspirin EC  325 mg Oral Daily  . diltiazem  30 mg Oral 4 times per day  . docusate sodium  100 mg Oral BID  . enoxaparin (LOVENOX) injection  40 mg Subcutaneous Q24H  . feeding supplement (ENSURE ENLIVE)  237 mL Oral BID BM  . losartan  50 mg Oral Daily  . memantine  10 mg Oral Daily  . pantoprazole  40 mg Oral Daily  . potassium chloride SA  20 mEq Oral Daily  . pravastatin  20 mg Oral Daily  . sertraline  50 mg Oral Daily  . sodium chloride  3 mL Intravenous Q12H   Continuous Infusions: . sodium  chloride 10 mL/hr at 09/25/14 1110    Active Problems:   Iron deficiency anemia   Coronary atherosclerosis   GERD   Essential hypertension, benign   Closed left hip fracture   Hip fracture   Atrial fibrillation with RVR    Time spent: 45 minutes   [facility]  [facility] Pager [phone]. If 7PM-7AM, please contact night-coverage at [email or web address], password [facility] 09/26/2014, 10:31 AM  LOS: 1 day

## 2014-09-26 NOTE — Interval H&P Note (Signed)
History and Physical Interval Note:  09/26/2014 3:07 PM  Ellen [name]  has presented today for surgery, with the diagnosis of left femoral neck fracture  The various methods of treatment have been discussed with the patient and family. After consideration of risks, benefits and other options for treatment, the patient has consented to  Procedure(s): ARTHROPLASTY MONOPOLAR HIP (HEMIARTHROPLASTY) (Left) as a surgical intervention .  The patient's history has been reviewed, patient examined, no change in status, stable for surgery.  I have reviewed the patient's chart and labs.  Questions were answered to the patient's satisfaction.     Agatha Duplechain C

## 2014-09-26 NOTE — Brief Op Note (Signed)
09/25/2014 - 09/26/2014  4:56 PM  PATIENT:  [name]  81 y.o. female  PRE-OPERATIVE DIAGNOSIS:  left femoral neck fracture  POST-OPERATIVE DIAGNOSIS:  same  PROCEDURE:  Procedure(s): ARTHROPLASTY MONOPOLAR HIP (HEMIARTHROPLASTY) (Left)  SURGEON:  Surgeon(s) and Role:    Eldred Manges, MD - Primary  PHYSICIAN ASSISTANT:   ASSISTANTS: Zonia Kief PA-C  ANESTHESIA:   local and general  EBL:  Total I/O In: 3 [I.V.:3] Out: 415 [Urine:415]  BLOOD ADMINISTERED:none  DRAINS: none   LOCAL MEDICATIONS USED:  MARCAINE     SPECIMEN:  No Specimen  DISPOSITION OF SPECIMEN:  N/A  COUNTS:  YES  TOURNIQUET:  * No tourniquets in log *  DICTATION: .Other Dictation: Dictation Number [id number]  PLAN OF CARE: already inpatient  PATIENT DISPOSITION:  PACU - hemodynamically stable.   Delay start of Pharmacological VTE agent (>24hrs) due to surgical blood loss or risk of bleeding: yes

## 2014-09-26 NOTE — Progress Notes (Signed)
Patient arrived to 2C02 via [facility]. Patient alert to person only. Daughter at bedside. Copy of living will/HCPOA placed in chart.

## 2014-09-26 NOTE — Progress Notes (Signed)
Patient went for surgery. Daughter Ellen [name] at bedside.

## 2014-09-26 NOTE — Anesthesia Postprocedure Evaluation (Signed)
  Anesthesia Post-op Note  Patient: Ellen [name]  Procedure(s) Performed: Procedure(s): ARTHROPLASTY MONOPOLAR HIP (HEMIARTHROPLASTY) (Left)  Patient Location: PACU  Anesthesia Type: General   Level of Consciousness: awake, alert  and oriented  Airway and Oxygen Therapy: Patient Spontanous Breathing  Post-op Pain: mild  Post-op Assessment: Post-op Vital signs reviewed  Post-op Vital Signs: Reviewed  Last Vitals:  Filed Vitals:   09/26/14 1830  BP: 102/61  Pulse:   Temp:   Resp:     Complications: No apparent anesthesia complications

## 2014-09-26 NOTE — Transfer of Care (Signed)
Immediate Anesthesia Transfer of Care Note  Patient: Ellen [name]  Procedure(s) Performed: Procedure(s): ARTHROPLASTY MONOPOLAR HIP (HEMIARTHROPLASTY) (Left)  Patient Location: PACU  Anesthesia Type:General  Level of Consciousness: sedated  Airway & Oxygen Therapy: Patient Spontanous Breathing and Patient connected to nasal cannula oxygen  Post-op Assessment: Report given to RN and Post -op Vital signs reviewed and stable  Post vital signs: Reviewed and stable  Last Vitals:  Filed Vitals:   09/26/14 1705  BP: 129/75  Pulse: 142  Temp: 36.7 C  Resp: 15    Complications: Afib HR 160-165. Dr. Okey Dupre notified. Esmolol given.

## 2014-09-26 NOTE — Progress Notes (Signed)
Utilization Review Completed.  

## 2014-09-26 NOTE — Anesthesia Procedure Notes (Signed)
Procedure Name: Intubation Date/Time: 09/26/2014 4:09 PM Performed by: Orvilla Fus A Pre-anesthesia Checklist: Patient identified, Emergency Drugs available, Suction available, Patient being monitored and Timeout performed Patient Re-evaluated:Patient Re-evaluated prior to inductionOxygen Delivery Method: Circle system utilized Preoxygenation: Pre-oxygenation with 100% oxygen Intubation Type: IV induction Ventilation: Mask ventilation without difficulty and Oral airway inserted - appropriate to patient size Laryngoscope Size: Mac and 4 Grade View: Grade I Tube type: Oral Tube size: 7.0 mm Number of attempts: 1 Airway Equipment and Method: Stylet Placement Confirmation: ETT inserted through vocal cords under direct vision,  positive ETCO2 and breath sounds checked- equal and bilateral Secured at: 21 cm Tube secured with: Tape Dental Injury: Teeth and Oropharynx as per pre-operative assessment

## 2014-09-26 NOTE — H&P (View-Only) (Signed)
Reason for Consult:left displaced femoral neck fracture Referring Physician:   [facility] MD  Ellen [name] is an 81 y.o. female.  HPI: 81 yo female with dementia stays in assisted living. Was in [facility] , over each of last years time she has progressively needed the next increase level of care.  Daughter at bedside, Ellen [name]. Daughter states she ambulates with walker on her own. Sundowns almost every night. Had a fall 7 days ago, found on floor. Was able to walk all week , no other falls. Last 24 hrs hip short and externally rotated with apparent pain attempting to ambulate.  Has lost weight more than 20 lbs with dementia.   Past Medical History  Diagnosis Date  . Chronic airway obstruction, not elsewhere classified   . Allergic rhinitis, cause unspecified   . Esophageal reflux   . Iron deficiency anemia, unspecified   . Coronary atherosclerosis of unspecified type of vessel, native or graft   . Dysfunction of eustachian tube   . Obesity, unspecified   . Macular degeneration, right eye Being treated.  . Macular degeneration, left eye Blind     Past Surgical History  Procedure Laterality Date  . Coronary artery bypass graft      Family History  Problem Relation Age of Onset  . Colon cancer Brother   . Breast cancer Sister   . Heart disease Father   . Hypertension Father   . Heart disease Mother   . Hypertension Mother     Social History:  reports that she has never smoked. She has never used smokeless tobacco. Her alcohol and drug histories are not on file.  Allergies:  Allergies  Allergen Reactions  . Doxycycline Other (See Comments)    Destroyed good bacteria in GI tract  . Clarithromycin Other (See Comments)    H/A .[name]..patient unsure of reaction    . Prinivil [Lisinopril] Other (See Comments)    Patient unsure of reaction  . Propulsid [Cisapride] Other (See Comments)    Patient unsure of reaction    Medications: I have reviewed the patient's current  medications.  Results for orders placed or performed during the hospital encounter of 09/25/14 (from the past 48 hour(s))  Basic metabolic panel     Status: Abnormal   Collection Time: 09/25/14 10:50 AM  Result Value Ref Range   Sodium 132 (L) 135 - 145 mmol/L   Potassium 4.1 3.5 - 5.1 mmol/L   Chloride 96 (L) 101 - 111 mmol/L   CO2 26 22 - 32 mmol/L   Glucose, Bld 148 (H) 65 - 99 mg/dL   BUN 17 6 - 20 mg/dL   Creatinine, Ser 0.62 0.44 - 1.00 mg/dL   Calcium 9.0 8.9 - 10.3 mg/dL   GFR calc non Af Amer >60 >60 mL/min   GFR calc Af Amer >60 >60 mL/min    Comment: (NOTE) The eGFR has been calculated using the CKD EPI equation. This calculation has not been validated in all clinical situations. eGFR's persistently <60 mL/min signify possible Chronic Kidney Disease.    Anion gap 10 5 - 15  CBC WITH DIFFERENTIAL     Status: Abnormal   Collection Time: 09/25/14 10:50 AM  Result Value Ref Range   WBC 12.2 (H) 4.0 - 10.5 K/uL   RBC 4.59 3.87 - 5.11 MIL/uL   Hemoglobin 13.6 12.0 - 15.0 g/dL   HCT 41.6 36.0 - 46.0 %   MCV 90.6 78.0 - 100.0 fL   MCH 29.6 26.0 -  34.0 pg   MCHC 32.7 30.0 - 36.0 g/dL   RDW 13.6 11.5 - 15.5 %   Platelets 288 150 - 400 K/uL   Neutrophils Relative % 87 (H) 43 - 77 %   Neutro Abs 10.5 (H) 1.7 - 7.7 K/uL   Lymphocytes Relative 7 (L) 12 - 46 %   Lymphs Abs 0.8 0.7 - 4.0 K/uL   Monocytes Relative 6 3 - 12 %   Monocytes Absolute 0.8 0.1 - 1.0 K/uL   Eosinophils Relative 0 0 - 5 %   Eosinophils Absolute 0.0 0.0 - 0.7 K/uL   Basophils Relative 0 0 - 1 %   Basophils Absolute 0.0 0.0 - 0.1 K/uL  Protime-INR     Status: Abnormal   Collection Time: 09/25/14 10:50 AM  Result Value Ref Range   Prothrombin Time 15.3 (H) 11.6 - 15.2 seconds   INR 1.19 0.00 - 1.49  Urinalysis, Routine w reflex microscopic (not at [facility])     Status: Abnormal   Collection Time: 09/25/14 11:23 AM  Result Value Ref Range   Color, Urine AMBER (A) YELLOW    Comment: BIOCHEMICALS MAY BE  AFFECTED BY COLOR   APPearance CLEAR CLEAR   Specific Gravity, Urine 1.024 1.005 - 1.030   pH 6.0 5.0 - 8.0   Glucose, UA NEGATIVE NEGATIVE mg/dL   Hgb urine dipstick NEGATIVE NEGATIVE   Bilirubin Urine NEGATIVE NEGATIVE   Ketones, ur 40 (A) NEGATIVE mg/dL   Protein, ur NEGATIVE NEGATIVE mg/dL   Urobilinogen, UA 0.2 0.0 - 1.0 mg/dL   Nitrite NEGATIVE NEGATIVE   Leukocytes, UA NEGATIVE NEGATIVE    Comment: MICROSCOPIC NOT DONE ON URINES WITH NEGATIVE PROTEIN, BLOOD, LEUKOCYTES, NITRITE, OR GLUCOSE <1000 mg/dL.    Dg Chest 1 View  09/25/2014   CLINICAL DATA:  Pt states she fell at nursing home 2 days ago. States chronic sob. Denies chest pain. Hx of cabg.  EXAM: CHEST  1 VIEW  COMPARISON:  09/06/2013  FINDINGS: Changes from CABG surgery are stable. Cardiac silhouette is mildly enlarged. No mediastinal or hilar masses or convincing adenopathy.  Lungs show areas of reticular nodular opacity reflecting dilated partly mucous filled bronchi. These findings are stable. There is elevation of left hemidiaphragm, also stable.  No acute lung consolidation and no evidence of pulmonary edema. No pleural effusion or pneumothorax.  Bony thorax is demineralized but grossly intact.  IMPRESSION: 1. No acute cardiopulmonary disease.   Electronically Signed   By: Lajean Manes M.D.   On: 09/25/2014 10:32   Dg Hip Unilat With Pelvis 2-3 Views Left  09/25/2014   CLINICAL DATA:  [place] 2 days ago at the nursing home. Left hip pain since.  EXAM: DG HIP (WITH OR WITHOUT PELVIS) 2-3V LEFT  COMPARISON:  None.  FINDINGS: There is a displaced, non comminuted fracture of the left femoral neck. This is in mid cervical femoral neck fracture. Shaft component has retracted superiorly displacing 15 mm. There is also apex anterior angulation of approximately 30 degrees as well as significant varus angulation.  There are no other fractures. Bones are diffusely demineralized. There is superior lateral hip joint space narrowing on the left.   Soft tissues demonstrate vascular calcifications but are otherwise unremarkable.  IMPRESSION: 1. Displaced, non comminuted fracture of the left femoral neck with varus and apex anterior angulation.   Electronically Signed   By: Lajean Manes M.D.   On: 09/25/2014 10:29    Review of Systems  Unable to perform ROS:  dementia   Blood pressure 137/65, pulse 93, temperature 98.4 F (36.9 C), temperature source Oral, resp. rate 23, weight 68.04 kg (150 lb), SpO2 97 %. Physical Exam  Constitutional: She is oriented to person, place, and time. She appears well-developed. No distress.  Sleeping, sitting up in bed.   HENT:  Head: Normocephalic and atraumatic.  Eyes: Pupils are equal, round, and reactive to light.  Neck: Normal range of motion.  Cardiovascular: Normal rate and regular rhythm.   Respiratory: Effort normal. She has no wheezes.  GI: Soft. Bowel sounds are normal.  Musculoskeletal:  Left LE short and external rotated.   Neurological: She is alert and oriented to person, place, and time.  Skin: Skin is warm and dry.  Psychiatric:  Dementia, disoriented.     Assessment/Plan: Displaced left femoral neck fracture.    Will post for surgery Monday afternoon in anticipation of clearance for surgery. Hemiarthroplasty procedure. Discussed with daughter planned procedure, risks. All ?'s answered . Risks of dislocation, fracture of femur, infection, PE, MI, CVA and death discussed.  Surgery time approx  30 minutes.     Mertie Haslem C 09/25/2014, 3:57 PM

## 2014-09-26 NOTE — Anesthesia Preprocedure Evaluation (Addendum)
Anesthesia Evaluation  Patient identified by MRN, date of birth, ID band Patient awake    Reviewed: Allergy & Precautions, NPO status , Patient's Chart, lab work & pertinent test results  Airway Mallampati: II  TM Distance: >3 FB Neck ROM: Full    Dental no notable dental hx.    Pulmonary COPD oxygen dependent,  Oxygen at night breath sounds clear to auscultation  Pulmonary exam normal       Cardiovascular hypertension, + CAD and + CABG + dysrhythmias Atrial Fibrillation Rhythm:Irregular Rate:Normal  Left ventricle: The cavity size was normal. Systolic function was normal. The estimated ejection fraction was in the range of 55% to 60%. Wall motion was normal; there were no regional wall motion abnormalities. - Aortic valve: Trileaflet; normal thickness, mildly calcified leaflets. There was mild to moderate regurgitation. - Aorta: There is moderate focal calcification of the Aorta at the sinuses of Valsalva. No history in the chart of aortic root surgery or AV surgery in the past. - Mitral valve: There was mild to moderate regurgitation. - Left atrium: The atrium was severely dilated. - Tricuspid valve: There was mild-moderate regurgitation. - Pulmonic valve: There was trivial regurgitation. - Pulmonary arteries: PA peak pressure: 37 mm Hg (S).  Impressions:  - The right ventricular systolic pressure was increased consistent with mild pulmonary hypertension. 04/2014   Neuro/Psych dementia negative psych ROS   GI/Hepatic negative GI ROS, Neg liver ROS,   Endo/Other  negative endocrine ROS  Renal/GU negative Renal ROS  negative genitourinary   Musculoskeletal negative musculoskeletal ROS (+)   Abdominal   Peds negative pediatric ROS (+)  Hematology negative hematology ROS (+)   Anesthesia Other Findings   Reproductive/Obstetrics negative OB ROS                            Anesthesia Physical Anesthesia Plan  ASA: III  Anesthesia Plan: General   Post-op Pain Management:    Induction: Intravenous  Airway Management Planned: Oral ETT  Additional Equipment:   Intra-op Plan:   Post-operative Plan: Extubation in OR  Informed Consent: I have reviewed the patients History and Physical, chart, labs and discussed the procedure including the risks, benefits and alternatives for the proposed anesthesia with the patient or authorized representative who has indicated his/her understanding and acceptance.   Dental advisory given  Plan Discussed with: CRNA and Surgeon  Anesthesia Plan Comments:         Anesthesia Quick Evaluation

## 2014-09-27 ENCOUNTER — Encounter (HOSPITAL_COMMUNITY): Payer: Self-pay | Admitting: Orthopaedic Surgery

## 2014-09-27 LAB — CBC
HCT: 37.4 % (ref 36.0–46.0)
Hemoglobin: 11.8 g/dL — ABNORMAL LOW (ref 12.0–15.0)
MCH: 29.4 pg (ref 26.0–34.0)
MCHC: 31.6 g/dL (ref 30.0–36.0)
MCV: 93.3 fL (ref 78.0–100.0)
PLATELETS: 300 10*3/uL (ref 150–400)
RBC: 4.01 MIL/uL (ref 3.87–5.11)
RDW: 13.6 % (ref 11.5–15.5)
WBC: 9.8 10*3/uL (ref 4.0–10.5)

## 2014-09-27 LAB — PROTIME-INR
INR: 1.21 (ref 0.00–1.49)
PROTHROMBIN TIME: 15.4 s — AB (ref 11.6–15.2)

## 2014-09-27 LAB — COMPREHENSIVE METABOLIC PANEL
ALBUMIN: 2.6 g/dL — AB (ref 3.5–5.0)
ALT: 151 U/L — ABNORMAL HIGH (ref 14–54)
AST: 152 U/L — ABNORMAL HIGH (ref 15–41)
Alkaline Phosphatase: 198 U/L — ABNORMAL HIGH (ref 38–126)
Anion gap: 9 (ref 5–15)
BUN: 19 mg/dL (ref 6–20)
CHLORIDE: 97 mmol/L — AB (ref 101–111)
CO2: 28 mmol/L (ref 22–32)
CREATININE: 0.88 mg/dL (ref 0.44–1.00)
Calcium: 8.4 mg/dL — ABNORMAL LOW (ref 8.9–10.3)
GFR calc Af Amer: >60 mL/min (ref >60)
GFR, EST NON AFRICAN AMERICAN: 58 mL/min — AB (ref >60)
Glucose, Bld: 91 mg/dL (ref 65–99)
Potassium: 4.9 mmol/L (ref 3.5–5.1)
SODIUM: 134 mmol/L — AB (ref 135–145)
Total Bilirubin: 1.1 mg/dL (ref 0.3–1.2)
Total Protein: 5.8 g/dL — ABNORMAL LOW (ref 6.5–8.1)

## 2014-09-27 LAB — ACETAMINOPHEN LEVEL: Acetaminophen (Tylenol), Serum: <10 ug/mL — ABNORMAL LOW (ref 10–30)

## 2014-09-27 MED ORDER — DILTIAZEM HCL 25 MG/5ML IV SOLN
5.0000 mg | Freq: Once | INTRAVENOUS | Status: DC
  Filled 2014-09-27: qty 5

## 2014-09-27 MED ORDER — DIGOXIN 0.25 MG/ML IJ SOLN
0.2500 mg | Freq: Once | INTRAMUSCULAR | Status: DC
  Filled 2014-09-27: qty 1

## 2014-09-27 MED ORDER — DILTIAZEM HCL 30 MG PO TABS
30.0000 mg | ORAL_TABLET | Freq: Four times a day (QID) | ORAL | Status: DC
  Administered 2014-09-27 – 2014-09-28 (×6): 30 mg via ORAL
  Filled 2014-09-27 (×9): qty 1

## 2014-09-27 MED ORDER — DILTIAZEM LOAD VIA INFUSION: 5.0000 mg | Freq: Once | INTRAVENOUS | Status: DC

## 2014-09-27 MED ORDER — SODIUM CHLORIDE 0.9 % IV BOLUS (SEPSIS)
500.0000 mL | Freq: Once | INTRAVENOUS | Status: AC
  Administered 2014-09-27: 500 mL via INTRAVENOUS

## 2014-09-27 NOTE — Clinical Social Work Note (Signed)
CSW received consult for patient to possibly need SNF.  PT eval has not been completed due to patient not medically ready for therapy to begin according to PT.  CSW did not have time to assess, will ask covering CSW to see patient if SNF is recommended by PT.  Ervin Knack. Clark Cuff, MSW, Theresia Majors [phone] 09/27/2014 5:59 PM

## 2014-09-27 NOTE — Op Note (Signed)
NAME:  Ellen [name], VITALE               ACCOUNT NO.:  [name]  MEDICAL RECORD NO.:  [id number]  LOCATION:  2C02C                        FACILITY:  [facility]  PHYSICIAN:  Linnea Todisco C. Ophelia Charter, M.D.    DATE OF BIRTH:  May 06, 1926  DATE OF PROCEDURE:  09/25/2014 DATE OF DISCHARGE:                              OPERATIVE REPORT   PREOPERATIVE DIAGNOSIS:  Left displaced femoral neck fracture.  POSTOPERATIVE DIAGNOSIS:  Left displaced femoral neck fracture.  PROCEDURE:  Left press-fit Depuy monopolar hemiarthroplasty, 43 ball, #2 stem +0 neck.  SURGEON:  Findley Blankenbaker C. Ophelia Charter, M.D.  ASSISTANT:  Genene Churn. Barry Dienes, PA-C, medically necessary and present for the entire procedure.  ANESTHESIA:  General plus Marcaine skin local 10 mL.  COMPLICATIONS:  None.  PROCEDURE IN DETAIL:  After induction of general anesthesia, the patient was placed in lateral position with axillary roll, prepping and draping with DuraPrep, preoperative Ancef prophylaxis.  Usual total hip sheets, drapes, impervious stockinette, and a Coban was applied.  Time-out procedure, sterile skin marker, Betadine, Steri-Drape x2.  Posterior approach was made.  Gluteus maximus was split.  Deep Charnley retractor was placed.  Piriformis was tagged and cut.  Posterior capsule was opened.  Hematoma evacuated.  Femoral neck was noted.  Neck was cut 1 fingerbreadth above the lesser trochanter.  Head was removed with a corkscrew.  Head was measured, measured 43, 42 was too small, it was unable to fit to the 42 hole.  Acetabulum was emptied, good suction fit with a 43.  Preparation of the canal, some lateralization was performed, #1 was fairly tight, we progressed up to #2 with prosthesis that all the way down with the calcar after irrigation.  A +0 neck, 43 ball, #2 stem, DePuy press-fit.  Good stability flexion to 90 degrees and internal rotation to 85 degrees.  Negative shuck, stable on external rotation. Piriformis was repaired to gluteus medius  tendon.  Tensor fascia and gluteus maximus were closed with #1 Ethibond, 2-0 Vicryl in the subcutaneous tissue, and skin staple closure.  Marcaine infiltration, postop dressing, knee immobilizer, transferred to the recovery room in stable condition.  The patient had an area all over sacrum which was red which was noted on the admission and the pink sacral pad was applied to this for extra padding for skin protection.    [name] C. Ophelia Charter, M.D.    MCY/MEDQ  D:  09/26/2014  T:  09/27/2014  Job:  [id number]

## 2014-09-27 NOTE — Progress Notes (Signed)
Subjective: Doing well this morning.  C/o hip pain.     Objective: Vital signs in last 24 hours: Temp:  [96.8 F (36 C)-98.6 F (37 C)] 98.6 F (37 C) (08/09 0722) Pulse Rate:  [54-150] 123 (08/09 1219) Resp:  [11-39] 25 (08/09 1219) BP: (63-202)/(40-149) 93/51 mmHg (08/09 1219) SpO2:  [84 %-100 %] 99 % (08/09 1219) FiO2 (%):  [50 %] 50 % (08/08 2308)  Intake/Output from previous day: 08/08 0701 - 08/09 0700 In: 1457.8 [I.V.:1457.8] Out: 415 [Urine:415] Intake/Output this shift: Total I/O In: 180 [P.O.:120; I.V.:60] Out: 30 [Urine:30]   Recent Labs  09/25/14 1050 09/25/14 1841 09/27/14 0426  HGB 13.6 13.8 11.8*    Recent Labs  09/25/14 1841 09/27/14 0426  WBC 11.2* 9.8  RBC 4.60 4.01  HCT 41.6 37.4  PLT 290 300    Recent Labs  09/25/14 1050 09/25/14 1841 09/27/14 0426  NA 132*  --  134*  K 4.1  --  4.9  CL 96*  --  97*  CO2 26  --  28  BUN 17  --  19  CREATININE 0.62 0.70 0.88  GLUCOSE 148*  --  91  CALCIUM 9.0  --  8.4*    Recent Labs  09/25/14 1050 09/27/14 0935  INR 1.19 1.21    Neurovascular intact Dorsiflexion/Plantar flexion intact No cellulitis present Compartment soft  Assessment/Plan: Start PT.  Will need snf placement.  Continue present care.    Favour Aleshire,Faulcon M 09/27/2014, 12:48 PM

## 2014-09-27 NOTE — Evaluation (Addendum)
Clinical/Bedside Swallow Evaluation Patient Details  Name: Ellen [name] MRN: [id number] Date of Birth: [birth year]  Today's Date: 09/27/2014 Time: SLP Start Time (ACUTE ONLY): 1410 SLP Stop Time (ACUTE ONLY): 1430 SLP Time Calculation (min) (ACUTE ONLY): 20 min  Past Medical History:  Past Medical History  Diagnosis Date  . Chronic airway obstruction, not elsewhere classified   . Allergic rhinitis, cause unspecified   . Esophageal reflux   . Iron deficiency anemia, unspecified   . Coronary atherosclerosis of unspecified type of vessel, native or graft   . Dysfunction of eustachian tube   . Obesity, unspecified   . Macular degeneration, right eye Being treated.  . Macular degeneration, left eye Blind    Past Surgical History:  Past Surgical History  Procedure Laterality Date  . Coronary artery bypass graft    . Hip arthroplasty Left 09/26/2014    Procedure: ARTHROPLASTY MONOPOLAR HIP (HEMIARTHROPLASTY);  Surgeon: Eldred Manges, MD;  Location: [facility] OR;  Service: Orthopedics;  Laterality: Left;   HPI:  Pt is an 81 y.o. female with PMH oxygen dependent COPD, allergic rhinitis, coronary artery disease, iron deficiency anemia, gastroesophageal reflux disease, seen in the ER on 7/31, after a fall at a memory care facility. She did not lose consciousness. According to the staff and EMS the patient was back to her baseline and was discharged back to the facility. She was brought back in by EMS today on 8/7 with complaints of left hip pain for 2 days. No recurrent falls since 7/31.She was also noted to have bruising at her right distal forearm yesterday. She has a history of atrial fibrillation not on anticoagulation and was found to be in rapid A. fib with a heart rate of 132. Pt sundowns almost every night. Pt has reportedly lost more than 20 lbs with dementia. Pt had surgery for L hip fracture on 8/8. CXR 8/7 showed no acute disease. Bedside swallow eval ordered.   Assessment / Plan /  Recommendation Clinical Impression  Pt currently lethargic with waxing/ waning alertness which is the main risk factor for aspiration. Pt with a delayed swallow initiation of thin liquid; however, no overt s/s of aspiration noted with trials by straw. Attempted puree trial; pt moved material around in mouth ~1 minute and eventually swallowed with multiple verbal/ tactile cues. Pt is at high risk of aspiration of regular foods given current lethargy. Daughter at bedside reported that pt "rarely" had difficulties swalllowing prior to surgery. Given these findings, recommend initiating full liquid diet, full supervision to cue small sips at a time and monitor alertness. Would also recommend meds via alternative means or crushed in puree if must be taken orally until mentation improves. Do not provide puree textures unless fully alert; have oral suction available. Will continue to follow for diet tolerance/ advancement.    Aspiration Risk  Severe    Diet Recommendation Thin   Medication Administration: Via alternative means Compensations: Slow rate;Small sips/bites    Other  Recommendations Oral Care Recommendations: Oral care BID Other Recommendations: Have oral suction available   Follow Up Recommendations       Frequency and Duration min 2x/week  1 week   Pertinent Vitals/Pain L hip pain when elevating/ reclining bed    SLP Swallow Goals     Swallow Study Prior Functional Status       General Other Pertinent Information: Pt is an 81 y.o. female with PMH oxygen dependent COPD, allergic rhinitis, coronary artery disease, iron deficiency anemia, gastroesophageal reflux disease,  seen in the ER on 7/31, after a fall at a memory care facility. She did not lose consciousness. According to the staff and EMS the patient was back to her baseline and was discharged back to the facility. She was brought back in by EMS today on 8/7 with complaints of left hip pain for 2 days. No recurrent falls  since 7/31.She was also noted to have bruising at her right distal forearm yesterday. She has a history of atrial fibrillation not on anticoagulation and was found to be in rapid A. fib with a heart rate of 132. Pt sundowns almost every night. Pt has reportedly lost more than 20 lbs with dementia. Pt had surgery for L hip fracture on 8/8. CXR 8/7 showed no acute disease. Bedside swallow eval ordered. Type of Study: Bedside swallow evaluation Diet Prior to this Study: NPO Temperature Spikes Noted: No Respiratory Status: Supplemental O2 delivered via (comment) History of Recent Intubation: No Behavior/Cognition: Lethargic/Drowsy;Requires cueing Oral Cavity - Dentition: Missing dentition Self-Feeding Abilities: Total assist Patient Positioning: Upright in bed Baseline Vocal Quality: Hoarse Volitional Cough: Weak Volitional Swallow: Unable to elicit    Oral/Motor/Sensory Function Overall Oral Motor/Sensory Function: Appears within functional limits for tasks assessed (limited assessment d/t lethargy) Labial ROM: Within Functional Limits Labial Symmetry: Within Functional Limits Lingual ROM: Within Functional Limits Lingual Symmetry: Within Functional Limits   Ice Chips Ice chips: Not tested   Thin Liquid Thin Liquid: Impaired Presentation: Straw Pharyngeal  Phase Impairments: Suspected delayed Swallow    Nectar Thick Nectar Thick Liquid: Not tested   Honey Thick Honey Thick Liquid: Not tested   Puree Puree: Impaired Presentation: Spoon Oral Phase Impairments: Poor awareness of bolus Oral Phase Functional Implications: Oral holding;Oral residue Pharyngeal Phase Impairments: Suspected delayed Swallow   Solid   GO    Solid: Not tested       Metro Kung, MA, CCC-SLP 09/27/2014,2:36 PM [phone]

## 2014-09-27 NOTE — Progress Notes (Signed)
[facility] PROGRESS NOTE  Ellen [name] ZOX:[id number] DOB: 24-Feb-1926 DOA: 09/25/2014 PCP: [name], MD  Assessment/Plan: Active Problems:   Iron deficiency anemia   Coronary atherosclerosis   GERD   Essential hypertension, benign   Closed left hip fracture   Hip fracture   Atrial fibrillation with RVR   Atrial fibrillation with rapid ventricular response-currently heart rate is borderline, currently off   Cardizem drip  , continue patient on by mouth Cardizem,  will give 1 dose of digoxin 0.25 mg IV for better rate control in the setting of soft blood pressure, hold nadolol, currently not on anticoagulation given age and high fall risk, serial cardiac enzymes negative 3, TSH within normal limits, replete electrolytes, magnesium 1.8, keep magnesium greater than 2.0. Recent 2-D echo 55-60%, no regional wall motion abnormalities, pulmonary hypertension  Transaminitis-AST ALT mildly elevated, bilirubin normal, DC Tylenol, Tylenol level pending,  and clear etiology, order an acute hepatitis panel, right upper quadrant ultrasound, discontinue pravastatin    Iron deficiency anemia-patient received aranesp at cancer center. Hemoglobin stable postoperatively    Coronary atherosclerosis-continue telemetry, cardiac enzymes negative, last seen by Dr. Verdis Prime and asked to follow prn    GERD-continue PPI   Essential hypertension, benign, controlled-continue IV and by mouth Cardizem, holding Cozaar and  nadolol   Closed left hip fracture-patient seen by Dr. Eldred Manges, MD.  status post left hemiarthroplasty on 8/9   High perioperative risk given age, history of coronary artery disease, oxygen dependent COPD, atrial fibrillation. The risk of surgery was discussed with the daughter and she would like to proceed  Code Status:      Code Status Orders        Start     Ordered   09/25/14 1722  Full code   Continuous     09/25/14 1723    Advance Directive  Documentation        Most Recent Value   Type of Advance Directive  Healthcare Power of Attorney, Living will   Pre-existing out of facility DNR order (yellow form or pink MOST form)     "MOST" Form in Place?       Family Communication: family updated about patient's clinical progress Disposition Plan:  Patient will need SNF, PT eval pending   Brief narrative: 81 year old female with a history of oxygen dependent COPD, allergic rhinitis, coronary artery disease, iron deficiency anemia, gastroesophageal reflux disease, seen in the ER on 7/31, after a fall at a memory care facility. She did not lose consciousness. According to the staff and EMS the patient was back to her baseline and was discharged back to the facility. She was brought back in by EMS today on 8/7 with complaints of left hip pain for 2 days. No recurrent falls since 7/31.She was also noted to have bruising at her right distal forearm yesterday. She has a history of atrial fibrillation not on anticoagulation and was found to be in rapid A. fib with a heart rate of 132 for which she was placed on a Cardizem drip. Patient transferred to [facility] for orthopedic consultation for displaced, non-comminuted fracture of L femoral neck on imaging.Daughter at bedside, POA. Daughter states she ambulates with walker on her own. Sundowns almost every night. Had a fall 7 days ago, found on floor. Was able to walk all week , no other falls. Last 24 hrs hip short and externally rotated with apparent pain attempting to ambulate. Has lost  weight more than 20 lbs with dementia  Consultants:  Orthopedics  Procedures:  None  Antibiotics: Anti-infectives    Start     Dose/Rate Route Frequency Ordered Stop   09/26/14 1540  ceFAZolin (ANCEF) 2-3 GM-% IVPB SOLR    Comments:  [place] Fus   : cabinet override      09/26/14 1540 09/26/14 1555         HPI/Subjective: Patient nonverbal, blood pressure soft post surgery, off Cardizem  drip  Objective: Filed Vitals:   09/27/14 0133 09/27/14 0300 09/27/14 0500 09/27/14 0722  BP: 98/45  103/54 105/44  Pulse:   109 100  Temp:  97.4 F (36.3 C)  98.6 F (37 C)  TempSrc:  Oral  Oral  Resp:   16 15  Height:      Weight:      SpO2:   96% 100%    Intake/Output Summary (Last 24 hours) at 09/27/14 1028 Last data filed at 09/27/14 1914  Gross per 24 hour  Intake 1454.75 ml  Output    295 ml  Net 1159.75 ml    Exam:  General: No acute respiratory distress Lungs: Clear to auscultation bilaterally without wheezes or crackles Cardiovascular: Regular rate and rhythm without murmur gallop or rub normal S1 and S2 Abdomen: Nontender, nondistended, soft, bowel sounds positive, no rebound, no ascites, no appreciable mass Extremities: No significant cyanosis, clubbing, or edema bilateral lower extremities     Data Review   Micro Results Recent Results (from the past 240 hour(s))  MRSA PCR Screening     Status: None   Collection Time: 09/25/14  3:26 PM  Result Value Ref Range Status   MRSA by PCR NEGATIVE NEGATIVE Final    Comment:        The GeneXpert MRSA Assay (FDA approved for NASAL specimens only), is one component of a comprehensive MRSA colonization surveillance program. It is not intended to diagnose MRSA infection nor to guide or monitor treatment for MRSA infections.     Radiology Reports Dg Chest 1 View  09/25/2014   CLINICAL DATA:  Pt states she fell at nursing home 2 days ago. States chronic sob. Denies chest pain. Hx of cabg.  EXAM: CHEST  1 VIEW  COMPARISON:  09/06/2013  FINDINGS: Changes from CABG surgery are stable. Cardiac silhouette is mildly enlarged. No mediastinal or hilar masses or convincing adenopathy.  Lungs show areas of reticular nodular opacity reflecting dilated partly mucous filled bronchi. These findings are stable. There is elevation of left hemidiaphragm, also stable.  No acute lung consolidation and no evidence of pulmonary  edema. No pleural effusion or pneumothorax.  Bony thorax is demineralized but grossly intact.  IMPRESSION: 1. No acute cardiopulmonary disease.   Electronically Signed   By: Amie Portland M.D.   On: 09/25/2014 10:32   Dg Thoracic Spine 2 View  09/18/2014   CLINICAL DATA:  Acute upper thoracic pain following fall today. Initial encounter.  EXAM: THORACIC SPINE 2 VIEWS  COMPARISON:  07/04/2014 and prior chest radiographs  FINDINGS: Normal alignment is noted.  There is no evidence of acute fracture or subluxation.  Mild multilevel degenerative disc disease identified.  No focal bony lesions are identified.  CABG changes are again noted.  IMPRESSION: No evidence of acute bony abnormality.   Electronically Signed   By: Harmon Pier M.D.   On: 09/18/2014 08:17   Dg Shoulder Right  09/18/2014   CLINICAL DATA:  Unwitnessed fall.  Shoulder pain  EXAM: RIGHT  SHOULDER - 2+ VIEW  COMPARISON:  None.  FINDINGS: Glenohumeral joint is intact. No evidence of scapular fracture or humeral fracture. The acromioclavicular joint is intact.  IMPRESSION: No fracture or dislocation.   Electronically Signed   By: Genevive Bi M.D.   On: 09/18/2014 08:16   Ct Head Wo Contrast  09/18/2014   CLINICAL DATA:  Unwitnessed fall, now with posterior neck pain.  EXAM: CT HEAD WITHOUT CONTRAST  CT CERVICAL SPINE WITHOUT CONTRAST  TECHNIQUE: Multidetector CT imaging of the head and cervical spine was performed following the standard protocol without intravenous contrast. Multiplanar CT image reconstructions of the cervical spine were also generated.  COMPARISON:  Head CT - 05/18/2012  FINDINGS: CT HEAD FINDINGS  Examination is minimally degraded secondary to patient motion, necessitating the acquisition of additional images.  Similar findings of advanced atrophy with sulcal prominence and centralized volume loss with commensurate ex vacuo dilatation the ventricular system. Scattered periventricular hypodensities compatible with microvascular  ischemic disease. Unchanged punctate calcification in the right basal ganglia. Given extensive background parenchymal abnormalities, there is no CT evidence of superimposed acute large territory infarct. No intraparenchymal or extra-axial mass or hemorrhage. Unchanged size and configuration of the ventricles and basilar cisterns. No midline shift. Intracranial atherosclerosis.  Regional soft tissues appear normal. Post bilateral cataract surgery. Limited visualization the paranasal sinuses and mastoid air cells is normal. No air-fluid levels. No displaced calvarial fracture.  CT CERVICAL SPINE FINDINGS  C1 to the superior endplate of T3 is imaged.  There is minimal (approximately 1-2 mm) of anterolisthesis of C3 upon C4 and C7 upon T1. The dens is normally positioned between the lateral masses of C1. Normal atlantodental and atlantoaxial articulations. The bilateral facets are normally aligned.  No fracture or static subluxation of cervical spine. Cervical vertebral body heights are preserved. Prevertebral soft tissues are normal.  Mild to moderate multilevel cervical spine DDD, worse at C5-C6 and C6-C7 with disc space height loss, endplate irregularity and small posteriorly directed disc osteophyte complexes at these locations. There is partial ossification the nuchal ligament posterior to the C3 spinous process.  Atherosclerotic plaque within the bilateral carotid bulbs. There is mild diffuse heterogeneity of the thyroid parenchyma without discrete nodule on this noncontrast examination. No bulky cervical lymphadenopathy on this noncontrast examination.  Limited visualization of lung apices demonstrates ill-defined slightly nodular opacities within the imaged portions of the bilateral lung apices (representative images 75 and 83, series 7).  IMPRESSION: 1. Similar findings of advanced atrophy and microvascular ischemic disease without acute intracranial process. 2. No fracture or static subluxation of the cervical  spine. 3. Mild to moderate multilevel cervical spine DDD, worse at C5-C6 and C6-C7.   Electronically Signed   By: Simonne Come M.D.   On: 09/18/2014 08:13   Ct Cervical Spine Wo Contrast  09/18/2014   CLINICAL DATA:  Unwitnessed fall, now with posterior neck pain.  EXAM: CT HEAD WITHOUT CONTRAST  CT CERVICAL SPINE WITHOUT CONTRAST  TECHNIQUE: Multidetector CT imaging of the head and cervical spine was performed following the standard protocol without intravenous contrast. Multiplanar CT image reconstructions of the cervical spine were also generated.  COMPARISON:  Head CT - 05/18/2012  FINDINGS: CT HEAD FINDINGS  Examination is minimally degraded secondary to patient motion, necessitating the acquisition of additional images.  Similar findings of advanced atrophy with sulcal prominence and centralized volume loss with commensurate ex vacuo dilatation the ventricular system. Scattered periventricular hypodensities compatible with microvascular ischemic disease. Unchanged punctate calcification in the  right basal ganglia. Given extensive background parenchymal abnormalities, there is no CT evidence of superimposed acute large territory infarct. No intraparenchymal or extra-axial mass or hemorrhage. Unchanged size and configuration of the ventricles and basilar cisterns. No midline shift. Intracranial atherosclerosis.  Regional soft tissues appear normal. Post bilateral cataract surgery. Limited visualization the paranasal sinuses and mastoid air cells is normal. No air-fluid levels. No displaced calvarial fracture.  CT CERVICAL SPINE FINDINGS  C1 to the superior endplate of T3 is imaged.  There is minimal (approximately 1-2 mm) of anterolisthesis of C3 upon C4 and C7 upon T1. The dens is normally positioned between the lateral masses of C1. Normal atlantodental and atlantoaxial articulations. The bilateral facets are normally aligned.  No fracture or static subluxation of cervical spine. Cervical vertebral body  heights are preserved. Prevertebral soft tissues are normal.  Mild to moderate multilevel cervical spine DDD, worse at C5-C6 and C6-C7 with disc space height loss, endplate irregularity and small posteriorly directed disc osteophyte complexes at these locations. There is partial ossification the nuchal ligament posterior to the C3 spinous process.  Atherosclerotic plaque within the bilateral carotid bulbs. There is mild diffuse heterogeneity of the thyroid parenchyma without discrete nodule on this noncontrast examination. No bulky cervical lymphadenopathy on this noncontrast examination.  Limited visualization of lung apices demonstrates ill-defined slightly nodular opacities within the imaged portions of the bilateral lung apices (representative images 75 and 83, series 7).  IMPRESSION: 1. Similar findings of advanced atrophy and microvascular ischemic disease without acute intracranial process. 2. No fracture or static subluxation of the cervical spine. 3. Mild to moderate multilevel cervical spine DDD, worse at C5-C6 and C6-C7.   Electronically Signed   By: Simonne Come M.D.   On: 09/18/2014 08:13   Dg Hip Port Unilat With Pelvis 1v Left  09/26/2014   CLINICAL DATA:  81 year old female status post left hip replacement  EXAM: DG HIP (WITH OR WITHOUT PELVIS) 1V PORT LEFT  COMPARISON:  Preoperative radiographs 09/25/2014  FINDINGS: Interval left hip hemi arthroplasty. No evidence of acute abnormality. Expected postoperative subcutaneous emphysema. The visualized bony pelvis and right femur appear intact.  IMPRESSION: Surgical changes of left hip hemi arthroplasty without evidence of immediate complication.   Electronically Signed   By: Malachy Moan M.D.   On: 09/26/2014 18:37   Dg Hip Unilat With Pelvis 2-3 Views Left  09/25/2014   CLINICAL DATA:  Larey Seat 2 days ago at the nursing home. Left hip pain since.  EXAM: DG HIP (WITH OR WITHOUT PELVIS) 2-3V LEFT  COMPARISON:  None.  FINDINGS: There is a displaced,  non comminuted fracture of the left femoral neck. This is in mid cervical femoral neck fracture. Shaft component has retracted superiorly displacing 15 mm. There is also apex anterior angulation of approximately 30 degrees as well as significant varus angulation.  There are no other fractures. Bones are diffusely demineralized. There is superior lateral hip joint space narrowing on the left.  Soft tissues demonstrate vascular calcifications but are otherwise unremarkable.  IMPRESSION: 1. Displaced, non comminuted fracture of the left femoral neck with varus and apex anterior angulation.   Electronically Signed   By: Amie Portland M.D.   On: 09/25/2014 10:29     CBC  Recent Labs Lab 09/25/14 1050 09/25/14 1841 09/27/14 0426  WBC 12.2* 11.2* 9.8  HGB 13.6 13.8 11.8*  HCT 41.6 41.6 37.4  PLT 288 290 300  MCV 90.6 90.4 93.3  MCH 29.6 30.0 29.4  MCHC 32.7  33.2 31.6  RDW 13.6 13.4 13.6  LYMPHSABS 0.8  --   --   MONOABS 0.8  --   --   EOSABS 0.0  --   --   BASOSABS 0.0  --   --     Chemistries   Recent Labs Lab 09/25/14 1050 09/25/14 1841 09/27/14 0426  NA 132*  --  134*  K 4.1  --  4.9  CL 96*  --  97*  CO2 26  --  28  GLUCOSE 148*  --  91  BUN 17  --  19  CREATININE 0.62 0.70 0.88  CALCIUM 9.0  --  8.4*  MG  --  1.8  --   AST  --   --  152*  ALT  --   --  151*  ALKPHOS  --   --  198*  BILITOT  --   --  1.1   ------------------------------------------------------------------------------------------------------------------ estimated creatinine clearance is 36.3 mL/min (by C-G formula based on Cr of 0.88). ------------------------------------------------------------------------------------------------------------------ No results for input(s): HGBA1C in the last 72 hours. ------------------------------------------------------------------------------------------------------------------ No results for input(s): CHOL, HDL, LDLCALC, TRIG, CHOLHDL, LDLDIRECT in the last 72  hours. ------------------------------------------------------------------------------------------------------------------  Recent Labs  09/25/14 1841  TSH 0.914   ------------------------------------------------------------------------------------------------------------------ No results for input(s): VITAMINB12, FOLATE, FERRITIN, TIBC, IRON, RETICCTPCT in the last 72 hours.  Coagulation profile  Recent Labs Lab 09/25/14 1050 09/27/14 0935  INR 1.19 1.21    No results for input(s): DDIMER in the last 72 hours.  Cardiac Enzymes  Recent Labs Lab 09/25/14 1841 09/25/14 2336 09/26/14 0534  TROPONINI 0.03 <0.03 <0.03   ------------------------------------------------------------------------------------------------------------------ Invalid input(s): POCBNP   CBG: No results for input(s): GLUCAP in the last 168 hours.     Studies: Dg Hip Port Unilat With Pelvis 1v Left  09/26/2014   CLINICAL DATA:  81 year old female status post left hip replacement  EXAM: DG HIP (WITH OR WITHOUT PELVIS) 1V PORT LEFT  COMPARISON:  Preoperative radiographs 09/25/2014  FINDINGS: Interval left hip hemi arthroplasty. No evidence of acute abnormality. Expected postoperative subcutaneous emphysema. The visualized bony pelvis and right femur appear intact.  IMPRESSION: Surgical changes of left hip hemi arthroplasty without evidence of immediate complication.   Electronically Signed   By: Malachy Moan M.D.   On: 09/26/2014 18:37      No results found for: HGBA1C Lab Results  Component Value Date   CREATININE 0.88 09/27/2014       Scheduled Meds: . ALPRAZolam  0.25 mg Oral QHS  . antiseptic oral rinse  7 mL Mouth Rinse BID  . aspirin EC  325 mg Oral Q breakfast  . digoxin  0.25 mg Intravenous Once  . diltiazem  5 mg Intravenous Once  . diltiazem  30 mg Oral 4 times per day  . docusate sodium  100 mg Oral BID  . enoxaparin (LOVENOX) injection  40 mg Subcutaneous Q24H  . feeding  supplement (ENSURE ENLIVE)  237 mL Oral BID BM  . memantine  10 mg Oral Daily  . pantoprazole  40 mg Oral Daily  . potassium chloride SA  20 mEq Oral Daily  . pravastatin  20 mg Oral Daily  . sertraline  50 mg Oral Daily  . sodium chloride  3 mL Intravenous Q12H   Continuous Infusions: . sodium chloride 10 mL/hr at 09/25/14 1110  . lactated ringers 10 mL/hr at 09/26/14 1448    Active Problems:   Iron deficiency anemia   Coronary atherosclerosis   GERD  Essential hypertension, benign   Closed left hip fracture   Hip fracture   Atrial fibrillation with RVR    Time spent: 45 minutes   [facility]  [facility] Pager [phone]. If 7PM-7AM, please contact night-coverage at [email or web address], password [facility] 09/27/2014, 10:28 AM  LOS: 2 days

## 2014-09-27 NOTE — Progress Notes (Signed)
OT Cancellation Note  Patient Details Name: Ellen [name] MRN: [id number] DOB: 1926/05/29   Cancelled Treatment:    Reason Eval/Treat Not Completed: Medical issues which prohibited therapy.Pt with strict bed rest orders still active. RN made aware and plans to contact MD to advise.  Will follow.  Evern Bio 09/27/2014, 9:30 AM  [phone]

## 2014-09-27 NOTE — Progress Notes (Signed)
PT Cancellation Note  Patient Details Name: Ellen [name] MRN: [id number] DOB: Jun 08, 1926   Cancelled Treatment:    Reason Eval/Treat Not Completed: Patient not medically ready. PT checked in with RN throughout day and pt still with strict bedrest orders. MD aware per RN. Will continue to monitor for updated activity orders and complete eval when able.    Conni Slipper 09/27/2014, 2:35 PM

## 2014-09-28 ENCOUNTER — Inpatient Hospital Stay (HOSPITAL_COMMUNITY): Payer: Medicare HMO

## 2014-09-28 DIAGNOSIS — I2510 Atherosclerotic heart disease of native coronary artery without angina pectoris: Secondary | ICD-10-CM

## 2014-09-28 DIAGNOSIS — I10 Essential (primary) hypertension: Secondary | ICD-10-CM

## 2014-09-28 DIAGNOSIS — I4891 Unspecified atrial fibrillation: Secondary | ICD-10-CM

## 2014-09-28 LAB — COMPREHENSIVE METABOLIC PANEL
ALK PHOS: 154 U/L — AB (ref 38–126)
ALT: 70 U/L — ABNORMAL HIGH (ref 14–54)
ANION GAP: 6 (ref 5–15)
AST: 61 U/L — ABNORMAL HIGH (ref 15–41)
Albumin: 2.5 g/dL — ABNORMAL LOW (ref 3.5–5.0)
BUN: 18 mg/dL (ref 6–20)
CALCIUM: 8.2 mg/dL — AB (ref 8.9–10.3)
CO2: 28 mmol/L (ref 22–32)
Chloride: 103 mmol/L (ref 101–111)
Creatinine, Ser: 0.73 mg/dL (ref 0.44–1.00)
GFR calc Af Amer: >60 mL/min (ref >60)
GFR calc non Af Amer: >60 mL/min (ref >60)
GLUCOSE: 86 mg/dL (ref 65–99)
POTASSIUM: 4.7 mmol/L (ref 3.5–5.1)
Sodium: 137 mmol/L (ref 135–145)
Total Bilirubin: 1 mg/dL (ref 0.3–1.2)
Total Protein: 5.4 g/dL — ABNORMAL LOW (ref 6.5–8.1)

## 2014-09-28 LAB — URINE CULTURE: Culture: 20000

## 2014-09-28 LAB — GLUCOSE, CAPILLARY: GLUCOSE-CAPILLARY: 74 mg/dL (ref 65–99)

## 2014-09-28 LAB — CBC
HEMATOCRIT: 36 % (ref 36.0–46.0)
HEMOGLOBIN: 11.2 g/dL — AB (ref 12.0–15.0)
MCH: 29.4 pg (ref 26.0–34.0)
MCHC: 31.1 g/dL (ref 30.0–36.0)
MCV: 94.5 fL (ref 78.0–100.0)
PLATELETS: 231 10*3/uL (ref 150–400)
RBC: 3.81 MIL/uL — AB (ref 3.87–5.11)
RDW: 13.6 % (ref 11.5–15.5)
WBC: 6.9 10*3/uL (ref 4.0–10.5)

## 2014-09-28 LAB — HEPATITIS PANEL, ACUTE
HCV Ab: <0.1 s/co ratio (ref 0.0–0.9)
HEP B S AG: NEGATIVE
Hep A IgM: NEGATIVE
Hep B C IgM: NEGATIVE

## 2014-09-28 MED ORDER — ASPIRIN EC 81 MG PO TBEC
81.0000 mg | DELAYED_RELEASE_TABLET | Freq: Every day | ORAL | Status: DC
  Administered 2014-09-29 – 2014-10-03 (×5): 81 mg via ORAL
  Filled 2014-09-28 (×7): qty 1

## 2014-09-28 MED ORDER — BISACODYL 5 MG PO TBEC
10.0000 mg | DELAYED_RELEASE_TABLET | Freq: Once | ORAL | Status: AC
  Administered 2014-09-28: 10 mg via ORAL
  Filled 2014-09-28: qty 2

## 2014-09-28 MED ORDER — DILTIAZEM HCL ER COATED BEADS 240 MG PO CP24
240.0000 mg | ORAL_CAPSULE | Freq: Every day | ORAL | Status: DC
  Administered 2014-09-29 – 2014-09-30 (×2): 240 mg via ORAL
  Filled 2014-09-28 (×3): qty 1

## 2014-09-28 MED ORDER — OXYCODONE HCL 5 MG PO TABS: 5.0000 mg | ORAL_TABLET | Freq: Four times a day (QID) | ORAL | Status: DC | PRN

## 2014-09-28 MED ORDER — DIGOXIN 0.25 MG/ML IJ SOLN
0.5000 mg | Freq: Once | INTRAMUSCULAR | Status: AC
  Administered 2014-09-28: 0.5 mg via INTRAVENOUS
  Filled 2014-09-28: qty 2

## 2014-09-28 MED ORDER — SODIUM CHLORIDE 0.9 % IV SOLN
INTRAVENOUS | Status: AC
  Administered 2014-09-28: 22:00:00 via INTRAVENOUS

## 2014-09-28 MED ORDER — DOCUSATE SODIUM 100 MG PO CAPS: 100.0000 mg | ORAL_CAPSULE | Freq: Two times a day (BID) | ORAL | Status: DC

## 2014-09-28 MED ORDER — TRAMADOL HCL 50 MG PO TABS: 50.0000 mg | ORAL_TABLET | Freq: Four times a day (QID) | ORAL | Status: DC | PRN

## 2014-09-28 MED ORDER — DILTIAZEM HCL ER COATED BEADS 180 MG PO CP24: 180.0000 mg | ORAL_CAPSULE | Freq: Every day | ORAL | Status: DC

## 2014-09-28 MED ORDER — DILTIAZEM HCL ER COATED BEADS 180 MG PO CP24
180.0000 mg | ORAL_CAPSULE | Freq: Every day | ORAL | Status: DC
  Administered 2014-09-28: 180 mg via ORAL
  Filled 2014-09-28: qty 1

## 2014-09-28 MED ORDER — DILTIAZEM HCL ER COATED BEADS 240 MG PO CP24: 240.0000 mg | ORAL_CAPSULE | Freq: Every day | ORAL | Status: DC

## 2014-09-28 MED ORDER — ALPRAZOLAM 0.25 MG PO TABS: 0.2500 mg | ORAL_TABLET | Freq: Every day | ORAL | Status: DC

## 2014-09-28 MED ORDER — DILTIAZEM HCL ER COATED BEADS 120 MG PO CP24
120.0000 mg | ORAL_CAPSULE | Freq: Once | ORAL | Status: AC
  Administered 2014-09-28: 120 mg via ORAL
  Filled 2014-09-28: qty 1

## 2014-09-28 MED ORDER — SODIUM CHLORIDE 0.9 % IV SOLN: INTRAVENOUS | Status: DC

## 2014-09-28 NOTE — Evaluation (Signed)
Physical Therapy Evaluation Patient Details Name: Ellen [name] MRN: [id number] DOB: [birth year] Today's Date: 09/28/2014   History of Present Illness  Pt is an 81 y.o. female with PMH oxygen dependent COPD, allergic rhinitis, coronary artery disease, iron deficiency anemia, gastroesophageal reflux disease, seen in the ER on 7/31, after a fall at a memory care facility. She did not lose consciousness. According to the staff and EMS the patient was back to her baseline and was discharged back to the facility. She was brought back in by EMS today on 8/7 with complaints of left hip pain for 2 days. No recurrent falls since 7/31.She was also noted to have bruising at her right distal forearm yesterday. She has a history of atrial fibrillation not on anticoagulation and was found to be in rapid A. fib with a heart rate of 132. Pt sundowns almost every night. Pt has reportedly lost more than 20 lbs with dementia. Pt had surgery for L hip fracture on 8/8. CXR 8/7 showed no acute disease. Bedside swallow eval ordered.   Clinical Impression  Pt admitted with above diagnosis. Pt currently with functional limitations due to the deficits listed below (see PT Problem List). Pt very confused but did arouse enough to attempt sitting at EOB.  Once EOB however, pt could not sit upright leaning to right and posterior and needing total assist to maintain sitting.  HR also incr to 145 bpm therefore assisted pt back to supine.  Will continue as pt tolerates.   Pt will benefit from skilled PT to increase their independence and safety with mobility to allow discharge to the venue listed below.      Follow Up Recommendations SNF;Supervision/Assistance - 24 hour    Equipment Recommendations  Other (comment) (TBA)    Recommendations for Other Services       Precautions / Restrictions Precautions Precautions: Fall;Posterior Hip Precaution Booklet Issued: Yes (comment) Required Braces or Orthoses: Knee Immobilizer -  Left Restrictions Weight Bearing Restrictions: Yes LLE Weight Bearing: Weight bearing as tolerated      Mobility  Bed Mobility Overal bed mobility: Needs Assistance;+2 for physical assistance Bed Mobility: Supine to Sit     Supine to sit: Total assist;+2 for physical assistance     General bed mobility comments: No assist given for LEs or elevation of trunk.    Transfers                    Ambulation/Gait                Stairs            Wheelchair Mobility    Modified Rankin (Stroke Patients Only)       Balance Overall balance assessment: Needs assistance;History of Falls Sitting-balance support: No upper extremity supported;Feet supported Sitting balance-Leahy Scale: Zero Sitting balance - Comments: Pt sat EOB 3 minutes with total assist to max occasionally.  Pt made little attempt to hold onto rail to balance herself needing total assist throughout, even with cues to try to correct posture.  Pt leaning posteriorly and to her right onto her elbow.   Postural control: Posterior lean;Right lateral lean                                   Pertinent Vitals/Pain Pain Assessment: Faces Faces Pain Scale: Hurts whole lot Pain Location: left hip Pain Descriptors / Indicators: Grimacing;Guarding;Aching;Sore Pain Intervention(s): Premedicated before session;Monitored  during session;Limited activity within patient's tolerance;Repositioned  HR to 145 bpm with sitting.      Home Living Family/patient expects to be discharged to:: Skilled nursing facility (was in memory care unit)                      Prior Function Level of Independence: Needs assistance   Gait / Transfers Assistance Needed: Unsure  ADL's / Homemaking Assistance Needed: pt states she needed help  Comments: Pt confused.  Chart reports pt was in memory care unit.      Hand Dominance        Extremity/Trunk Assessment   Upper Extremity Assessment: Defer to OT  evaluation           Lower Extremity Assessment: LLE deficits/detail;RLE deficits/detail RLE Deficits / Details: WFL per testing       Communication   Communication: Other (comment) (slurred speech)  Cognition Arousal/Alertness: Lethargic;Suspect due to medications Behavior During Therapy: Anxious Overall Cognitive Status: History of cognitive impairments - at baseline Area of Impairment: Orientation;Memory;Following commands;Safety/judgement;Awareness;Problem solving Orientation Level: Place;Time;Situation;Person   Memory: Decreased short-term memory;Decreased recall of precautions Following Commands: Follows one step commands inconsistently;Follows one step commands with increased time Safety/Judgement: Decreased awareness of safety;Decreased awareness of deficits Awareness:  (pre-intellectual) Problem Solving: Slow processing;Decreased initiation;Difficulty sequencing;Requires verbal cues;Requires tactile cues      General Comments      Exercises        Assessment/Plan    PT Assessment Patient needs continued PT services  PT Diagnosis Generalized weakness;Acute pain   PT Problem List Decreased activity tolerance;Decreased balance;Decreased mobility;Decreased knowledge of use of DME;Decreased safety awareness;Decreased knowledge of precautions;Pain;Decreased strength;Decreased range of motion  PT Treatment Interventions DME instruction;Gait training;Functional mobility training;Therapeutic activities;Therapeutic exercise;Balance training;Patient/family education   PT Goals (Current goals can be found in the Care Plan section) Acute Rehab PT Goals Patient Stated Goal: unable to state PT Goal Formulation: Patient unable to participate in goal setting Time For Goal Achievement: 10/12/14 Potential to Achieve Goals: Fair    Frequency Min 3X/week   Barriers to discharge Decreased caregiver support      Co-evaluation               End of Session Equipment  Utilized During Treatment: Gait belt;Oxygen Activity Tolerance: Patient limited by fatigue;Patient limited by lethargy;Patient limited by pain Patient left: in bed;with call bell/phone within reach;with bed alarm set Nurse Communication: Mobility status;Need for lift equipment         Time: 1610-9604 PT Time Calculation (min) (ACUTE ONLY): 14 min   Charges:   PT Evaluation $Initial PT Evaluation Tier I: 1 Procedure     PT G CodesBerline Lopes 25-Oct-2014, 12:13 PM Hayze Gazda,PT Acute Rehabilitation [phone] (pager)

## 2014-09-28 NOTE — Progress Notes (Addendum)
[facility] PROGRESS NOTE  Ellen [name] ZOX:[id number] DOB: 1926/03/31 DOA: 09/25/2014 PCP: [name], MD  Assessment/Plan: Active Problems:   Iron deficiency anemia   Coronary atherosclerosis   GERD   Essential hypertension, benign   Closed left hip fracture   Hip fracture   Atrial fibrillation with RVR   Atrial fibrillation with rapid ventricular response-currently heart rate continued to be elevated in the 120s at rest, currently off   Cardizem drip  , increase by mouth Cardizem to 240 mg by mouth daily,  patient received 1 dose of digoxin 0.25 mg IV for better rate control, will repeat another dose of 0.5 mg of digoxin today. A. fib uncontrolled likely in the setting of uncontrolled pain as the patient is not asking for any pain medication due to her dementia. Holding nadolol, currently not on anticoagulation given age and high fall risk, serial cardiac enzymes negative 3, TSH within normal limits, replete electrolytes, magnesium 1.8, keep magnesium greater than 2.0. Recent 2-D echo 55-60%, no regional wall motion abnormalities, pulmonary hypertension. Cardiology consultation requested for better rate control, cardiology is to see patient today. Discussed plan of care with nursing.  Transaminitis-improving, AST ALT mildly elevated, bilirubin normal, DC Tylenol, Tylenol level less than 10, unclear etiology, negative acute hepatitis panel, right upper quadrant ultrasound pending, discontinued pravastatin    Iron deficiency anemia-patient received aranesp at cancer center. Hemoglobin stable postoperatively .   Coronary atherosclerosis-continue telemetry, cardiac enzymes negative, last seen by Dr. Verdis Prime and asked to follow prn . Reconsult cardiology because of atrial fibrillation which is still uncontrolled postoperatively.   GERD-continue PPI   Essential hypertension, benign, controlled-continue IV and by mouth Cardizem, holding Cozaar and   nadolol   Closed left hip fracture-patient seen by Dr. Eldred Manges, MD.  status post left hemiarthroplasty on 8/9   High perioperative risk given age, history of coronary artery disease, oxygen dependent COPD, atrial fibrillation. The risk of surgery was discussed with the daughter and she would like to proceed  Advanced dementia-patient is from a memory care unit, discussed with daughter, she would like the patient to go to SNF, and find another memory care as she was not happy with the one the patient came from. FLO2 signed  Code Status:      Code Status Orders        Start     Ordered   09/25/14 1722  Full code   Continuous     09/25/14 1723    Advance Directive Documentation        Most Recent Value   Type of Advance Directive  Healthcare Power of Attorney, Living will   Pre-existing out of facility DNR order (yellow form or pink MOST form)     "MOST" Form in Place?       Family Communication: family updated about patient's clinical progress Disposition Plan:  Patient will need SNF, PT eval pending   Brief narrative: 81 year old female with a history of oxygen dependent COPD, allergic rhinitis, coronary artery disease, iron deficiency anemia, gastroesophageal reflux disease, seen in the ER on 7/31, after a fall at a memory care facility. She did not lose consciousness. According to the staff and EMS the patient was back to her baseline and was discharged back to the facility. She was brought back in by EMS today on 8/7 with complaints of left hip pain for 2 days. No recurrent falls since 7/31.She was also noted to have bruising at  her right distal forearm yesterday. She has a history of atrial fibrillation not on anticoagulation and was found to be in rapid A. fib with a heart rate of 132 for which she was placed on a Cardizem drip. Patient transferred to [facility] for orthopedic consultation for displaced, non-comminuted fracture of L femoral neck on imaging.Daughter at  bedside, POA. Daughter states she ambulates with walker on her own. Sundowns almost every night. Had a fall 7 days ago, found on floor. Was able to walk all week , no other falls. Last 24 hrs hip short and externally rotated with apparent pain attempting to ambulate. Has lost weight more than 20 lbs with dementia  Consultants:  Orthopedics  Procedures:  None  Antibiotics: Anti-infectives    Start     Dose/Rate Route Frequency Ordered Stop   09/26/14 1540  ceFAZolin (ANCEF) 2-3 GM-% IVPB SOLR    Comments:  [place] Fus   : cabinet override      09/26/14 1540 09/26/14 1555         HPI/Subjective: Patient nonverbal, blood pressure soft post surgery, off Cardizem drip  Objective: Filed Vitals:   09/28/14 0000 09/28/14 0400 09/28/14 0500 09/28/14 0729  BP: 132/60 132/73 113/65 130/64  Pulse: 114 112  96  Temp:  96.7 F (35.9 C)  97 F (36.1 C)  TempSrc:  Axillary  Axillary  Resp: 24 22  30   Height:      Weight:      SpO2: 100% 98%  98%    Intake/Output Summary (Last 24 hours) at 09/28/14 1007 Last data filed at 09/28/14 0448  Gross per 24 hour  Intake   2065 ml  Output    300 ml  Net   1765 ml    Exam:  General: No acute respiratory distress Lungs: Clear to auscultation bilaterally without wheezes or crackles Cardiovascular: Regular rate and rhythm without murmur gallop or rub normal S1 and S2 Abdomen: Nontender, nondistended, soft, bowel sounds positive, no rebound, no ascites, no appreciable mass Extremities: No significant cyanosis, clubbing, or edema bilateral lower extremities     Data Review   Micro Results Recent Results (from the past 240 hour(s))  MRSA PCR Screening     Status: None   Collection Time: 09/25/14  3:26 PM  Result Value Ref Range Status   MRSA by PCR NEGATIVE NEGATIVE Final    Comment:        The GeneXpert MRSA Assay (FDA approved for NASAL specimens only), is one component of a comprehensive MRSA colonization surveillance  program. It is not intended to diagnose MRSA infection nor to guide or monitor treatment for MRSA infections.   Urine culture     Status: None (Preliminary result)   Collection Time: 09/26/14  8:17 AM  Result Value Ref Range Status   Specimen Description URINE, CATHETERIZED  Final   Special Requests NONE  Final   Culture NO GROWTH 1 DAY  Final   Report Status PENDING  Incomplete    Radiology Reports Dg Chest 1 View  09/25/2014   CLINICAL DATA:  Pt states she fell at nursing home 2 days ago. States chronic sob. Denies chest pain. Hx of cabg.  EXAM: CHEST  1 VIEW  COMPARISON:  09/06/2013  FINDINGS: Changes from CABG surgery are stable. Cardiac silhouette is mildly enlarged. No mediastinal or hilar masses or convincing adenopathy.  Lungs show areas of reticular nodular opacity reflecting dilated partly mucous filled bronchi. These findings are stable. There is elevation  of left hemidiaphragm, also stable.  No acute lung consolidation and no evidence of pulmonary edema. No pleural effusion or pneumothorax.  Bony thorax is demineralized but grossly intact.  IMPRESSION: 1. No acute cardiopulmonary disease.   Electronically Signed   By: Amie Portland M.D.   On: 09/25/2014 10:32   Dg Thoracic Spine 2 View  09/18/2014   CLINICAL DATA:  Acute upper thoracic pain following fall today. Initial encounter.  EXAM: THORACIC SPINE 2 VIEWS  COMPARISON:  07/04/2014 and prior chest radiographs  FINDINGS: Normal alignment is noted.  There is no evidence of acute fracture or subluxation.  Mild multilevel degenerative disc disease identified.  No focal bony lesions are identified.  CABG changes are again noted.  IMPRESSION: No evidence of acute bony abnormality.   Electronically Signed   By: Harmon Pier M.D.   On: 09/18/2014 08:17   Dg Shoulder Right  09/18/2014   CLINICAL DATA:  Unwitnessed fall.  Shoulder pain  EXAM: RIGHT SHOULDER - 2+ VIEW  COMPARISON:  None.  FINDINGS: Glenohumeral joint is intact. No evidence  of scapular fracture or humeral fracture. The acromioclavicular joint is intact.  IMPRESSION: No fracture or dislocation.   Electronically Signed   By: Genevive Bi M.D.   On: 09/18/2014 08:16   Ct Head Wo Contrast  09/18/2014   CLINICAL DATA:  Unwitnessed fall, now with posterior neck pain.  EXAM: CT HEAD WITHOUT CONTRAST  CT CERVICAL SPINE WITHOUT CONTRAST  TECHNIQUE: Multidetector CT imaging of the head and cervical spine was performed following the standard protocol without intravenous contrast. Multiplanar CT image reconstructions of the cervical spine were also generated.  COMPARISON:  Head CT - 05/18/2012  FINDINGS: CT HEAD FINDINGS  Examination is minimally degraded secondary to patient motion, necessitating the acquisition of additional images.  Similar findings of advanced atrophy with sulcal prominence and centralized volume loss with commensurate ex vacuo dilatation the ventricular system. Scattered periventricular hypodensities compatible with microvascular ischemic disease. Unchanged punctate calcification in the right basal ganglia. Given extensive background parenchymal abnormalities, there is no CT evidence of superimposed acute large territory infarct. No intraparenchymal or extra-axial mass or hemorrhage. Unchanged size and configuration of the ventricles and basilar cisterns. No midline shift. Intracranial atherosclerosis.  Regional soft tissues appear normal. Post bilateral cataract surgery. Limited visualization the paranasal sinuses and mastoid air cells is normal. No air-fluid levels. No displaced calvarial fracture.  CT CERVICAL SPINE FINDINGS  C1 to the superior endplate of T3 is imaged.  There is minimal (approximately 1-2 mm) of anterolisthesis of C3 upon C4 and C7 upon T1. The dens is normally positioned between the lateral masses of C1. Normal atlantodental and atlantoaxial articulations. The bilateral facets are normally aligned.  No fracture or static subluxation of cervical  spine. Cervical vertebral body heights are preserved. Prevertebral soft tissues are normal.  Mild to moderate multilevel cervical spine DDD, worse at C5-C6 and C6-C7 with disc space height loss, endplate irregularity and small posteriorly directed disc osteophyte complexes at these locations. There is partial ossification the nuchal ligament posterior to the C3 spinous process.  Atherosclerotic plaque within the bilateral carotid bulbs. There is mild diffuse heterogeneity of the thyroid parenchyma without discrete nodule on this noncontrast examination. No bulky cervical lymphadenopathy on this noncontrast examination.  Limited visualization of lung apices demonstrates ill-defined slightly nodular opacities within the imaged portions of the bilateral lung apices (representative images 75 and 83, series 7).  IMPRESSION: 1. Similar findings of advanced atrophy and microvascular ischemic  disease without acute intracranial process. 2. No fracture or static subluxation of the cervical spine. 3. Mild to moderate multilevel cervical spine DDD, worse at C5-C6 and C6-C7.   Electronically Signed   By: Simonne Come M.D.   On: 09/18/2014 08:13   Ct Cervical Spine Wo Contrast  09/18/2014   CLINICAL DATA:  Unwitnessed fall, now with posterior neck pain.  EXAM: CT HEAD WITHOUT CONTRAST  CT CERVICAL SPINE WITHOUT CONTRAST  TECHNIQUE: Multidetector CT imaging of the head and cervical spine was performed following the standard protocol without intravenous contrast. Multiplanar CT image reconstructions of the cervical spine were also generated.  COMPARISON:  Head CT - 05/18/2012  FINDINGS: CT HEAD FINDINGS  Examination is minimally degraded secondary to patient motion, necessitating the acquisition of additional images.  Similar findings of advanced atrophy with sulcal prominence and centralized volume loss with commensurate ex vacuo dilatation the ventricular system. Scattered periventricular hypodensities compatible with  microvascular ischemic disease. Unchanged punctate calcification in the right basal ganglia. Given extensive background parenchymal abnormalities, there is no CT evidence of superimposed acute large territory infarct. No intraparenchymal or extra-axial mass or hemorrhage. Unchanged size and configuration of the ventricles and basilar cisterns. No midline shift. Intracranial atherosclerosis.  Regional soft tissues appear normal. Post bilateral cataract surgery. Limited visualization the paranasal sinuses and mastoid air cells is normal. No air-fluid levels. No displaced calvarial fracture.  CT CERVICAL SPINE FINDINGS  C1 to the superior endplate of T3 is imaged.  There is minimal (approximately 1-2 mm) of anterolisthesis of C3 upon C4 and C7 upon T1. The dens is normally positioned between the lateral masses of C1. Normal atlantodental and atlantoaxial articulations. The bilateral facets are normally aligned.  No fracture or static subluxation of cervical spine. Cervical vertebral body heights are preserved. Prevertebral soft tissues are normal.  Mild to moderate multilevel cervical spine DDD, worse at C5-C6 and C6-C7 with disc space height loss, endplate irregularity and small posteriorly directed disc osteophyte complexes at these locations. There is partial ossification the nuchal ligament posterior to the C3 spinous process.  Atherosclerotic plaque within the bilateral carotid bulbs. There is mild diffuse heterogeneity of the thyroid parenchyma without discrete nodule on this noncontrast examination. No bulky cervical lymphadenopathy on this noncontrast examination.  Limited visualization of lung apices demonstrates ill-defined slightly nodular opacities within the imaged portions of the bilateral lung apices (representative images 75 and 83, series 7).  IMPRESSION: 1. Similar findings of advanced atrophy and microvascular ischemic disease without acute intracranial process. 2. No fracture or static subluxation  of the cervical spine. 3. Mild to moderate multilevel cervical spine DDD, worse at C5-C6 and C6-C7.   Electronically Signed   By: Simonne Come M.D.   On: 09/18/2014 08:13   Dg Chest Port 1 View  09/28/2014   CLINICAL DATA:  Initial encounter for worsening shortness of breath today.  EXAM: PORTABLE CHEST - 1 VIEW  COMPARISON:  09/25/2014.  FINDINGS: 0835 hours. Low volume film. No overt airspace pulmonary edema or focal lung consolidation. There is atelectasis at the left lung base. Interstitial markings are diffusely coarsened with chronic features. The cardio pericardial silhouette is enlarged. Patient is status post CABG. Telemetry leads overlie the chest. Bones are diffusely demineralized.  IMPRESSION: Low volume film without acute cardiopulmonary findings.   Electronically Signed   By: Kennith Center M.D.   On: 09/28/2014 08:43   Dg Hip Port Unilat With Pelvis 1v Left  09/26/2014   CLINICAL DATA:  81 year old female  status post left hip replacement  EXAM: DG HIP (WITH OR WITHOUT PELVIS) 1V PORT LEFT  COMPARISON:  Preoperative radiographs 09/25/2014  FINDINGS: Interval left hip hemi arthroplasty. No evidence of acute abnormality. Expected postoperative subcutaneous emphysema. The visualized bony pelvis and right femur appear intact.  IMPRESSION: Surgical changes of left hip hemi arthroplasty without evidence of immediate complication.   Electronically Signed   By: Malachy Moan M.D.   On: 09/26/2014 18:37   Dg Hip Unilat With Pelvis 2-3 Views Left  09/25/2014   CLINICAL DATA:  Larey Seat 2 days ago at the nursing home. Left hip pain since.  EXAM: DG HIP (WITH OR WITHOUT PELVIS) 2-3V LEFT  COMPARISON:  None.  FINDINGS: There is a displaced, non comminuted fracture of the left femoral neck. This is in mid cervical femoral neck fracture. Shaft component has retracted superiorly displacing 15 mm. There is also apex anterior angulation of approximately 30 degrees as well as significant varus angulation.  There are  no other fractures. Bones are diffusely demineralized. There is superior lateral hip joint space narrowing on the left.  Soft tissues demonstrate vascular calcifications but are otherwise unremarkable.  IMPRESSION: 1. Displaced, non comminuted fracture of the left femoral neck with varus and apex anterior angulation.   Electronically Signed   By: Amie Portland M.D.   On: 09/25/2014 10:29     CBC  Recent Labs Lab 09/25/14 1050 09/25/14 1841 09/27/14 0426 09/28/14 0305  WBC 12.2* 11.2* 9.8 6.9  HGB 13.6 13.8 11.8* 11.2*  HCT 41.6 41.6 37.4 36.0  PLT 288 290 300 231  MCV 90.6 90.4 93.3 94.5  MCH 29.6 30.0 29.4 29.4  MCHC 32.7 33.2 31.6 31.1  RDW 13.6 13.4 13.6 13.6  LYMPHSABS 0.8  --   --   --   MONOABS 0.8  --   --   --   EOSABS 0.0  --   --   --   BASOSABS 0.0  --   --   --     Chemistries   Recent Labs Lab 09/25/14 1050 09/25/14 1841 09/27/14 0426 09/28/14 0305  NA 132*  --  134* 137  K 4.1  --  4.9 4.7  CL 96*  --  97* 103  CO2 26  --  28 28  GLUCOSE 148*  --  91 86  BUN 17  --  19 18  CREATININE 0.62 0.70 0.88 0.73  CALCIUM 9.0  --  8.4* 8.2*  MG  --  1.8  --   --   AST  --   --  152* 61*  ALT  --   --  151* 70*  ALKPHOS  --   --  198* 154*  BILITOT  --   --  1.1 1.0   ------------------------------------------------------------------------------------------------------------------ estimated creatinine clearance is 39.9 mL/min (by C-G formula based on Cr of 0.73). ------------------------------------------------------------------------------------------------------------------ No results for input(s): HGBA1C in the last 72 hours. ------------------------------------------------------------------------------------------------------------------ No results for input(s): CHOL, HDL, LDLCALC, TRIG, CHOLHDL, LDLDIRECT in the last 72 hours. ------------------------------------------------------------------------------------------------------------------  Recent Labs   09/25/14 1841  TSH 0.914   ------------------------------------------------------------------------------------------------------------------ No results for input(s): VITAMINB12, FOLATE, FERRITIN, TIBC, IRON, RETICCTPCT in the last 72 hours.  Coagulation profile  Recent Labs Lab 09/25/14 1050 09/27/14 0935  INR 1.19 1.21    No results for input(s): DDIMER in the last 72 hours.  Cardiac Enzymes  Recent Labs Lab 09/25/14 1841 09/25/14 2336 09/26/14 0534  TROPONINI 0.03 <0.03 <0.03   ------------------------------------------------------------------------------------------------------------------ Invalid input(s):  POCBNP   CBG:  Recent Labs Lab 09/28/14 0727  GLUCAP 74       Studies: Dg Chest Port 1 View  09/28/2014   CLINICAL DATA:  Initial encounter for worsening shortness of breath today.  EXAM: PORTABLE CHEST - 1 VIEW  COMPARISON:  09/25/2014.  FINDINGS: 0835 hours. Low volume film. No overt airspace pulmonary edema or focal lung consolidation. There is atelectasis at the left lung base. Interstitial markings are diffusely coarsened with chronic features. The cardio pericardial silhouette is enlarged. Patient is status post CABG. Telemetry leads overlie the chest. Bones are diffusely demineralized.  IMPRESSION: Low volume film without acute cardiopulmonary findings.   Electronically Signed   By: Kennith Center M.D.   On: 09/28/2014 08:43   Dg Hip Port Unilat With Pelvis 1v Left  09/26/2014   CLINICAL DATA:  81 year old female status post left hip replacement  EXAM: DG HIP (WITH OR WITHOUT PELVIS) 1V PORT LEFT  COMPARISON:  Preoperative radiographs 09/25/2014  FINDINGS: Interval left hip hemi arthroplasty. No evidence of acute abnormality. Expected postoperative subcutaneous emphysema. The visualized bony pelvis and right femur appear intact.  IMPRESSION: Surgical changes of left hip hemi arthroplasty without evidence of immediate complication.   Electronically Signed    By: Malachy Moan M.D.   On: 09/26/2014 18:37      No results found for: HGBA1C Lab Results  Component Value Date   CREATININE 0.73 09/28/2014       Scheduled Meds: . ALPRAZolam  0.25 mg Oral QHS  . antiseptic oral rinse  7 mL Mouth Rinse BID  . aspirin EC  325 mg Oral Q breakfast  . digoxin  0.25 mg Intravenous Once  . diltiazem  180 mg Oral Daily  . diltiazem  5 mg Intravenous Once  . docusate sodium  100 mg Oral BID  . enoxaparin (LOVENOX) injection  40 mg Subcutaneous Q24H  . feeding supplement (ENSURE ENLIVE)  237 mL Oral BID BM  . memantine  10 mg Oral Daily  . pantoprazole  40 mg Oral Daily  . potassium chloride SA  20 mEq Oral Daily  . sertraline  50 mg Oral Daily  . sodium chloride  3 mL Intravenous Q12H   Continuous Infusions: . sodium chloride 20 mL (09/28/14 0409)  . lactated ringers Stopped (09/27/14 1700)    Active Problems:   Iron deficiency anemia   Coronary atherosclerosis   GERD   Essential hypertension, benign   Closed left hip fracture   Hip fracture   Atrial fibrillation with RVR    Time spent: 45 minutes   [facility]  [facility] Pager [phone]. If 7PM-7AM, please contact night-coverage at [email or web address], password [email or web address] 09/28/2014, 10:07 AM  LOS: 3 days

## 2014-09-28 NOTE — Progress Notes (Signed)
SLP Cancellation Note  Patient Details Name: Charisa Twitty MRN: [id number] DOB: 06-Sep-1926   Cancelled treatment:       Reason Eval/Treat Not Completed: Patient at procedure or test/unavailable. Will return as able.   Maxcine Ham, M.A. CCC-SLP [phone]  Maxcine Ham 09/28/2014, 9:55 AM

## 2014-09-28 NOTE — Clinical Social Work Note (Signed)
Clinical Social Work Assessment  Patient Details  Name: Ellen [name] MRN: [id number] Date of Birth: [birth year]  Date of referral:  09/28/14               Reason for consult:  Facility Placement                Permission sought to share information with:  Family Supports Permission granted to share information::   (Daughter Debbie Dispensing optician)  Name::     Ruthann Cancer daughter Recruitment consultant::  SNF admissions  Relationship::     Contact Information:     Housing/Transportation Living arrangements for the past 2 months:  Assisted Dealer of Information:  Adult Children, Power of Attorney Patient Interpreter Needed:  None Criminal Activity/Legal Involvement Pertinent to Current Situation/Hospitalization:  No - Comment as needed Significant Relationships:  Adult Children Lives with:  Facility Resident Do you feel safe going back to the place where you live?  No (Patient's daughter would like short term rehab and then possible long term care placement at SNF) Need for family participation in patient care:  Yes (Comment) (Patient has dementia)  Care giving concerns:  Patient's daughter feels patient will probably need to go to a SNF for short term rehab and then transition to long term care at SNF because of her medical needs.   Social Worker assessment / plan:  Patient has dementia and CSW was not able to assess patient.  CSW spoke to patient's daughter, who states patient's dementia has slowly becoming worse.  Patient was sleeping during assessment, CSW received information from patient's daughter.  Patient has been living at [facility], and has gradually gotten worse.  Patient is only alert to person, unaware of place, situation, or time.  Patient's daughter states that she feels patient needs more care now and was thinking she may be look at long term care at a SNF.  Patient's daughter asked questions about long term care Medicaid, insurance payment for rehab,  and Elder Law contact information.  Patient's daughter is health care power of attorney.  Employment status:  Retired Database administrator PT Recommendations:  Skilled Nursing Facility Information / Referral to community resources:     Patient/Family's Response to care:  Patient's daughter in agreement to going to SNF for short term rehab and then possible transition to long term care.  Patient/Family's Understanding of and Emotional Response to Diagnosis, Current Treatment, and Prognosis:  Patient's daughter aware of diagnosis and treatment plan, she did not express any concerns.  Emotional Assessment Appearance:  Appears stated age Attitude/Demeanor/Rapport:  Unable to Assess Affect (typically observed):  Unable to Assess (Patient was sleeping) Orientation:  Fluctuating Orientation (Suspected and/or reported Sundowners) Alcohol / Substance use:  Not Applicable Psych involvement (Current and /or in the community):  No (Comment)  Discharge Needs  Concerns to be addressed:  Other (Comment Required (Patient needs short term rehab and will probably transition to long term care at [facility]) Readmission within the last 30 days:  No Current discharge risk:  Cognitively Impaired Barriers to Discharge:  Insurance Authorization   [place] Constable 09/28/2014, 6:19 PM

## 2014-09-28 NOTE — Clinical Social Work Note (Signed)
CSW spoke with patient's daughter [name] [phone].  Patient is from [facility], however, patient's daughter feels patient will need more care once short term rehab has been completed.  Patient's daughter is considering long term care at a SNF after she receives short term rehab.  CSW spoke to patient's daughter about contacting DSS about applying for long term care medicaid, if she is thinking about having patient stay at a SNF for long term.  Patient's daughter was also given list of SNFs for short term rehab.  Patient has been faxed out awaiting bed offers and insurance approval.  CSW to continue to follow patient's progress throughout discharge planning.  Ervin Knack. Mercedez Boule, MSW, Theresia Majors [phone] 09/28/2014 6:14 PM

## 2014-09-28 NOTE — Progress Notes (Signed)
Speech Language Pathology Treatment: Dysphagia  Patient Details Name: Ellen [name] MRN: [id number] DOB: [birth year] Today's Date: 09/28/2014 Time: 7829-5621 SLP Time Calculation (min) (ACUTE ONLY): 12 min  Assessment / Plan / Recommendation Clinical Impression  Pt remains limited by current mentation and lethargy, which impact her ability to orally manipulate and transfer even pureed boluses. Thin liquids are tolerated well, with swift swallow trigger and no overt signs of aspiration. Small bites of puree are orally held, and require Mod cueing for what appears to be piecemeal transit. She requires frequent stimulation for alertness and sustained attention. Recommend to continue with current diet given how effortful pureed solids are at this time. Will continue to follow for readiness to advance solids.   HPI Other Pertinent Information: Pt is an 81 y.o. female with PMH oxygen dependent COPD, allergic rhinitis, coronary artery disease, iron deficiency anemia, gastroesophageal reflux disease, seen in the ER on 7/31, after a fall at a memory care facility. She did not lose consciousness. According to the staff and EMS the patient was back to her baseline and was discharged back to the facility. She was brought back in by EMS today on 8/7 with complaints of left hip pain for 2 days. No recurrent falls since 7/31.She was also noted to have bruising at her right distal forearm yesterday. She has a history of atrial fibrillation not on anticoagulation and was found to be in rapid A. fib with a heart rate of 132. Pt sundowns almost every night. Pt has reportedly lost more than 20 lbs with dementia. Pt had surgery for L hip fracture on 8/8. CXR 8/7 showed no acute disease. Bedside swallow eval ordered.   Pertinent Vitals Pain Assessment: Faces Faces Pain Scale: Hurts little more Pain Location: L hip Pain Descriptors / Indicators: Grimacing Pain Intervention(s): Limited activity within patient's  tolerance;Monitored during session  SLP Plan  Continue with current plan of care    Recommendations Diet recommendations: Thin liquid (full liquid) Liquids provided via: Cup;Straw Medication Administration: Crushed with puree Supervision: Staff to assist with self feeding;Full supervision/cueing for compensatory strategies Compensations: Slow rate;Small sips/bites Postural Changes and/or Swallow Maneuvers: Seated upright 90 degrees       Oral Care Recommendations: Oral care BID Follow up Recommendations: Skilled Nursing facility Plan: Continue with current plan of care    Maxcine Ham, M.A. CCC-SLP [phone]  Maxcine Ham 09/28/2014, 3:31 PM

## 2014-09-28 NOTE — Evaluation (Signed)
Occupational Therapy Evaluation Patient Details Name: Ellen [name] MRN: [id number] DOB: 10/19/1926 Today's Date: 09/28/2014    History of Present Illness Pt is an 81 y.o. female with PMH oxygen dependent COPD, allergic rhinitis, coronary artery disease, iron deficiency anemia, gastroesophageal reflux disease, seen in the ER on 7/31, after a fall at a memory care facility. She did not lose consciousness. According to the staff and EMS the patient was back to her baseline and was discharged back to the facility. She was brought back in by EMS today on 8/7 with complaints of left hip pain for 2 days. No recurrent falls since 7/31.She was also noted to have bruising at her right distal forearm yesterday. She has a history of atrial fibrillation not on anticoagulation and was found to be in rapid A. fib with a heart rate of 132. Pt sundowns almost every night. Pt has reportedly lost more than 20 lbs with dementia. Pt had surgery for L hip fracture on 8/8. CXR 8/7 showed no acute disease. Bedside swallow eval ordered.    Clinical Impression   Pt was assisted with ADL prior to admission, could self-feed per her report.  Pt with baseline memory impairment, but is pleasant and agreeable.  She has advanced macular degeneration per RN.  Pt keeping her eyes closed for much of session. Pt presents with L LE pain, generalized weakness and poor balance.  She is dependent in all mobility and ADL currently and will need SNF upon discharge. Will defer further OT to SNF.    Follow Up Recommendations  SNF;Supervision/Assistance - 24 hour    Equipment Recommendations       Recommendations for Other Services       Precautions / Restrictions Precautions Precautions: Fall;Posterior Hip Precaution Booklet Issued: Yes (comment) Required Braces or Orthoses: Knee Immobilizer - Left Restrictions Weight Bearing Restrictions: Yes LLE Weight Bearing: Weight bearing as tolerated      Mobility Bed Mobility Overal  bed mobility: Needs Assistance;+2 for physical assistance Bed Mobility: Supine to Sit;Sit to Supine     Supine to sit: Total assist;+2 for physical assistance Sit to supine: +2 for physical assistance;Total assist     Transfers                      Balance Overall balance assessment: Needs assistance;History of Falls Sitting-balance support: No upper extremity supported;Feet supported Sitting balance-Leahy Scale: Zero Postural control: Posterior lean                                  ADL                                         General ADL Comments: Pt requiring total assist for all ADL.  Assisted this visit in eating lunch meal and performing bed level grooming.     Vision     Perception     Praxis      Pertinent Vitals/Pain Pain Assessment: Faces Faces Pain Scale: Hurts little more Pain Location: L hip Pain Descriptors / Indicators: Grimacing;Guarding Pain Intervention(s): Limited activity within patient's tolerance;Premedicated before session;Monitored during session;Repositioned     Hand Dominance Right   Extremity/Trunk Assessment Upper Extremity Assessment Upper Extremity Assessment: Generalized weakness   Lower Extremity Assessment Lower Extremity Assessment: Defer to PT evaluation RLE Deficits / Details:  WFL per testing LLE: Unable to fully assess due to pain       Communication Communication Communication: No difficulties   Cognition Arousal/Alertness: Lethargic;Suspect due to medications Behavior During Therapy: [facility] for tasks assessed/performed Overall Cognitive Status: History of cognitive impairments - at baseline Area of Impairment: Orientation;Memory;Following commands;Safety/judgement;Awareness;Problem solving Orientation Level: Place;Time;Situation;Person   Memory: Decreased short-term memory;Decreased recall of precautions Following Commands: Follows one step commands inconsistently;Follows one step  commands with increased time Safety/Judgement: Decreased awareness of safety;Decreased awareness of deficits Awareness:  (pre-intellectual) Problem Solving: Slow processing;Decreased initiation;Difficulty sequencing;Requires verbal cues;Requires tactile cues     General Comments       Exercises       Shoulder Instructions      Home Living Family/patient expects to be discharged to:: Skilled nursing facility                                 Additional Comments: pt resides in a memory care      Prior Functioning/Environment Level of Independence: Needs assistance  Gait / Transfers Assistance Needed: Unsure ADL's / Homemaking Assistance Needed: pt states she needed help for bathing and dressing, self feeds   Comments: Pt is a poor historian, hx of impaired cognition.    OT Diagnosis: Generalized weakness;Cognitive deficits;Acute pain;Blindness and low vision   OT Problem List: Decreased strength;Decreased activity tolerance;Impaired balance (sitting and/or standing);Impaired vision/perception;Decreased cognition;Cardiopulmonary status limiting activity;Pain;Impaired UE functional use;Decreased knowledge of use of DME or AE   OT Treatment/Interventions:      OT Goals(Current goals can be found in the care plan section) Acute Rehab OT Goals Patient Stated Goal: unable to state  OT Frequency:     Barriers to D/C:            Co-evaluation              End of Session Nurse Communication:  (pt's vision)  Activity Tolerance: Patient limited by lethargy Patient left: in bed;with call bell/phone within reach;with bed alarm set   Time: 1300-1336 OT Time Calculation (min): 36 min Charges:  OT General Charges $OT Visit: 1 Procedure OT Evaluation $Initial OT Evaluation Tier I: 1 Procedure OT Treatments $Self Care/Home Management : 8-22 mins G-Codes:    Evern Bio 09/28/2014, 2:24 PM  [phone]

## 2014-09-28 NOTE — Clinical Social Work Placement (Signed)
   CLINICAL SOCIAL WORK PLACEMENT  NOTE  Date:  09/28/2014  Patient Details  Name: Keturah Yerby MRN: [id number] Date of Birth: 08/10/1926  Clinical Social Work is seeking post-discharge placement for this patient at the Skilled  Nursing Facility level of care (*CSW will initial, date and re-position this form in  chart as items are completed):  Yes   Patient/family provided with [facility] Clinical Social Work Department's list of facilities offering this level of care within the geographic area requested by the patient (or if unable, by the patient's family).  Yes   Patient/family informed of their freedom to choose among providers that offer the needed level of care, that participate in Medicare, Medicaid or managed care program needed by the patient, have an available bed and are willing to accept the patient.  Yes   Patient/family informed of [facility]'s ownership interest in [facility] and [facility], as well as of the fact that they are under no obligation to receive care at these facilities.  PASRR submitted to EDS on 09/28/14     PASRR number received on 09/28/14     Existing PASRR number confirmed on       FL2 transmitted to all facilities in geographic area requested by pt/family on       FL2 transmitted to all facilities within larger geographic area on 09/28/14     Patient informed that his/her managed care company has contracts with or will negotiate with certain facilities, including the following:            Patient/family informed of bed offers received.  Patient chooses bed at       Physician recommends and patient chooses bed at      Patient to be transferred to   on  .  Patient to be transferred to facility by       Patient family notified on   of transfer.  Name of family member notified:        PHYSICIAN Please sign FL2     Additional Comment:    _______________________________________________ Darleene Cleaver, LCSWA 09/28/2014,  6:29 PM

## 2014-09-28 NOTE — Consult Note (Signed)
CARDIOLOGY CONSULT NOTE   Patient ID: Ellen [name] MRN: [id number] DOB/AGE: [birth year] 79 y.o.  Admit date: 09/25/2014  Primary Physician   Shamleffer, Landry Mellow, MD Primary Cardiologist   Dr. Katrinka Blazing Reason for Consultation  A.fib with RVR  HPI: Ellen [name] is a 81 y.o. female with a history of CAD s/p CABG, HTN, chronic Afib (diagnosied 04/2014), COPD, dementia, iron deficiency anemia, macular degeneration who admitted  09/25/14 with left hip fracture and afib with RVR.   She was doing well on cardiac stand point of view when last seen by Dr. Katrinka Blazing 05/2014. Not on anticoagulation. She is on ASA 325mg  daily. Cardiology f/u as needed.   She was seen in the ER on 7/31, after a unwitnessed fall at a memory care facility. She did not lose consciousness. According to the staff and EMS the patient was back to her baseline and was discharged back to the facility.   She was brought back in by EMS today on 8/7 with complaints of left hip pain for 2 days. No recurrent falls since 7/3, was able to walk. Patient transferred to [facility] for orthopedic consultation for displaced, non-comminuted fracture of L femoral neck s/p left hemiarthroplasty on 8/9.   She was also found to be in afib with RVR on admission. Started on cardizem drip, held nadolol. Not on anticoagulation given age and high fall risk. Rate improved, discontinued cardizem drip -> short acting diltiazem--> given Cardizem CD 180mg  today and plan for 240mg  tomorrow. Patient has received 1 dose of digoxin 0.5 mg IV for better rate control today, will repeat another dose of 0.25 mg of digoxin today. It was felt that A. fib uncontrolled likely in the setting of uncontrolled pain as the patient is not asking for any pain medication due to her dementia.   Patient also had elevated transaminates, improving. Currently patient complains of heel pain. No family at bed side. Demented.    Past Medical History  Diagnosis Date  .  Chronic airway obstruction, not elsewhere classified   . Allergic rhinitis, cause unspecified   . Esophageal reflux   . Iron deficiency anemia, unspecified   . Coronary atherosclerosis of unspecified type of vessel, native or graft   . Dysfunction of eustachian tube   . Obesity, unspecified   . Macular degeneration, right eye Being treated.  . Macular degeneration, left eye Blind      Past Surgical History  Procedure Laterality Date  . Coronary artery bypass graft    . Hip arthroplasty Left 09/26/2014    Procedure: ARTHROPLASTY MONOPOLAR HIP (HEMIARTHROPLASTY);  Surgeon: Eldred Manges, MD;  Location: [facility] OR;  Service: Orthopedics;  Laterality: Left;    Allergies  Allergen Reactions  . Doxycycline Other (See Comments)    Destroyed good bacteria in GI tract  . Clarithromycin Other (See Comments)    H/A .[name]..patient unsure of reaction    . Prinivil [Lisinopril] Other (See Comments)    Patient unsure of reaction  . Propulsid [Cisapride] Other (See Comments)    Patient unsure of reaction    I have reviewed the patient's current medications . ALPRAZolam  0.25 mg Oral QHS  . antiseptic oral rinse  7 mL Mouth Rinse BID  . aspirin EC  325 mg Oral Q breakfast  . digoxin  0.25 mg Intravenous Once  . [START ON 09/29/2014] diltiazem  240 mg Oral Daily  . diltiazem  5 mg Intravenous Once  . docusate sodium  100 mg Oral BID  .  enoxaparin (LOVENOX) injection  40 mg Subcutaneous Q24H  . feeding supplement (ENSURE ENLIVE)  237 mL Oral BID BM  . memantine  10 mg Oral Daily  . pantoprazole  40 mg Oral Daily  . potassium chloride SA  20 mEq Oral Daily  . sertraline  50 mg Oral Daily  . sodium chloride  3 mL Intravenous Q12H   . sodium chloride 20 mL (09/28/14 0409)  . lactated ringers Stopped (09/27/14 1700)   levalbuterol, menthol-cetylpyridinium **OR** phenol, metoCLOPramide **OR** metoCLOPramide (REGLAN) injection, ondansetron **OR** ondansetron (ZOFRAN) IV, oxyCODONE  Prior to Admission  medications   Medication Sig Start Date End Date Taking? Authorizing Provider  aspirin EC 325 MG tablet Take 1 tablet (325 mg total) by mouth daily. 05/05/14  Yes Dwana Melena, PA-C  Calcium-Vitamin D-Vitamin K (CALCIUM SOFT CHEWS PO) Take 2 tablets by mouth daily.   Yes Historical Provider, MD  cholecalciferol (VITAMIN D) 1000 UNITS tablet Take 2,000 Units by mouth daily.   Yes Historical Provider, MD  esomeprazole (NEXIUM) 40 MG capsule Take 20 mg by mouth daily.    Yes Historical Provider, MD  furosemide (LASIX) 20 MG tablet Take 1 tablet (20 mg total) by mouth daily. 05/06/14  Yes Lyn Records, MD  losartan (COZAAR) 50 MG tablet Take 50 mg by mouth daily.   Yes Historical Provider, MD  memantine (NAMENDA XR) 28 MG CP24 24 hr capsule Take 28 mg by mouth daily.   Yes Historical Provider, MD  Multiple Vitamins-Minerals (EYE VITAMINS) CAPS Take 1 capsule by mouth daily.    Yes Historical Provider, MD  nadolol (CORGARD) 80 MG tablet Take 80 mg by mouth at bedtime.    Yes Historical Provider, MD  NITROSTAT 0.4 MG SL tablet Place 0.4 mg under the tongue every 5 (five) minutes as needed for chest pain. Use as directed as needed 11/29/12  Yes Historical Provider, MD  potassium chloride SA (KLOR-CON M20) 20 MEQ tablet Take 1 tablet (20 mEq total) by mouth daily. 05/05/14  Yes Dwana Melena, PA-C  pravastatin (PRAVACHOL) 20 MG tablet Take 20 mg by mouth at bedtime.    Yes Historical Provider, MD  sertraline (ZOLOFT) 50 MG tablet Take 50 mg by mouth daily.   Yes Historical Provider, MD  ALPRAZolam (XANAX) 0.25 MG tablet Take 1 tablet (0.25 mg total) by mouth at bedtime. 09/28/14   Richarda Overlie, MD  diltiazem (CARDIZEM CD) 240 MG 24 hr capsule Take 1 capsule (240 mg total) by mouth daily. 09/28/14   Richarda Overlie, MD  docusate sodium (COLACE) 100 MG capsule Take 1 capsule (100 mg total) by mouth 2 (two) times daily. 09/28/14   Richarda Overlie, MD  oxyCODONE (OXY IR/ROXICODONE) 5 MG immediate release tablet Take 1  tablet (5 mg total) by mouth every 6 (six) hours as needed for moderate pain. 09/28/14   Richarda Overlie, MD  traMADol (ULTRAM) 50 MG tablet Take 1 tablet (50 mg total) by mouth every 6 (six) hours as needed for moderate pain. 09/28/14   Richarda Overlie, MD     Social History   Social History  . Marital Status: Widowed    Spouse Name: N/A  . Number of Children: N/A  . Years of Education: N/A   Occupational History  . Retired    Social History Main Topics  . Smoking status: Never Smoker   . Smokeless tobacco: Never Used     Comment: Never used tobacco  . Alcohol Use: Not on file  . Drug  Use: Not on file  . Sexual Activity: Not on file   Other Topics Concern  . Not on file   Social History Narrative    Family Status  Relation Status Death Age  . Father Deceased   . Mother Deceased    Family History  Problem Relation Age of Onset  . Colon cancer Brother   . Breast cancer Sister   . Heart disease Father   . Hypertension Father   . Heart disease Mother   . Hypertension Mother      ROS:  Full 14 point review of systems complete and found to be negative unless listed above.  Physical Exam: Blood pressure 137/69, pulse 107, temperature 97.7 F (36.5 C), temperature source Oral, resp. rate 17, height  (1.575 m), weight 129 lb 13.6 oz (58.9 kg), SpO2 98 %.  General: Ill appearing , female in no acute distress Head: Eyes PERRLA, No xanthomas. Normocephalic and atraumatic, oropharynx without edema or exudate.  Lungs: Resp regular and unlabored, CTA. Heart: irregular tachycardic no s3, s4, or murmurs..   Neck: No carotid bruits. No lymphadenopathy or JVD. Abdomen: Bowel sounds present, abdomen soft and non-tender. Msk:  Move all extremities spontaneously.  Extremities: No clubbing, cyanosis or edema. DP 2+ and equal bilaterally. Compression stocking in place.  Neuro: Alert and oriented X 3. No focal deficits noted. Psych:  Good affect, responds appropriately Skin: No rashes  or lesions noted.  Labs:   Lab Results  Component Value Date   WBC 6.9 09/28/2014   HGB 11.2* 09/28/2014   HCT 36.0 09/28/2014   MCV 94.5 09/28/2014   PLT 231 09/28/2014    Recent Labs  09/27/14 0935  INR 1.21    Recent Labs Lab 09/28/14 0305  NA 137  K 4.7  CL 103  CO2 28  BUN 18  CREATININE 0.73  CALCIUM 8.2*  PROT 5.4*  BILITOT 1.0  ALKPHOS 154*  ALT 70*  AST 61*  GLUCOSE 86  ALBUMIN 2.5*   MAGNESIUM  Date Value Ref Range Status  09/25/2014 1.8 1.7 - 2.4 mg/dL Final    Recent Labs  16/10/96 1841 09/25/14 2336 09/26/14 0534  TROPONINI 0.03 <0.03 <0.03   TSH  Date/Time Value Ref Range Status  09/25/2014 06:41 PM 0.914 0.350 - 4.500 uIU/mL Final   Echo: LV EF: 55% -  60%  ------------------------------------------------------------------- Indications:   I48.91 Atrial Fibrillation.  ------------------------------------------------------------------- History:  PMH: Acquired from the patient and from the patient&'s chart. PMH: Venous Insufficiency. CAD. Dementia. COPD. Dyspnea. Risk factors: Hypertension.  ------------------------------------------------------------------- Study Conclusions  - Left ventricle: The cavity size was normal. Systolic function was normal. The estimated ejection fraction was in the range of 55% to 60%. Wall motion was normal; there were no regional wall motion abnormalities. - Aortic valve: Trileaflet; normal thickness, mildly calcified leaflets. There was mild to moderate regurgitation. - Aorta: There is moderate focal calcification of the Aorta at the sinuses of Valsalva. No history in the chart of aortic root surgery or AV surgery in the past. - Mitral valve: There was mild to moderate regurgitation. - Left atrium: The atrium was severely dilated. - Tricuspid valve: There was mild-moderate regurgitation. - Pulmonic valve: There was trivial regurgitation. - Pulmonary arteries: PA peak  pressure: 37 mm Hg (S).  Impressions:  - The right ventricular systolic pressure was increased consistent with mild pulmonary hypertension.  ECG: Afib with Vent. rate 95 BPM PR interval * ms QRS duration 70 ms QT/QTc 366/459 ms P-R-T  axes * 17 41  Radiology:  Dg Chest Port 1 View  09/28/2014   CLINICAL DATA:  Initial encounter for worsening shortness of breath today.  EXAM: PORTABLE CHEST - 1 VIEW  COMPARISON:  09/25/2014.  FINDINGS: 0835 hours. Low volume film. No overt airspace pulmonary edema or focal lung consolidation. There is atelectasis at the left lung base. Interstitial markings are diffusely coarsened with chronic features. The cardio pericardial silhouette is enlarged. Patient is status post CABG. Telemetry leads overlie the chest. Bones are diffusely demineralized.  IMPRESSION: Low volume film without acute cardiopulmonary findings.   Electronically Signed   By: Kennith Center M.D.   On: 09/28/2014 08:43   Dg Hip Port Unilat With Pelvis 1v Left  09/26/2014   CLINICAL DATA:  81 year old female status post left hip replacement  EXAM: DG HIP (WITH OR WITHOUT PELVIS) 1V PORT LEFT  COMPARISON:  Preoperative radiographs 09/25/2014  FINDINGS: Interval left hip hemi arthroplasty. No evidence of acute abnormality. Expected postoperative subcutaneous emphysema. The visualized bony pelvis and right femur appear intact.  IMPRESSION: Surgical changes of left hip hemi arthroplasty without evidence of immediate complication.   Electronically Signed   By: Malachy Moan M.D.   On: 09/26/2014 18:37   US Abdomen Limited Ruq  09/28/2014   CLINICAL DATA:  Right upper quadrant abdominal pain.  EXAM: US ABDOMEN LIMITED - RIGHT UPPER QUADRANT  COMPARISON:  CT scan of March 09, 2009.  FINDINGS: Gallbladder:  Status post cholecystectomy.  Common bile duct:  Diameter: 5.3 mm which is within normal limits.  Liver:  Heterogeneous echotexture of hepatic parenchyma is noted. 1.6 cm cyst is seen in left  hepatic lobe. 8 mm cyst is seen anteriorly in right hepatic lobe. 1.8 cm echogenic focus is seen in left hepatic lobe most consistent with hemangioma in the absence of any history of primary malignancy.  IMPRESSION: Status post cholecystectomy. No evidence of intrahepatic or extrahepatic biliary dilatation.  Multiple small hepatic cysts are noted. Heterogeneous echotexture of hepatic parenchyma is noted suggesting diffuse hepatocellular disease such as hepatitis.  1.8 mm echogenic focus seen anteriorly in left hepatic lobe ; in the absence of any history of malignancy, this most likely represents benign hemangioma, and followup ultrasound in 6 months would be recommended to ensure stability. If the patient has a history of malignancy, then MRI would be recommended.   Electronically Signed   By: Lupita Raider, M.D.   On: 09/28/2014 11:02    ASSESSMENT AND PLAN:      1. Afib with RVR - CHADSVACs score of at least 5 (age, sex, HTN, vascular disease). Not on anticoagulation given age, dementia and high fall risk.  Will aim for rate control.  - she was also found to be in afib with RVR on admission. Started on cardizem drip, held nadolol. Rate improved, discontinued cardizem drip -> short acting diltiazem--> given Cardizem CD 180mg  today and plan for 240mg  tomorrow. Patient has received 1 dose of digoxin 0.5 mg IV for better rate control today, will repeat another dose of 0.25 mg of digoxin today. It was felt that A. fib uncontrolled likely in the setting of uncontrolled pain as the patient is not asking for any pain medication due to her dementia.  - Currently rate in 90-110s, at times elevates to 140s with PVCs. Seems digoxin helped to control rate better.  - Echo 05/09/14 showed LV EF of 55-60%, no WM abnormality, mild to moderate aortic ad mitral regurgitation, PA peak pressure: 37  mm Hg (S). Appears euvolemic.  - Will reduce her ASA to 81mg  and give extra Cardizem CD 120mg  today. Plan to resume Cardizem  CD 240mg  tomorrow.   2. CAD - S/p remote CABG per Dr. Michaelle Copas note. Cardiac enzymes negative. No chest pain.   3. HTN- Relatively stable. Continue Cardizem. Held Cozzar and nadolol on admission. She was hypotensive on admission.   4. Transaminitis -improving, AST ALT mildly elevated, bilirubin normal, Tylenol level less than 10, negative acute hepatitis panel, right upper quadrant ultrasound pending.  -Discontinued pravastatin   5. Iron deficiency anemia -Patient received aranesp. Hemoglobin stable postoperatively.  6. Closed left hip fracture - S/p left hemiarthroplasty 09/27/14  7. Advanced dementia - SNF placement on discharge   Signed: Bhagat,Bhavinkumar, PA 09/28/2014, 2:05 PM Pager [phone]  Co-Sign MD History and all data above reviewed.  Patient examined.  I agree with the findings as above.  The patient exam reveals Neck: R carotid bruit.  No JVD  COR: Irregularly irregular ,  Lungs: CTAB ,  Abd: soft, NT, ND, Ext Warm.  2+ DP/PT bilaterally  No edema .  All available labs, radiology testing, previous records reviewed. Agree with documented assessment and plan. Ellen [name] is currently anticoagulated with aspirin.  Could consider a NOAC once stable from a post-surgical standpoint, as there is higher efficacy for stroke prevention and a better bleeding profile.  A R carotid bruit was noted on exam.  Consider carotid ultrasound as an outpatient if family would want to pursue CEA/stenting.  May be more reasonable to titrate her medical therapy for PVD and CAD by adding a statin when LFTs normalize.  Patient is unable to have this discussion 2/2 dementia.    Chilton Si P  3:32 PM  09/28/2014

## 2014-09-28 NOTE — Care Management Important Message (Signed)
Important Message  Patient Details  Name: Ellen [name] MRN: [id number] Date of Birth: [birth year]   Medicare Important Message Given:  Yes-second notification given    Yvonna Alanis 09/28/2014, 9:55 AM

## 2014-09-29 DIAGNOSIS — R7989 Other specified abnormal findings of blood chemistry: Secondary | ICD-10-CM

## 2014-09-29 LAB — CBC
HCT: 36.4 % (ref 36.0–46.0)
HEMOGLOBIN: 11.4 g/dL — AB (ref 12.0–15.0)
MCH: 30.2 pg (ref 26.0–34.0)
MCHC: 31.3 g/dL (ref 30.0–36.0)
MCV: 96.3 fL (ref 78.0–100.0)
Platelets: 205 10*3/uL (ref 150–400)
RBC: 3.78 MIL/uL — ABNORMAL LOW (ref 3.87–5.11)
RDW: 13.4 % (ref 11.5–15.5)
WBC: 7 10*3/uL (ref 4.0–10.5)

## 2014-09-29 LAB — BASIC METABOLIC PANEL
Anion gap: 8 (ref 5–15)
BUN: 9 mg/dL (ref 6–20)
CALCIUM: 8.3 mg/dL — AB (ref 8.9–10.3)
CO2: 28 mmol/L (ref 22–32)
CREATININE: 0.57 mg/dL (ref 0.44–1.00)
Chloride: 101 mmol/L (ref 101–111)
GFR calc Af Amer: >60 mL/min (ref >60)
GLUCOSE: 88 mg/dL (ref 65–99)
Potassium: 5 mmol/L (ref 3.5–5.1)
Sodium: 137 mmol/L (ref 135–145)

## 2014-09-29 LAB — GLUCOSE, CAPILLARY: GLUCOSE-CAPILLARY: 90 mg/dL (ref 65–99)

## 2014-09-29 NOTE — Progress Notes (Signed)
Subjective:  No chest pain  Objective:   Vital Signs : Filed Vitals:   09/29/14 0028 09/29/14 0406 09/29/14 0736 09/29/14 1000  BP: 118/53 125/55 142/57 116/42  Pulse: 103 108 102 128  Temp: 98.3 F (36.8 C) 99.2 F (37.3 C) 97.9 F (36.6 C)   TempSrc: Axillary Axillary Axillary   Resp: _0 32  Height:      Weight:      SpO2: 97% 98% 99% 97%    Intake/Output from previous day:  Intake/Output Summary (Last 24 hours) at 09/29/14 1138 Last data filed at 09/29/14 0700  Gross per 24 hour  Intake 1170.83 ml  Output   1100 ml  Net  70.83 ml    I/O since admission: +3408  Wt Readings from Last 3 Encounters:  09/25/14 129 lb 13.6 oz (58.9 kg)  07/07/14 145 lb (65.772 kg)  07/04/14 146 lb 12.8 oz (66.588 kg)    Medications: . ALPRAZolam  0.25 mg Oral QHS  . antiseptic oral rinse  7 mL Mouth Rinse BID  . aspirin EC  81 mg Oral Q breakfast  . digoxin  0.25 mg Intravenous Once  . diltiazem  240 mg Oral Daily  . diltiazem  5 mg Intravenous Once  . docusate sodium  100 mg Oral BID  . enoxaparin (LOVENOX) injection  40 mg Subcutaneous Q24H  . feeding supplement (ENSURE ENLIVE)  237 mL Oral BID BM  . memantine  10 mg Oral Daily  . pantoprazole  40 mg Oral Daily  . potassium chloride SA  20 mEq Oral Daily  . sertraline  50 mg Oral Daily  . sodium chloride  3 mL Intravenous Q12H    . sodium chloride 20 mL (09/28/14 0409)  . lactated ringers Stopped (09/27/14 1700)    Physical Exam:   General appearance: Sleepy, not alert Neck: no adenopathy, no JVD and supple, symmetrical, trachea midline Lungs: no wheezing Heart: irregularly irregular rhythm; 1/6 sem Abdomen: soft, non-tender; bowel sounds normal; no masses,  no organomegaly Extremities: no edema, redness or tenderness in the calves or thighs Neurologic: Grossly normal   Rate: 120  Rhythm: atrial fibrillation  ECG (independently read by me): A Fib at 129; NSSTT changes  Lab Results:   Recent Labs  09/27/14 0426 09/28/14 0305 09/29/14 0355  NA 134* 137 137  K 4.9 4.7 5.0  CL 97* 103 101  CO2 _1 GLUCOSE 91 86 88  BUN _2 CREATININE 0.88 0.73 0.57  CALCIUM 8.4* 8.2* 8.3*    Hepatic Function Latest Ref Rng 09/28/2014 09/27/2014 07/07/2014  Total Protein 6.5 - 8.1 g/dL 5.4(L) 5.8(L) 6.8  Albumin 3.5 - 5.0 g/dL 2.5(L) 2.6(L) 4.0  AST 15 - 41 U/L 61(H) 152(H) 26  ALT 14 - 54 U/L 70(H) 151(H) 26  Alk Phosphatase 38 - 126 U/L 154(H) 198(H) 89  Total Bilirubin 0.3 - 1.2 mg/dL 1.0 1.1 0.6     Recent Labs  09/27/14 0426 09/28/14 0305 09/29/14 0355  WBC 9.8 6.9 7.0  HGB 11.8* 11.2* 11.4*  HCT 37.4 36.0 36.4  MCV 93.3 94.5 96.3  PLT 300 231 205    No results for input(s): TROPONINI in the last 72 hours.  Invalid input(s): CK, MB  Lab Results  Component Value Date   TSH 0.914 09/25/2014   No results for input(s): HGBA1C in the last 72 hours.    Recent Labs  09/27/14 0935  INR 1.21   BNP (last  3 results) No results for input(s): BNP in the last 8760 hours.  ProBNP (last 3 results) No results for input(s): PROBNP in the last 8760 hours.   Lipid Panel  No results found for: CHOL, TRIG, HDL, CHOLHDL, VLDL, LDLCALC, LDLDIRECT   05/09/2014 Echo Study Conclusions  - Left ventricle: The cavity size was normal. Systolic function was normal. The estimated ejection fraction was in the range of 55% to 60%. Wall motion was normal; there were no regional wall motion abnormalities. - Aortic valve: Trileaflet; normal thickness, mildly calcified leaflets. There was mild to moderate regurgitation. - Aorta: There is moderate focal calcification of the Aorta at the sinuses of Valsalva. No history in the chart of aortic root surgery or AV surgery in the past. - Mitral valve: There was mild to moderate regurgitation. - Left atrium: The atrium was severely dilated. - Tricuspid valve: There was mild-moderate regurgitation. - Pulmonic valve: There was  trivial regurgitation. - Pulmonary arteries: PA peak pressure: 37 mm Hg (S).  Impressions:  - The right ventricular systolic pressure was increased consistent with mild pulmonary hypertension.     Imaging:  Dg Chest Port 1 View  09/28/2014   CLINICAL DATA:  Initial encounter for worsening shortness of breath today.  EXAM: PORTABLE CHEST - 1 VIEW  COMPARISON:  09/25/2014.  FINDINGS: 0835 hours. Low volume film. No overt airspace pulmonary edema or focal lung consolidation. There is atelectasis at the left lung base. Interstitial markings are diffusely coarsened with chronic features. The cardio pericardial silhouette is enlarged. Patient is status post CABG. Telemetry leads overlie the chest. Bones are diffusely demineralized.  IMPRESSION: Low volume film without acute cardiopulmonary findings.   Electronically Signed   By: Misty Stanley M.D.   On: 09/28/2014 08:43   US Abdomen Limited Ruq  09/28/2014   CLINICAL DATA:  Right upper quadrant abdominal pain.  EXAM: US ABDOMEN LIMITED - RIGHT UPPER QUADRANT  COMPARISON:  CT scan of March 09, 2009.  FINDINGS: Gallbladder:  Status post cholecystectomy.  Common bile duct:  Diameter: 5.3 mm which is within normal limits.  Liver:  Heterogeneous echotexture of hepatic parenchyma is noted. 1.6 cm cyst is seen in left hepatic lobe. 8 mm cyst is seen anteriorly in right hepatic lobe. 1.8 cm echogenic focus is seen in left hepatic lobe most consistent with hemangioma in the absence of any history of primary malignancy.  IMPRESSION: Status post cholecystectomy. No evidence of intrahepatic or extrahepatic biliary dilatation.  Multiple small hepatic cysts are noted. Heterogeneous echotexture of hepatic parenchyma is noted suggesting diffuse hepatocellular disease such as hepatitis.  1.8 mm echogenic focus seen anteriorly in left hepatic lobe ; in the absence of any history of malignancy, this most likely represents benign hemangioma, and followup ultrasound in 6  months would be recommended to ensure stability. If the patient has a history of malignancy, then MRI would be recommended.   Electronically Signed   By: Marijo Conception, M.D.   On: 09/28/2014 11:02      Assessment/Plan:   Active Problems:   Iron deficiency anemia   Coronary atherosclerosis   GERD   Essential hypertension, benign   Closed left hip fracture   Hip fracture   Atrial fibrillation with RVR   1. Afib with RVR:   - HR today 110 - 120 even when sleeping. - CHADSVACs score of at least 5 (age, sex, HTN, vascular disease). Not on anticoagulation given age, dementia and high fall risk. - she was also found  to be in afib with RVR on admission.  Will increase cardizem to 240 mg daily, continue lanoxin, and if rate still remains increased then add beta-blocker. - Echo 05/09/14 showed LV EF of 55-60%, no WM abnormality, mild to moderate aortic and mitral regurgitation, PA peak pressure: 37 mm Hg (S). Appears euvolemic.    2. CAD - S/p remote CABG per Dr. Thompson Caul note. Cardiac enzymes neg54ative. No chest pain.   3. HTN- Relatively stable. Continue Cardizem. BP stable at 116/54  4. Transaminitis -improving, AST ALT mildly elevated, bilirubin normal, Tylenol level less than 10, negative acute hepatitis panel, right upper quadrant ultrasound pending. Keep off statin presently   5. Iron deficiency anemia -Patient received aranesp. Hemoglobin stable postoperatively.  6. Closed left hip fracture - S/p left hemiarthroplasty 09/27/14  7. Advanced dementia - SNF placement on discharge; not an anticoagulation candidate.  8. Carotid bruit -Consider outpatient duplex      Troy Sine, MD, [facility] 09/29/2014, 11:38 AM

## 2014-09-29 NOTE — Progress Notes (Addendum)
Patient Demographics  Ellen [name], is a 81 y.o. female, DOB - 10-27-1926, ZOX:[id number]  Admit date - 09/25/2014   Admitting Physician Jerald Kief, MD  Outpatient Primary MD for the patient is Shamleffer, Landry Mellow, MD  LOS - 4   Chief Complaint  Patient presents with  . Hip Pain       Admission HPI/Brief narrative: 81 year old female with a history of oxygen dependent COPD, coronary artery disease, iron deficiency anemia, gastroesophageal reflux disease, with known history of A. fib, not on anticoagulation secondary to fall , presents on 8/7 with right hip pain status post fall found in A. fib with RVR , he should be on Cardizem drip, transitioned to oral Cardizem, status post hemiarthroplasty 09/27/14.  Subjective:   Ellen [name] today has, No headache, No chest pain, No abdominal pain - no shortness of breath no cough .  Assessment & Plan    Active Problems:   Iron deficiency anemia   Coronary atherosclerosis   GERD   Essential hypertension, benign   Closed left hip fracture   Hip fracture   Atrial fibrillation with RVR  A. fib with RVR - Initially requiring Cardizem drip, significant improvement after initiation of oral Cardizem ( dose increased from 120 mg  To 240 mg by cardiology), continue with digoxin, and telemetry monitor. -  CHADSVACs score of  5 , Not on anticoagulation given age, dementia and high fall risk. - Echo 05/09/14 showed LV EF of 55-60%, no WM abnormality, mild to moderate aortic and mitral regurgitation - We'll discontinue potassium supplement as potassium is 5.  Left hip fracture - Status post left hemiarthroplasty on 09/27/14 by Dr. Ophelia Charter - on  when necessary nausea and pain medication - PT following  Hypertension - Blood pressure acceptable, continue with Cardizem  Coronary artery disease - Denies any chest pain or shortness of breath - negative  troponins  Dementia  - Continue with Namenda  Elevated LFTs - Improving, evidence of hepatitis and ultrasound, and hemangioma, repeat ultrasound in 6 months.  GERD - Continue with PPI.  Carotid bruits - Carotid Doppler as an outpatient  Code Status: full  Family Communication: None at bedside  Disposition Plan: SNF when stable  Procedures  - Status post left hemiarthroplasty on 09/27/14 by Dr. Macon Large   Orthopedic Cardiology   Medications  Scheduled Meds: . ALPRAZolam  0.25 mg Oral QHS  . antiseptic oral rinse  7 mL Mouth Rinse BID  . aspirin EC  81 mg Oral Q breakfast  . digoxin  0.25 mg Intravenous Once  . diltiazem  240 mg Oral Daily  . diltiazem  5 mg Intravenous Once  . docusate sodium  100 mg Oral BID  . enoxaparin (LOVENOX) injection  40 mg Subcutaneous Q24H  . feeding supplement (ENSURE ENLIVE)  237 mL Oral BID BM  . memantine  10 mg Oral Daily  . pantoprazole  40 mg Oral Daily  . potassium chloride SA  20 mEq Oral Daily  . sertraline  50 mg Oral Daily  . sodium chloride  3 mL Intravenous Q12H   Continuous Infusions: . sodium chloride 20 mL (09/28/14 0409)  . lactated ringers Stopped (09/27/14 1700)   PRN Meds:.levalbuterol, menthol-cetylpyridinium **OR** phenol, metoCLOPramide **  OR** metoCLOPramide (REGLAN) injection, ondansetron **OR** ondansetron (ZOFRAN) IV, oxyCODONE  DVT Prophylaxis  Lovenox -  Lab Results  Component Value Date   PLT 205 09/29/2014   Antibiotics    Anti-infectives    Start     Dose/Rate Route Frequency Ordered Stop   09/26/14 1540  ceFAZolin (ANCEF) 2-3 GM-% IVPB SOLR    Comments:  Cato, Sarah   : cabinet override      09/26/14 1540 09/26/14 1555          Objective:   Filed Vitals:   09/29/14 1200 09/29/14 1306 09/29/14 1600 09/29/14 1610  BP: 125/44 121/47 143/57 143/57  Pulse:  90 91 91  Temp:  98.2 F (36.8 C)  97.1 F (36.2 C)  TempSrc:  Axillary  Axillary  Resp: 22 23 23 26   Height:        Weight:      SpO2:  98% 100% 99%    Wt Readings from Last 3 Encounters:  09/25/14 58.9 kg (129 lb 13.6 oz)  07/07/14 65.772 kg (145 lb)  07/04/14 66.588 kg (146 lb 12.8 oz)     Intake/Output Summary (Last 24 hours) at 09/29/14 1639 Last data filed at 09/29/14 0700  Gross per 24 hour  Intake 1170.83 ml  Output   1100 ml  Net  70.83 ml     Physical Exam  Awake Alert, confused  [place].AT,PERRAL Supple Neck,No JVD,  Symmetrical Chest wall movement, Good air movement bilaterally,  irregular,No Gallops,Rubs or new Murmurs, No Parasternal Heave +ve B.Sounds, Abd Soft, No tenderness, No organomegaly appriciated,  No Cyanosis, Clubbing or edema,    Data Review   Micro Results Recent Results (from the past 240 hour(s))  MRSA PCR Screening     Status: None   Collection Time: 09/25/14  3:26 PM  Result Value Ref Range Status   MRSA by PCR NEGATIVE NEGATIVE Final    Comment:        The GeneXpert MRSA Assay (FDA approved for NASAL specimens only), is one component of a comprehensive MRSA colonization surveillance program. It is not intended to diagnose MRSA infection nor to guide or monitor treatment for MRSA infections.   Urine culture     Status: None   Collection Time: 09/26/14  8:17 AM  Result Value Ref Range Status   Specimen Description URINE, CATHETERIZED  Final   Special Requests NONE  Final   Culture 20,000 COLONIES/mL LACTOBACILLUS SPECIES  Final   Report Status 09/28/2014 FINAL  Final    Radiology Reports Dg Chest 1 View  09/25/2014   CLINICAL DATA:  Pt states she fell at nursing home 2 days ago. States chronic sob. Denies chest pain. Hx of cabg.  EXAM: CHEST  1 VIEW  COMPARISON:  09/06/2013  FINDINGS: Changes from CABG surgery are stable. Cardiac silhouette is mildly enlarged. No mediastinal or hilar masses or convincing adenopathy.  Lungs show areas of reticular nodular opacity reflecting dilated partly mucous filled bronchi. These findings are stable. There is  elevation of left hemidiaphragm, also stable.  No acute lung consolidation and no evidence of pulmonary edema. No pleural effusion or pneumothorax.  Bony thorax is demineralized but grossly intact.  IMPRESSION: 1. No acute cardiopulmonary disease.   Electronically Signed   By: Amie Portland M.D.   On: 09/25/2014 10:32   Dg Thoracic Spine 2 View  09/18/2014   CLINICAL DATA:  Acute upper thoracic pain following fall today. Initial encounter.  EXAM: THORACIC SPINE 2 VIEWS  COMPARISON:  07/04/2014 and prior chest radiographs  FINDINGS: Normal alignment is noted.  There is no evidence of acute fracture or subluxation.  Mild multilevel degenerative disc disease identified.  No focal bony lesions are identified.  CABG changes are again noted.  IMPRESSION: No evidence of acute bony abnormality.   Electronically Signed   By: Harmon Pier M.D.   On: 09/18/2014 08:17   Dg Shoulder Right  09/18/2014   CLINICAL DATA:  Unwitnessed fall.  Shoulder pain  EXAM: RIGHT SHOULDER - 2+ VIEW  COMPARISON:  None.  FINDINGS: Glenohumeral joint is intact. No evidence of scapular fracture or humeral fracture. The acromioclavicular joint is intact.  IMPRESSION: No fracture or dislocation.   Electronically Signed   By: Genevive Bi M.D.   On: 09/18/2014 08:16   Ct Head Wo Contrast  09/18/2014   CLINICAL DATA:  Unwitnessed fall, now with posterior neck pain.  EXAM: CT HEAD WITHOUT CONTRAST  CT CERVICAL SPINE WITHOUT CONTRAST  TECHNIQUE: Multidetector CT imaging of the head and cervical spine was performed following the standard protocol without intravenous contrast. Multiplanar CT image reconstructions of the cervical spine were also generated.  COMPARISON:  Head CT - 05/18/2012  FINDINGS: CT HEAD FINDINGS  Examination is minimally degraded secondary to patient motion, necessitating the acquisition of additional images.  Similar findings of advanced atrophy with sulcal prominence and centralized volume loss with commensurate ex vacuo  dilatation the ventricular system. Scattered periventricular hypodensities compatible with microvascular ischemic disease. Unchanged punctate calcification in the right basal ganglia. Given extensive background parenchymal abnormalities, there is no CT evidence of superimposed acute large territory infarct. No intraparenchymal or extra-axial mass or hemorrhage. Unchanged size and configuration of the ventricles and basilar cisterns. No midline shift. Intracranial atherosclerosis.  Regional soft tissues appear normal. Post bilateral cataract surgery. Limited visualization the paranasal sinuses and mastoid air cells is normal. No air-fluid levels. No displaced calvarial fracture.  CT CERVICAL SPINE FINDINGS  C1 to the superior endplate of T3 is imaged.  There is minimal (approximately 1-2 mm) of anterolisthesis of C3 upon C4 and C7 upon T1. The dens is normally positioned between the lateral masses of C1. Normal atlantodental and atlantoaxial articulations. The bilateral facets are normally aligned.  No fracture or static subluxation of cervical spine. Cervical vertebral body heights are preserved. Prevertebral soft tissues are normal.  Mild to moderate multilevel cervical spine DDD, worse at C5-C6 and C6-C7 with disc space height loss, endplate irregularity and small posteriorly directed disc osteophyte complexes at these locations. There is partial ossification the nuchal ligament posterior to the C3 spinous process.  Atherosclerotic plaque within the bilateral carotid bulbs. There is mild diffuse heterogeneity of the thyroid parenchyma without discrete nodule on this noncontrast examination. No bulky cervical lymphadenopathy on this noncontrast examination.  Limited visualization of lung apices demonstrates ill-defined slightly nodular opacities within the imaged portions of the bilateral lung apices (representative images 75 and 83, series 7).  IMPRESSION: 1. Similar findings of advanced atrophy and microvascular  ischemic disease without acute intracranial process. 2. No fracture or static subluxation of the cervical spine. 3. Mild to moderate multilevel cervical spine DDD, worse at C5-C6 and C6-C7.   Electronically Signed   By: Simonne Come M.D.   On: 09/18/2014 08:13   Ct Cervical Spine Wo Contrast  09/18/2014   CLINICAL DATA:  Unwitnessed fall, now with posterior neck pain.  EXAM: CT HEAD WITHOUT CONTRAST  CT CERVICAL SPINE WITHOUT CONTRAST  TECHNIQUE: Multidetector CT imaging of the  head and cervical spine was performed following the standard protocol without intravenous contrast. Multiplanar CT image reconstructions of the cervical spine were also generated.  COMPARISON:  Head CT - 05/18/2012  FINDINGS: CT HEAD FINDINGS  Examination is minimally degraded secondary to patient motion, necessitating the acquisition of additional images.  Similar findings of advanced atrophy with sulcal prominence and centralized volume loss with commensurate ex vacuo dilatation the ventricular system. Scattered periventricular hypodensities compatible with microvascular ischemic disease. Unchanged punctate calcification in the right basal ganglia. Given extensive background parenchymal abnormalities, there is no CT evidence of superimposed acute large territory infarct. No intraparenchymal or extra-axial mass or hemorrhage. Unchanged size and configuration of the ventricles and basilar cisterns. No midline shift. Intracranial atherosclerosis.  Regional soft tissues appear normal. Post bilateral cataract surgery. Limited visualization the paranasal sinuses and mastoid air cells is normal. No air-fluid levels. No displaced calvarial fracture.  CT CERVICAL SPINE FINDINGS  C1 to the superior endplate of T3 is imaged.  There is minimal (approximately 1-2 mm) of anterolisthesis of C3 upon C4 and C7 upon T1. The dens is normally positioned between the lateral masses of C1. Normal atlantodental and atlantoaxial articulations. The bilateral  facets are normally aligned.  No fracture or static subluxation of cervical spine. Cervical vertebral body heights are preserved. Prevertebral soft tissues are normal.  Mild to moderate multilevel cervical spine DDD, worse at C5-C6 and C6-C7 with disc space height loss, endplate irregularity and small posteriorly directed disc osteophyte complexes at these locations. There is partial ossification the nuchal ligament posterior to the C3 spinous process.  Atherosclerotic plaque within the bilateral carotid bulbs. There is mild diffuse heterogeneity of the thyroid parenchyma without discrete nodule on this noncontrast examination. No bulky cervical lymphadenopathy on this noncontrast examination.  Limited visualization of lung apices demonstrates ill-defined slightly nodular opacities within the imaged portions of the bilateral lung apices (representative images 75 and 83, series 7).  IMPRESSION: 1. Similar findings of advanced atrophy and microvascular ischemic disease without acute intracranial process. 2. No fracture or static subluxation of the cervical spine. 3. Mild to moderate multilevel cervical spine DDD, worse at C5-C6 and C6-C7.   Electronically Signed   By: Simonne Come M.D.   On: 09/18/2014 08:13   Dg Chest Port 1 View  09/28/2014   CLINICAL DATA:  Initial encounter for worsening shortness of breath today.  EXAM: PORTABLE CHEST - 1 VIEW  COMPARISON:  09/25/2014.  FINDINGS: 0835 hours. Low volume film. No overt airspace pulmonary edema or focal lung consolidation. There is atelectasis at the left lung base. Interstitial markings are diffusely coarsened with chronic features. The cardio pericardial silhouette is enlarged. Patient is status post CABG. Telemetry leads overlie the chest. Bones are diffusely demineralized.  IMPRESSION: Low volume film without acute cardiopulmonary findings.   Electronically Signed   By: Kennith Center M.D.   On: 09/28/2014 08:43   Dg Hip Port Unilat With Pelvis 1v  Left  09/26/2014   CLINICAL DATA:  81 year old female status post left hip replacement  EXAM: DG HIP (WITH OR WITHOUT PELVIS) 1V PORT LEFT  COMPARISON:  Preoperative radiographs 09/25/2014  FINDINGS: Interval left hip hemi arthroplasty. No evidence of acute abnormality. Expected postoperative subcutaneous emphysema. The visualized bony pelvis and right femur appear intact.  IMPRESSION: Surgical changes of left hip hemi arthroplasty without evidence of immediate complication.   Electronically Signed   By: Malachy Moan M.D.   On: 09/26/2014 18:37   Dg Hip Unilat With Pelvis  2-3 Views Left  09/25/2014   CLINICAL DATA:  Larey Seat 2 days ago at the nursing home. Left hip pain since.  EXAM: DG HIP (WITH OR WITHOUT PELVIS) 2-3V LEFT  COMPARISON:  None.  FINDINGS: There is a displaced, non comminuted fracture of the left femoral neck. This is in mid cervical femoral neck fracture. Shaft component has retracted superiorly displacing 15 mm. There is also apex anterior angulation of approximately 30 degrees as well as significant varus angulation.  There are no other fractures. Bones are diffusely demineralized. There is superior lateral hip joint space narrowing on the left.  Soft tissues demonstrate vascular calcifications but are otherwise unremarkable.  IMPRESSION: 1. Displaced, non comminuted fracture of the left femoral neck with varus and apex anterior angulation.   Electronically Signed   By: Amie Portland M.D.   On: 09/25/2014 10:29   US Abdomen Limited Ruq  09/28/2014   CLINICAL DATA:  Right upper quadrant abdominal pain.  EXAM: US ABDOMEN LIMITED - RIGHT UPPER QUADRANT  COMPARISON:  CT scan of March 09, 2009.  FINDINGS: Gallbladder:  Status post cholecystectomy.  Common bile duct:  Diameter: 5.3 mm which is within normal limits.  Liver:  Heterogeneous echotexture of hepatic parenchyma is noted. 1.6 cm cyst is seen in left hepatic lobe. 8 mm cyst is seen anteriorly in right hepatic lobe. 1.8 cm echogenic focus  is seen in left hepatic lobe most consistent with hemangioma in the absence of any history of primary malignancy.  IMPRESSION: Status post cholecystectomy. No evidence of intrahepatic or extrahepatic biliary dilatation.  Multiple small hepatic cysts are noted. Heterogeneous echotexture of hepatic parenchyma is noted suggesting diffuse hepatocellular disease such as hepatitis.  1.8 mm echogenic focus seen anteriorly in left hepatic lobe ; in the absence of any history of malignancy, this most likely represents benign hemangioma, and followup ultrasound in 6 months would be recommended to ensure stability. If the patient has a history of malignancy, then MRI would be recommended.   Electronically Signed   By: Lupita Raider, M.D.   On: 09/28/2014 11:02     CBC  Recent Labs Lab 09/25/14 1050 09/25/14 1841 09/27/14 0426 09/28/14 0305 09/29/14 0355  WBC 12.2* 11.2* 9.8 6.9 7.0  HGB 13.6 13.8 11.8* 11.2* 11.4*  HCT 41.6 41.6 37.4 36.0 36.4  PLT 288 290 300 231 205  MCV 90.6 90.4 93.3 94.5 96.3  MCH 29.6 30.0 29.4 29.4 30.2  MCHC 32.7 33.2 31.6 31.1 31.3  RDW 13.6 13.4 13.6 13.6 13.4  LYMPHSABS 0.8  --   --   --   --   MONOABS 0.8  --   --   --   --   EOSABS 0.0  --   --   --   --   BASOSABS 0.0  --   --   --   --     Chemistries   Recent Labs Lab 09/25/14 1050 09/25/14 1841 09/27/14 0426 09/28/14 0305 09/29/14 0355  NA 132*  --  134* 137 137  K 4.1  --  4.9 4.7 5.0  CL 96*  --  97* 103 101  CO2 26  --  28 28 28   GLUCOSE 148*  --  91 86 88  BUN 17  --  19 18 9   CREATININE 0.62 0.70 0.88 0.73 0.57  CALCIUM 9.0  --  8.4* 8.2* 8.3*  MG  --  1.8  --   --   --   AST  --   --  152* 61*  --   ALT  --   --  151* 70*  --   ALKPHOS  --   --  198* 154*  --   BILITOT  --   --  1.1 1.0  --    ------------------------------------------------------------------------------------------------------------------ estimated creatinine clearance is 39.9 mL/min (by C-G formula based on Cr of  0.57). ------------------------------------------------------------------------------------------------------------------ No results for input(s): HGBA1C in the last 72 hours. ------------------------------------------------------------------------------------------------------------------ No results for input(s): CHOL, HDL, LDLCALC, TRIG, CHOLHDL, LDLDIRECT in the last 72 hours. ------------------------------------------------------------------------------------------------------------------ No results for input(s): TSH, T4TOTAL, T3FREE, THYROIDAB in the last 72 hours.  Invalid input(s): FREET3 ------------------------------------------------------------------------------------------------------------------ No results for input(s): VITAMINB12, FOLATE, FERRITIN, TIBC, IRON, RETICCTPCT in the last 72 hours.  Coagulation profile  Recent Labs Lab 09/25/14 1050 09/27/14 0935  INR 1.19 1.21    No results for input(s): DDIMER in the last 72 hours.  Cardiac Enzymes  Recent Labs Lab 09/25/14 1841 09/25/14 2336 09/26/14 0534  TROPONINI 0.03 <0.03 <0.03   ------------------------------------------------------------------------------------------------------------------ Invalid input(s): POCBNP     Time Spent in minutes   35 minutes   ELGERGAWY, DAWOOD M.D on 09/29/2014 at 4:39 PM  Between 7am to 7pm - Pager - [phone]  After 7pm go to [email or web address] - password [facility]  [facility]   Office  [phone]

## 2014-09-29 NOTE — Progress Notes (Addendum)
2pm [facility] has received insurance auth- it will be good for 48hours.  [facility] will follow up with nursing staff to confirm pt does not have concerning behaviors at this time  11:45am Pt daughter chooses [facility] in [place]- CSW informed facility- they have initiated insurance auth- auth pending.  CSW will continue to follow.  Merlyn Lot, LCSWA Clinical Social Worker [phone]

## 2014-09-30 DIAGNOSIS — I255 Ischemic cardiomyopathy: Secondary | ICD-10-CM

## 2014-09-30 DIAGNOSIS — I270 Primary pulmonary hypertension: Secondary | ICD-10-CM

## 2014-09-30 DIAGNOSIS — I272 Pulmonary hypertension, unspecified: Secondary | ICD-10-CM | POA: Diagnosis present

## 2014-09-30 DIAGNOSIS — S72002A Fracture of unspecified part of neck of left femur, initial encounter for closed fracture: Principal | ICD-10-CM

## 2014-09-30 DIAGNOSIS — I2510 Atherosclerotic heart disease of native coronary artery without angina pectoris: Secondary | ICD-10-CM | POA: Diagnosis present

## 2014-09-30 DIAGNOSIS — I10 Essential (primary) hypertension: Secondary | ICD-10-CM | POA: Diagnosis present

## 2014-09-30 LAB — RETICULOCYTES
RBC.: 3.62 MIL/uL — ABNORMAL LOW (ref 3.87–5.11)
RETIC COUNT ABSOLUTE: 39.8 10*3/uL (ref 19.0–186.0)
Retic Ct Pct: 1.1 % (ref 0.4–3.1)

## 2014-09-30 LAB — BASIC METABOLIC PANEL
Anion gap: 6 (ref 5–15)
CO2: 32 mmol/L (ref 22–32)
CREATININE: 0.5 mg/dL (ref 0.44–1.00)
Calcium: 8.3 mg/dL — ABNORMAL LOW (ref 8.9–10.3)
Chloride: 99 mmol/L — ABNORMAL LOW (ref 101–111)
GFR calc Af Amer: >60 mL/min (ref >60)
Glucose, Bld: 92 mg/dL (ref 65–99)
Potassium: 4.5 mmol/L (ref 3.5–5.1)
Sodium: 137 mmol/L (ref 135–145)

## 2014-09-30 LAB — FOLATE: Folate: 7.9 ng/mL (ref >5.9)

## 2014-09-30 LAB — VITAMIN B12: Vitamin B-12: 312 pg/mL (ref 180–914)

## 2014-09-30 LAB — IRON AND TIBC
IRON: 30 ug/dL (ref 28–170)
Saturation Ratios: 19 % (ref 10.4–31.8)
TIBC: 154 ug/dL — ABNORMAL LOW (ref 250–450)
UIBC: 124 ug/dL

## 2014-09-30 LAB — FERRITIN: FERRITIN: 1566 ng/mL — AB (ref 11–307)

## 2014-09-30 MED ORDER — DILTIAZEM HCL ER COATED BEADS 180 MG PO CP24
360.0000 mg | ORAL_CAPSULE | Freq: Every day | ORAL | Status: DC
  Administered 2014-10-01 – 2014-10-03 (×3): 360 mg via ORAL
  Filled 2014-09-30 (×2): qty 1
  Filled 2014-09-30: qty 2

## 2014-09-30 MED ORDER — DILTIAZEM HCL ER COATED BEADS 120 MG PO CP24
120.0000 mg | ORAL_CAPSULE | Freq: Once | ORAL | Status: AC
  Administered 2014-09-30: 120 mg via ORAL
  Filled 2014-09-30: qty 1

## 2014-09-30 MED ORDER — METOPROLOL TARTRATE 12.5 MG HALF TABLET
12.5000 mg | ORAL_TABLET | Freq: Two times a day (BID) | ORAL | Status: DC
  Administered 2014-09-30 – 2014-10-03 (×6): 12.5 mg via ORAL
  Filled 2014-09-30 (×7): qty 1

## 2014-09-30 NOTE — Progress Notes (Signed)
Physical Therapy Treatment Patient Details Name: Ellen [name] MRN: [id number] DOB: 1926-08-09 Today's Date: 09/30/2014    History of Present Illness Pt is an 81 y.o. female with PMH oxygen dependent COPD, allergic rhinitis, coronary artery disease, iron deficiency anemia, gastroesophageal reflux disease, seen in the ER on 7/31, after a fall at a memory care facility. She did not lose consciousness. According to the staff and EMS the patient was back to her baseline and was discharged back to the facility. She was brought back in by EMS today on 8/7 with complaints of left hip pain for 2 days. No recurrent falls since 7/31.She was also noted to have bruising at her right distal forearm yesterday. She has a history of atrial fibrillation not on anticoagulation and was found to be in rapid A. fib with a heart rate of 132. Pt sundowns almost every night. Pt has reportedly lost more than 20 lbs with dementia. Pt had surgery for L hip fracture on 8/8. CXR 8/7 showed no acute disease. Bedside swallow eval ordered.     PT Comments    Pt admitted with above diagnosis. Pt currently with functional limitations due to balance and endurance deficits with lethargy affecting pts participation. Pt limited in abilities as she is too lethargic to participate.  Nurse and MD aware with pt having issues with not getting enough nutrition.  Will continue PT as able.   Pt will benefit from skilled PT to increase their independence and safety with mobility to allow discharge to the venue listed below.    Follow Up Recommendations  SNF;Supervision/Assistance - 24 hour     Equipment Recommendations  Other (comment) (TBA)    Recommendations for Other Services       Precautions / Restrictions Precautions Precautions: Fall;Posterior Hip Precaution Booklet Issued: Yes (comment) Required Braces or Orthoses: Knee Immobilizer - Left Restrictions Weight Bearing Restrictions: Yes LLE Weight Bearing: Weight bearing as  tolerated    Mobility  Bed Mobility Overal bed mobility: Needs Assistance;+2 for physical assistance Bed Mobility: Sit to Supine       Sit to supine: +2 for physical assistance;Total assist   General bed mobility comments: Had to assist with lowering trunk and lifting LEs.  Pt did not assist at all.    Transfers Overall transfer level: Needs assistance Equipment used: 2 person hand held assist Transfers: Sit to/from [employer] Sit to Stand: Max assist;+2 physical assistance   Squat pivot transfers: Max assist;+2 physical assistance     General transfer comment: Used gait belt and pad to assist with squat pivot transfer with pt weight bearing on bil LEs with more weight on right LE than on left LE.    Ambulation/Gait                 Stairs            Wheelchair Mobility    Modified Rankin (Stroke Patients Only)       Balance Overall balance assessment: Needs assistance;History of Falls Sitting-balance support: Feet supported;Bilateral upper extremity supported Sitting balance-Leahy Scale: Zero Sitting balance - Comments: Still needs total assist to sit EOB.  No attempts to use UEs to assist with balance.   Postural control: Posterior lean;Right lateral lean                          Cognition Arousal/Alertness: Lethargic (nurse unsure why pt so lethargic) Behavior During Therapy: WFL for tasks assessed/performed Overall Cognitive Status: History  of cognitive impairments - at baseline Area of Impairment: Orientation;Memory;Following commands;Safety/judgement;Awareness;Problem solving Orientation Level: Place;Time;Situation;Person   Memory: Decreased short-term memory;Decreased recall of precautions Following Commands: Follows one step commands inconsistently;Follows one step commands with increased time Safety/Judgement: Decreased awareness of safety;Decreased awareness of deficits Awareness:  (pre-intellectual) Problem  Solving: Slow processing;Decreased initiation;Difficulty sequencing;Requires verbal cues;Requires tactile cues      Exercises      General Comments        Pertinent Vitals/Pain Pain Assessment: Faces Pain Score: 5  Pain Location: left hip Pain Descriptors / Indicators: Grimacing;Guarding;Sore Pain Intervention(s): Limited activity within patient's tolerance;Monitored during session;Repositioned;Premedicated before session  98 -111 bpm, 86-88% O2 on RA on arrival.  Replaced O2 at 4LO2 and O2 at 100%.  Nurse aware.      Home Living                      Prior Function            PT Goals (current goals can now be found in the care plan section) Progress towards PT goals: Not progressing toward goals - comment (very sleepy)    Frequency  Min 2X/week    PT Plan Frequency needs to be updated    Co-evaluation             End of Session Equipment Utilized During Treatment: Gait belt;Oxygen Activity Tolerance: Patient limited by fatigue;Patient limited by lethargy;Patient limited by pain Patient left: with call bell/phone within reach;in bed;with bed alarm set;with family/visitor present;with SCD's reapplied     Time: 1205-1230 PT Time Calculation (min) (ACUTE ONLY): 25 min  Charges:  $Therapeutic Activity: 23-37 mins                    G Codes:      [name] F 10/25/14, 1:20 PM [employer] [facility] [phone] (pager)

## 2014-09-30 NOTE — Progress Notes (Signed)
Ellen [name] TEAM 1 - Stepdown/ICU TEAM Progress Note  Ellen [name] NWG:[id number] DOB: 11-18-1926 DOA: 09/25/2014 PCP: [name], MD  Admit HPI / Brief Narrative:  81 yo WF lives in [facility] assisted living facility PMHx Dementia iron deficiency anemia, HTN, HLD, CAD of native artery, CABG, A. fib with RVR, Venous insufficiency, COPD O2 dependent dementia stays in assisted living. Was in , over each of last years time she has progressively needed the next increase level of care. Daughter at bedside, POA. Daughter states she ambulates with walker on her own. Sundowns almost every night. Had a fall 7 days ago, found on floor. Was able to walk all week , no other falls. Last 24 hrs hip short and externally rotated with apparent pain attempting to ambulate. Has lost weight more than 20 lbs with dementia.   HPI/Subjective: 8/12 sleepy but arousable A/O 2 (does not know where, when), did know she was in the hospital, states she fell while getting dressed and fractured her hip. Negative CP, negative SOB, positive appropriate left hip pain   Assessment/Plan: Displaced left femoral neck fracture   Atrial fibrillation with rapid ventricular response -Per cardiology  Iron deficiency anemia -patient received aranesp at cancer center. Hemoglobin stable  CAD native artery - Negative cardiac enzymes  Cardiomyopathy -Per cardiology  Essential hypertension, -Per cardiology   Pulmonary hypertension -Per cardiology  GERD-continue PPI     Code Status: FULL Family Communication: no family present at time of exam Disposition Plan:     Consultants: Patrecia Pace (cardiology)   Procedure/Significant Events: 3/21 echocardiogram- Left ventricle: LVEF=55%-60%.  - Aortic valve: mild to moderate regurgitation. - Mitral valve: mild to moderate regurgitation. - Left atrium: severely dilated. - Tricuspid valve: mild-moderate regurgitation. -Pulmonary arteries: PA  peak pressure: 37 mm Hg (S).   Culture   Antibiotics:   DVT prophylaxis: Lovenox   Devices    LINES / TUBES:      Continuous Infusions: . sodium chloride 20 mL (09/28/14 0409)  . lactated ringers Stopped (09/27/14 1700)    Objective: VITAL SIGNS: Temp: 97.5 F (36.4 C) (08/12 1930) Temp Source: Oral (08/12 1930) BP: 133/99 mmHg (08/12 1930) Pulse Rate: 118 (08/12 1930) SPO2; FIO2:   Intake/Output Summary (Last 24 hours) at 09/30/14 2203 Last data filed at 09/30/14 1937  Gross per 24 hour  Intake    180 ml  Output    800 ml  Net   -620 ml     Exam: General: No acute respiratory distress Eyes: Negative headache, eye pain, double vision,negative scleral hemorrhage ENT: Negative Runny nose, negative ear pain, negative tinnitus, negative gingival bleeding, Neck:  Negative scars, masses, torticollis, lymphadenopathy, JVD** Lungs: Clear to auscultation bilaterally without wheezes or crackles Cardiovascular: Regular rate and rhythm without murmur gallop or rub normal S1 and S2 Abdomen:negative abdominal pain, negative dysphagia, nondistended, positive soft, bowel sounds, no rebound, no ascites, no appreciable mass Extremities: No significant cyanosis, clubbing, or edema bilateral lower extremities; appropriate left hip pain staples in place negative sign of infection Psychiatric:  Negative depression, negative anxiety, negative fatigue, negative mania  Neurologic:  Cranial nerves II through XII intact, tongue/uvula midline, all extremities muscle strength 5/5, sensation intact throughout,negative dysarthria, negative expressive aphasia, negative receptive aphasia.    Data Reviewed: Basic Metabolic Panel:  Recent Labs Lab 09/25/14 1050 09/25/14 1841 09/27/14 0426 09/28/14 0305 09/29/14 0355 09/30/14 0209  NA 132*  --  134* 137 137 137  K 4.1  --  4.9 4.7  5.0 4.5  CL 96*  --  97* 103 101 99*  CO2 26  --  32  GLUCOSE 148*  --  91 86 88 92  BUN  17  --  <5*  CREATININE 0.62 0.70 0.88 0.73 0.57 0.50  CALCIUM 9.0  --  8.4* 8.2* 8.3* 8.3*  MG  --  1.8  --   --   --   --    Liver Function Tests:  Recent Labs Lab 09/27/14 0426 09/28/14 0305  AST 152* 61*  ALT 151* 70*  ALKPHOS 198* 154*  BILITOT 1.1 1.0  PROT 5.8* 5.4*  ALBUMIN 2.6* 2.5*   No results for input(s): LIPASE, AMYLASE in the last 168 hours. No results for input(s): AMMONIA in the last 168 hours. CBC:  Recent Labs Lab 09/25/14 1050 09/25/14 1841 09/27/14 0426 09/28/14 0305 09/29/14 0355  WBC 12.2* 11.2* 9.8 6.9 7.0  NEUTROABS 10.5*  --   --   --   --   HGB 13.6 13.8 11.8* 11.2* 11.4*  HCT 41.6 41.6 37.4 36.0 36.4  MCV 90.6 90.4 93.3 94.5 96.3  PLT 288 290 300 231 205   Cardiac Enzymes:  Recent Labs Lab 09/25/14 1841 09/25/14 2336 09/26/14 0534  TROPONINI 0.03 <0.03 <0.03   BNP (last 3 results) No results for input(s): BNP in the last 8760 hours.  ProBNP (last 3 results) No results for input(s): PROBNP in the last 8760 hours.  CBG:  Recent Labs Lab 09/28/14 0727 09/29/14 0734  GLUCAP 74 90    Recent Results (from the past 240 hour(s))  MRSA PCR Screening     Status: None   Collection Time: 09/25/14  3:26 PM  Result Value Ref Range Status   MRSA by PCR NEGATIVE NEGATIVE Final    Comment:        The GeneXpert MRSA Assay (FDA approved for NASAL specimens only), is one component of a comprehensive MRSA colonization surveillance program. It is not intended to diagnose MRSA infection nor to guide or monitor treatment for MRSA infections.   Urine culture     Status: None   Collection Time: 09/26/14  8:17 AM  Result Value Ref Range Status   Specimen Description URINE, CATHETERIZED  Final   Special Requests NONE  Final   Culture 20,000 COLONIES/mL LACTOBACILLUS SPECIES  Final   Report Status 09/28/2014 FINAL  Final     Studies:  Recent x-ray studies have been reviewed in detail by the Attending Physician  Scheduled  Meds:  Scheduled Meds: . ALPRAZolam  0.25 mg Oral QHS  . antiseptic oral rinse  7 mL Mouth Rinse BID  . aspirin EC  81 mg Oral Q breakfast  . digoxin  0.25 mg Intravenous Once  . [START ON 10/01/2014] diltiazem  360 mg Oral Daily  . diltiazem  5 mg Intravenous Once  . docusate sodium  100 mg Oral BID  . enoxaparin (LOVENOX) injection  40 mg Subcutaneous Q24H  . feeding supplement (ENSURE ENLIVE)  237 mL Oral BID BM  . memantine  10 mg Oral Daily  . metoprolol tartrate  12.5 mg Oral BID  . pantoprazole  40 mg Oral Daily  . sertraline  50 mg Oral Daily  . sodium chloride  3 mL Intravenous Q12H    Time spent on care of this patient: 40 mins   Marcelis Wissner, Roselind Messier , MD  [facility] Office  [phone] Pager [phone]  On-Call/Text Page:  [email or web address]      password [email or web address]  If 7PM-7AM, please contact night-coverage [email or web address] Password [email or web address] 09/30/2014, 10:03 PM   LOS: 5 days   Care during the described time interval was provided by me .  I have reviewed this patient's available data, including medical history, events of note, physical examination, and all test results as part of my evaluation. I have personally reviewed and interpreted all radiology studies.   Carolyne Littles, MD [phone] Pager

## 2014-09-30 NOTE — Progress Notes (Signed)
Subjective: Pain controlled.     Objective: Vital signs in last 24 hours: Temp:  [97.1 F (36.2 C)-98.5 F (36.9 C)] 97.7 F (36.5 C) (08/12 1157) Pulse Rate:  [90-101] 99 (08/12 1157) Resp:  [10-26] 19 (08/12 1157) BP: (121-147)/(47-75) 143/65 mmHg (08/12 1157) SpO2:  [94 %-100 %] 94 % (08/12 1157)  Intake/Output from previous day: 08/11 0701 - 08/12 0700 In: 50 [P.O.:50] Out: 1000 [Urine:1000] Intake/Output this shift: Total I/O In: 60 [P.O.:60] Out: -    Recent Labs  09/28/14 0305 09/29/14 0355  HGB 11.2* 11.4*    Recent Labs  09/28/14 0305 09/29/14 0355  WBC 6.9 7.0  RBC 3.81* 3.78*  HCT 36.0 36.4  PLT 231 205    Recent Labs  09/29/14 0355 09/30/14 0209  NA 137 137  K 5.0 4.5  CL 101 99*  CO2 28 32  BUN 9 <5*  CREATININE 0.57 0.50  GLUCOSE 88 92  CALCIUM 8.3* 8.3*   No results for input(s): LABPT, INR in the last 72 hours.  Exam:  Hip wound looks good.  Staples intact.  No drainage or signs of infection.  Calf nontender,  Nvi.    Assessment/Plan: snf when medically stable.  Continue present care.    Jesua Tamblyn,[place] M 09/30/2014, 12:08 PM

## 2014-09-30 NOTE — Progress Notes (Signed)
CSW spoke with MD- pt is not stable for DC today- informed MD that insurance authorization is good through Saturday for DC to [facility].  CSW updated facility and sent updated clinicals  CSW will continue to follow.  Merlyn Lot, LCSWA Clinical Social Worker [phone]

## 2014-09-30 NOTE — Progress Notes (Signed)
Patient Name: Ellen [name] Date of Encounter: 09/30/2014  Primary Cardiologist Dr. Katrinka Blazing   Active Problems:   Iron deficiency anemia   Coronary atherosclerosis   GERD   Essential hypertension, benign   Closed left hip fracture   Hip fracture   Atrial fibrillation with RVR    SUBJECTIVE  Confused. This is her baseline per nursing staff. When asked about how she feels, she replied " I don't feel good", however could not express how she is not feeling good  CURRENT MEDS . ALPRAZolam  0.25 mg Oral QHS  . antiseptic oral rinse  7 mL Mouth Rinse BID  . aspirin EC  81 mg Oral Q breakfast  . digoxin  0.25 mg Intravenous Once  . diltiazem  240 mg Oral Daily  . diltiazem  5 mg Intravenous Once  . docusate sodium  100 mg Oral BID  . enoxaparin (LOVENOX) injection  40 mg Subcutaneous Q24H  . feeding supplement (ENSURE ENLIVE)  237 mL Oral BID BM  . memantine  10 mg Oral Daily  . pantoprazole  40 mg Oral Daily  . sertraline  50 mg Oral Daily  . sodium chloride  3 mL Intravenous Q12H    OBJECTIVE  Filed Vitals:   09/30/14 0007 09/30/14 0400 09/30/14 0742 09/30/14 1157  BP: 143/71 147/67 135/75 143/65  Pulse:   101 99  Temp: 98.5 F (36.9 C) 98.2 F (36.8 C) 98 F (36.7 C) 97.7 F (36.5 C)  TempSrc: Axillary Axillary Axillary Axillary  Resp:  10 18 19   Height:      Weight:      SpO2:  97% 99% 94%    Intake/Output Summary (Last 24 hours) at 09/30/14 1502 Last data filed at 09/30/14 1300  Gross per 24 hour  Intake    170 ml  Output   1000 ml  Net   -830 ml   Filed Weights   09/25/14 0952 09/25/14 1921  Weight: 150 lb (68.04 kg) 129 lb 13.6 oz (58.9 kg)    PHYSICAL EXAM  General: confused, moaning, states not feeling good. Does open eyes to command, can not make out what she want to say HEENT:  Normal  Neck: Supple without bruits or JVD. Lungs:  Resp regular and unlabored. Anterior exam occasional rhonchi Heart: irregular. no s3, s4, or murmurs. Abdomen:  Soft, non-tender, non-distended, BS + x 4.  Extremities: No clubbing, cyanosis or edema. Pain is L hip  Accessory Clinical Findings  CBC  Recent Labs  09/28/14 0305 09/29/14 0355  WBC 6.9 7.0  HGB 11.2* 11.4*  HCT 36.0 36.4  MCV 94.5 96.3  PLT 231 205   Basic Metabolic Panel  Recent Labs  09/29/14 0355 09/30/14 0209  NA 137 137  K 5.0 4.5  CL 101 99*  CO2 28 32  GLUCOSE 88 92  BUN 9 <5*  CREATININE 0.57 0.50  CALCIUM 8.3* 8.3*   Liver Function Tests  Recent Labs  09/28/14 0305  AST 61*  ALT 70*  ALKPHOS 154*  BILITOT 1.0  PROT 5.4*  ALBUMIN 2.5*    TELE A-fib with HR 90-110s    ECG  No new EKG  Echocardiogram 05/09/2014  LV EF: 55% -  60%  ------------------------------------------------------------------- Indications:   I48.91 Atrial Fibrillation.  ------------------------------------------------------------------- History:  PMH: Acquired from the patient and from the patient&'s chart. PMH: Venous Insufficiency. CAD. Dementia. COPD. Dyspnea. Risk factors: Hypertension.  ------------------------------------------------------------------- Study Conclusions  - Left ventricle: The cavity size was normal. Systolic  function was normal. The estimated ejection fraction was in the range of 55% to 60%. Wall motion was normal; there were no regional wall motion abnormalities. - Aortic valve: Trileaflet; normal thickness, mildly calcified leaflets. There was mild to moderate regurgitation. - Aorta: There is moderate focal calcification of the Aorta at the sinuses of Valsalva. No history in the chart of aortic root surgery or AV surgery in the past. - Mitral valve: There was mild to moderate regurgitation. - Left atrium: The atrium was severely dilated. - Tricuspid valve: There was mild-moderate regurgitation. - Pulmonic valve: There was trivial regurgitation. - Pulmonary arteries: PA peak pressure: 37 mm Hg  (S).  Impressions:  - The right ventricular systolic pressure was increased consistent with mild pulmonary hypertension.    Radiology/Studies  Dg Chest 1 View  09/25/2014   CLINICAL DATA:  Pt states she fell at nursing home 2 days ago. States chronic sob. Denies chest pain. Hx of cabg.  EXAM: CHEST  1 VIEW  COMPARISON:  09/06/2013  FINDINGS: Changes from CABG surgery are stable. Cardiac silhouette is mildly enlarged. No mediastinal or hilar masses or convincing adenopathy.  Lungs show areas of reticular nodular opacity reflecting dilated partly mucous filled bronchi. These findings are stable. There is elevation of left hemidiaphragm, also stable.  No acute lung consolidation and no evidence of pulmonary edema. No pleural effusion or pneumothorax.  Bony thorax is demineralized but grossly intact.  IMPRESSION: 1. No acute cardiopulmonary disease.   Electronically Signed   By: Amie Portland M.D.   On: 09/25/2014 10:32   Dg Thoracic Spine 2 View  09/18/2014   CLINICAL DATA:  Acute upper thoracic pain following fall today. Initial encounter.  EXAM: THORACIC SPINE 2 VIEWS  COMPARISON:  07/04/2014 and prior chest radiographs  FINDINGS: Normal alignment is noted.  There is no evidence of acute fracture or subluxation.  Mild multilevel degenerative disc disease identified.  No focal bony lesions are identified.  CABG changes are again noted.  IMPRESSION: No evidence of acute bony abnormality.   Electronically Signed   By: Harmon Pier M.D.   On: 09/18/2014 08:17   Dg Shoulder Right  09/18/2014   CLINICAL DATA:  Unwitnessed fall.  Shoulder pain  EXAM: RIGHT SHOULDER - 2+ VIEW  COMPARISON:  None.  FINDINGS: Glenohumeral joint is intact. No evidence of scapular fracture or humeral fracture. The acromioclavicular joint is intact.  IMPRESSION: No fracture or dislocation.   Electronically Signed   By: Genevive Bi M.D.   On: 09/18/2014 08:16   Ct Head Wo Contrast  09/18/2014   CLINICAL DATA:  Unwitnessed  fall, now with posterior neck pain.  EXAM: CT HEAD WITHOUT CONTRAST  CT CERVICAL SPINE WITHOUT CONTRAST  TECHNIQUE: Multidetector CT imaging of the head and cervical spine was performed following the standard protocol without intravenous contrast. Multiplanar CT image reconstructions of the cervical spine were also generated.  COMPARISON:  Head CT - 05/18/2012  FINDINGS: CT HEAD FINDINGS  Examination is minimally degraded secondary to patient motion, necessitating the acquisition of additional images.  Similar findings of advanced atrophy with sulcal prominence and centralized volume loss with commensurate ex vacuo dilatation the ventricular system. Scattered periventricular hypodensities compatible with microvascular ischemic disease. Unchanged punctate calcification in the right basal ganglia. Given extensive background parenchymal abnormalities, there is no CT evidence of superimposed acute large territory infarct. No intraparenchymal or extra-axial mass or hemorrhage. Unchanged size and configuration of the ventricles and basilar cisterns. No midline shift. Intracranial atherosclerosis.  Regional soft tissues appear normal. Post bilateral cataract surgery. Limited visualization the paranasal sinuses and mastoid air cells is normal. No air-fluid levels. No displaced calvarial fracture.  CT CERVICAL SPINE FINDINGS  C1 to the superior endplate of T3 is imaged.  There is minimal (approximately 1-2 mm) of anterolisthesis of C3 upon C4 and C7 upon T1. The dens is normally positioned between the lateral masses of C1. Normal atlantodental and atlantoaxial articulations. The bilateral facets are normally aligned.  No fracture or static subluxation of cervical spine. Cervical vertebral body heights are preserved. Prevertebral soft tissues are normal.  Mild to moderate multilevel cervical spine DDD, worse at C5-C6 and C6-C7 with disc space height loss, endplate irregularity and small posteriorly directed disc osteophyte  complexes at these locations. There is partial ossification the nuchal ligament posterior to the C3 spinous process.  Atherosclerotic plaque within the bilateral carotid bulbs. There is mild diffuse heterogeneity of the thyroid parenchyma without discrete nodule on this noncontrast examination. No bulky cervical lymphadenopathy on this noncontrast examination.  Limited visualization of lung apices demonstrates ill-defined slightly nodular opacities within the imaged portions of the bilateral lung apices (representative images 75 and 83, series 7).  IMPRESSION: 1. Similar findings of advanced atrophy and microvascular ischemic disease without acute intracranial process. 2. No fracture or static subluxation of the cervical spine. 3. Mild to moderate multilevel cervical spine DDD, worse at C5-C6 and C6-C7.   Electronically Signed   By: Simonne Come M.D.   On: 09/18/2014 08:13   Ct Cervical Spine Wo Contrast  09/18/2014   CLINICAL DATA:  Unwitnessed fall, now with posterior neck pain.  EXAM: CT HEAD WITHOUT CONTRAST  CT CERVICAL SPINE WITHOUT CONTRAST  TECHNIQUE: Multidetector CT imaging of the head and cervical spine was performed following the standard protocol without intravenous contrast. Multiplanar CT image reconstructions of the cervical spine were also generated.  COMPARISON:  Head CT - 05/18/2012  FINDINGS: CT HEAD FINDINGS  Examination is minimally degraded secondary to patient motion, necessitating the acquisition of additional images.  Similar findings of advanced atrophy with sulcal prominence and centralized volume loss with commensurate ex vacuo dilatation the ventricular system. Scattered periventricular hypodensities compatible with microvascular ischemic disease. Unchanged punctate calcification in the right basal ganglia. Given extensive background parenchymal abnormalities, there is no CT evidence of superimposed acute large territory infarct. No intraparenchymal or extra-axial mass or hemorrhage.  Unchanged size and configuration of the ventricles and basilar cisterns. No midline shift. Intracranial atherosclerosis.  Regional soft tissues appear normal. Post bilateral cataract surgery. Limited visualization the paranasal sinuses and mastoid air cells is normal. No air-fluid levels. No displaced calvarial fracture.  CT CERVICAL SPINE FINDINGS  C1 to the superior endplate of T3 is imaged.  There is minimal (approximately 1-2 mm) of anterolisthesis of C3 upon C4 and C7 upon T1. The dens is normally positioned between the lateral masses of C1. Normal atlantodental and atlantoaxial articulations. The bilateral facets are normally aligned.  No fracture or static subluxation of cervical spine. Cervical vertebral body heights are preserved. Prevertebral soft tissues are normal.  Mild to moderate multilevel cervical spine DDD, worse at C5-C6 and C6-C7 with disc space height loss, endplate irregularity and small posteriorly directed disc osteophyte complexes at these locations. There is partial ossification the nuchal ligament posterior to the C3 spinous process.  Atherosclerotic plaque within the bilateral carotid bulbs. There is mild diffuse heterogeneity of the thyroid parenchyma without discrete nodule on this noncontrast examination. No bulky cervical lymphadenopathy on  this noncontrast examination.  Limited visualization of lung apices demonstrates ill-defined slightly nodular opacities within the imaged portions of the bilateral lung apices (representative images 75 and 83, series 7).  IMPRESSION: 1. Similar findings of advanced atrophy and microvascular ischemic disease without acute intracranial process. 2. No fracture or static subluxation of the cervical spine. 3. Mild to moderate multilevel cervical spine DDD, worse at C5-C6 and C6-C7.   Electronically Signed   By: Simonne Come M.D.   On: 09/18/2014 08:13   Dg Chest Port 1 View  09/28/2014   CLINICAL DATA:  Initial encounter for worsening shortness of  breath today.  EXAM: PORTABLE CHEST - 1 VIEW  COMPARISON:  09/25/2014.  FINDINGS: 0835 hours. Low volume film. No overt airspace pulmonary edema or focal lung consolidation. There is atelectasis at the left lung base. Interstitial markings are diffusely coarsened with chronic features. The cardio pericardial silhouette is enlarged. Patient is status post CABG. Telemetry leads overlie the chest. Bones are diffusely demineralized.  IMPRESSION: Low volume film without acute cardiopulmonary findings.   Electronically Signed   By: Kennith Center M.D.   On: 09/28/2014 08:43   Dg Hip Port Unilat With Pelvis 1v Left  09/26/2014   CLINICAL DATA:  81 year old female status post left hip replacement  EXAM: DG HIP (WITH OR WITHOUT PELVIS) 1V PORT LEFT  COMPARISON:  Preoperative radiographs 09/25/2014  FINDINGS: Interval left hip hemi arthroplasty. No evidence of acute abnormality. Expected postoperative subcutaneous emphysema. The visualized bony pelvis and right femur appear intact.  IMPRESSION: Surgical changes of left hip hemi arthroplasty without evidence of immediate complication.   Electronically Signed   By: Malachy Moan M.D.   On: 09/26/2014 18:37   Dg Hip Unilat With Pelvis 2-3 Views Left  09/25/2014   CLINICAL DATA:  Larey Seat 2 days ago at the nursing home. Left hip pain since.  EXAM: DG HIP (WITH OR WITHOUT PELVIS) 2-3V LEFT  COMPARISON:  None.  FINDINGS: There is a displaced, non comminuted fracture of the left femoral neck. This is in mid cervical femoral neck fracture. Shaft component has retracted superiorly displacing 15 mm. There is also apex anterior angulation of approximately 30 degrees as well as significant varus angulation.  There are no other fractures. Bones are diffusely demineralized. There is superior lateral hip joint space narrowing on the left.  Soft tissues demonstrate vascular calcifications but are otherwise unremarkable.  IMPRESSION: 1. Displaced, non comminuted fracture of the left  femoral neck with varus and apex anterior angulation.   Electronically Signed   By: Amie Portland M.D.   On: 09/25/2014 10:29   US Abdomen Limited Ruq  09/28/2014   CLINICAL DATA:  Right upper quadrant abdominal pain.  EXAM: US ABDOMEN LIMITED - RIGHT UPPER QUADRANT  COMPARISON:  CT scan of March 09, 2009.  FINDINGS: Gallbladder:  Status post cholecystectomy.  Common bile duct:  Diameter: 5.3 mm which is within normal limits.  Liver:  Heterogeneous echotexture of hepatic parenchyma is noted. 1.6 cm cyst is seen in left hepatic lobe. 8 mm cyst is seen anteriorly in right hepatic lobe. 1.8 cm echogenic focus is seen in left hepatic lobe most consistent with hemangioma in the absence of any history of primary malignancy.  IMPRESSION: Status post cholecystectomy. No evidence of intrahepatic or extrahepatic biliary dilatation.  Multiple small hepatic cysts are noted. Heterogeneous echotexture of hepatic parenchyma is noted suggesting diffuse hepatocellular disease such as hepatitis.  1.8 mm echogenic focus seen anteriorly in left hepatic lobe ;  in the absence of any history of malignancy, this most likely represents benign hemangioma, and followup ultrasound in 6 months would be recommended to ensure stability. If the patient has a history of malignancy, then MRI would be recommended.   Electronically Signed   By: Lupita Raider, M.D.   On: 09/28/2014 11:02    ASSESSMENT AND PLAN  81 yo with chronic a-fib, CAD, COPD presented after a fall on 7/31, she was discharged, however came back on 8/7 with 2 days of L hip pain. Found to have hip Fx and a-fib with RVR. S/p L hemiarthroplasty by orthopedic on 09/26/2014.   1. Chronic atrial fibrillation with RVR  - CHADSVACs score of at least 5 (age, sex, HTN, vascular disease). Not on anticoagulation given age, dementia and high fall risk. Will aim for rate control.  - Echo 05/09/14 showed LV EF of 55-60%, no WM abnormality, mild to moderate aortic and mitral  regurgitation, PA peak pressure: 37 mm Hg (S).    - Currently on  daily diltiazem, SBP 130-140s, HR 90-110s, will add additional  PO diltiazem today and transition to  diltiazem tomorrow. Given single dose IV digoxin yesterday. If HR still not controlled tomorrow, may consider addition of low dose digoxin daily. May readdress systemic anticoagulation need later once recover from surgery.  - most concerning aspect is her mental status, unfortunately, it is unclear what's her baseline mental status. She did complain of not feeling good during exam, however couldn't say what, O2 sat 100 on 3L, anterior exam occasional rhonchi, ?if need to obtain repeat CXR in AM to make sure she's not in HF (I/O +2.5 L)  2. Hip fracture after recent fall  3. CAD s/p remote CABG  4. HTN  5. COPD with chronic bronchitis  6. Carotid bruit  7. Advanced dementia  Signed, Amedeo Plenty Pager: [phone]   Patient seen and examined. Agree with assessment and plan. AF rate in 90 - 100 range. No chest pain. Hurts all over. Will add low dose BB to regimen to additional rate control. EF normal    Lennette Bihari, MD, [facility] 09/30/2014 4:57 PM

## 2014-09-30 NOTE — Progress Notes (Signed)
Speech Language Pathology Treatment: Dysphagia  Patient Details Name: Ellen [name] MRN: [id number] DOB: 20-May-1926 Today's Date: 09/30/2014 Time: 1478-2956 SLP Time Calculation (min) (ACUTE ONLY): 9 min  Assessment / Plan / Recommendation Clinical Impression  Pt requires Max cues for alertness and sustained attention to PO trials, with little intake despite encouragement. Oral holding is brief with thin liquids, but significantly prolonged with pureed solids. Pt will initiate bolus manipulation, but will begin to fall asleep before bolus is cleared from her oral cavity, requiring prompting from therapist for continued transit. Pt's lethargy and mentation are her biggest limiting factors from diet advancement at this time.   HPI Other Pertinent Information: Pt is an 81 y.o. female with PMH oxygen dependent COPD, allergic rhinitis, coronary artery disease, iron deficiency anemia, gastroesophageal reflux disease, seen in the ER on 7/31, after a fall at a memory care facility. She did not lose consciousness. According to the staff and EMS the patient was back to her baseline and was discharged back to the facility. She was brought back in by EMS today on 8/7 with complaints of left hip pain for 2 days. No recurrent falls since 7/31.She was also noted to have bruising at her right distal forearm yesterday. She has a history of atrial fibrillation not on anticoagulation and was found to be in rapid A. fib with a heart rate of 132. Pt sundowns almost every night. Pt has reportedly lost more than 20 lbs with dementia. Pt had surgery for L hip fracture on 8/8. CXR 8/7 showed no acute disease. Bedside swallow eval ordered.   Pertinent Vitals Pain Assessment: Faces Pain Score: 5  Faces Pain Scale: Hurts little more Pain Location: left hip with repositioning Pain Descriptors / Indicators: Grimacing Pain Intervention(s): Limited activity within patient's tolerance;Monitored during session  SLP Plan  Continue  with current plan of care    Recommendations Diet recommendations: Thin liquid Liquids provided via: Cup;Straw Medication Administration: Crushed with puree Supervision: Staff to assist with self feeding;Full supervision/cueing for compensatory strategies Compensations: Slow rate;Small sips/bites Postural Changes and/or Swallow Maneuvers: Seated upright 90 degrees       Oral Care Recommendations: Oral care BID Follow up Recommendations: Skilled Nursing facility Plan: Continue with current plan of care    Maxcine Ham, M.A. CCC-SLP [phone]  Maxcine Ham 09/30/2014, 3:18 PM

## 2014-10-01 DIAGNOSIS — M6248 Contracture of muscle, other site: Secondary | ICD-10-CM

## 2014-10-01 DIAGNOSIS — S72002B Fracture of unspecified part of neck of left femur, initial encounter for open fracture type I or II: Secondary | ICD-10-CM

## 2014-10-01 DIAGNOSIS — M62838 Other muscle spasm: Secondary | ICD-10-CM | POA: Diagnosis present

## 2014-10-01 LAB — BASIC METABOLIC PANEL
ANION GAP: 6 (ref 5–15)
BUN: 6 mg/dL (ref 6–20)
CO2: 36 mmol/L — AB (ref 22–32)
CREATININE: 0.5 mg/dL (ref 0.44–1.00)
Calcium: 8.4 mg/dL — ABNORMAL LOW (ref 8.9–10.3)
Chloride: 93 mmol/L — ABNORMAL LOW (ref 101–111)
GFR calc Af Amer: >60 mL/min (ref >60)
GFR calc non Af Amer: >60 mL/min (ref >60)
GLUCOSE: 101 mg/dL — AB (ref 65–99)
POTASSIUM: 4.2 mmol/L (ref 3.5–5.1)
Sodium: 135 mmol/L (ref 135–145)

## 2014-10-01 LAB — MAGNESIUM: Magnesium: 1.6 mg/dL — ABNORMAL LOW (ref 1.7–2.4)

## 2014-10-01 NOTE — Progress Notes (Signed)
TEAM 1 - Stepdown/ICU TEAM Progress Note  Aarionna Germer ZOX:[id number] DOB: 1926-09-25 DOA: 09/25/2014 PCP: [name], MD  Admit HPI / Brief Narrative:  81 yo WF lives in [place] assisted living facility PMHx Dementia iron deficiency anemia, HTN, HLD, CAD of native artery, CABG, A. fib with RVR, Venous insufficiency, COPD O2 dependent dementia stays in assisted living. Was in , over each of last years time she has progressively needed the next increase level of care. Daughter at bedside, POA. Daughter states she ambulates with walker on her own. Sundowns almost every night. Had a fall 7 days ago, found on floor. Was able to walk all week , no other falls. Last 24 hrs hip short and externally rotated with apparent pain attempting to ambulate. Has lost weight more than 20 lbs with dementia.   HPI/Subjective: 8/13 sleepy but arousable A/O 2 (does not know where, when), complains of her neck and shoulder hurting. States believes she hurt it in the fall. States she fell while getting dressed and fractured her hip. Negative CP, negative SOB, positive appropriate left hip pain   Assessment/Plan: Displaced left femoral neck fracture -Per orthopedic surgery -Ice to left hip home q shift -Out of bed to chair -Patient will need SNF  Neck pain/right trapezius muscle pain -Degenerative joint disease at multiple levels out of bed to chair, frequent repositioning. -K pad to neck and shoulder on the right  Atrial fibrillation with rapid ventricular response -Digoxin 0.25 mg 1 -Cardizem 360 mg daily  Iron deficiency anemia -patient received aranesp at [facility]. Hemoglobin stable  CAD native artery - Negative cardiac enzymes  Cardiomyopathy -See A. fib with RVR  Essential hypertension, -Controlled on current medication see A. fib RVR    Pulmonary hypertension -In future may want to add afterload reducing agent i.e. Imdur  GERD-continue PPI      Code Status: FULL Family Communication: no family present at time of exam Disposition Plan:     Consultants: Patrecia Pace (cardiology) Dr.Mark Becky Sax (orthopedic surgery)   Procedure/Significant Events: 3/21 echocardiogram- Left ventricle: LVEF=55%-60%.  - Aortic valve: mild to moderate regurgitation. - Mitral valve: mild to moderate regurgitation. - Left atrium: severely dilated. - Tricuspid valve: mild-moderate regurgitation. -Pulmonary arteries: PA peak pressure: 37 mm Hg (S). 7/31 CT C-spine without contrast;- minimal (approximately 1-2 mm) of anterolisthesis of C3 upon C4 and C7 upon T1. -No fracture or static subluxation of cervical spine.  -Mild to moderate multilevel cervical spine DDD, worse at C5-C6 and C6-C7 with disc space height loss,  8/9Left press-fit Depuy monopolar hemiarthroplasty, 43 ball, #2 stem +0 neck.  Culture   Antibiotics:   DVT prophylaxis: Lovenox   Devices    LINES / TUBES:      Continuous Infusions: . sodium chloride 20 mL (09/28/14 0409)  . lactated ringers Stopped (09/27/14 1700)    Objective: VITAL SIGNS: Temp: 97.8 F (36.6 C) (08/13 1600) Temp Source: Oral (08/13 1600) BP: 142/65 mmHg (08/13 1250) Pulse Rate: 75 (08/13 1600) SPO2; FIO2:   Intake/Output Summary (Last 24 hours) at 10/01/14 1925 Last data filed at 10/01/14 1800  Gross per 24 hour  Intake 980.25 ml  Output    600 ml  Net 380.25 ml     Exam: General: sleepy but arousable A/O 2 (does not know where, when), complains of her neck and shoulder hurting, No acute respiratory distress Eyes: Negative headache, eye pain, negative scleral hemorrhage ENT: Negative Runny nose, negative gingival bleeding, Neck:  Negative scars, masses, torticollis, lymphadenopathy, JVD, pain to palpation midline C7-T1, right C-spine paraspinal muscle spasms/upper trapezius muscle spasms. Lungs: Clear to auscultation bilaterally without wheezes or  crackles Cardiovascular: Regular rate and rhythm without murmur gallop or rub normal S1 and S2 Abdomen:negative abdominal pain, negative dysphagia, nondistended, positive soft, bowel sounds, no rebound, no ascites, no appreciable mass Extremities: No significant cyanosis, clubbing, or edema bilateral lower extremities; appropriate left hip pain staples in place negative sign of infection Psychiatric:  Negative depression, negative anxiety, negative fatigue, negative mania  Neurologic:  Cranial nerves II through XII intact, tongue/uvula midline, all extremities muscle strength 5/5, sensation intact throughout,negative dysarthria, negative expressive aphasia, negative receptive aphasia.    Data Reviewed: Basic Metabolic Panel:  Recent Labs Lab 09/25/14 1841 09/27/14 0426 09/28/14 0305 09/29/14 0355 09/30/14 0209 10/01/14 0256  NA  --  134* 137 137 137 135  K  --  4.9 4.7 5.0 4.5 4.2  CL  --  97* 103 101 99* 93*  CO2  --  28 28 28  32 36*  GLUCOSE  --  91 86 88 92 101*  BUN  --  19 18 9  <5* 6  CREATININE 0.70 0.88 0.73 0.57 0.50 0.50  CALCIUM  --  8.4* 8.2* 8.3* 8.3* 8.4*  MG 1.8  --   --   --   --  1.6*   Liver Function Tests:  Recent Labs Lab 09/27/14 0426 09/28/14 0305  AST 152* 61*  ALT 151* 70*  ALKPHOS 198* 154*  BILITOT 1.1 1.0  PROT 5.8* 5.4*  ALBUMIN 2.6* 2.5*   No results for input(s): LIPASE, AMYLASE in the last 168 hours. No results for input(s): AMMONIA in the last 168 hours. CBC:  Recent Labs Lab 09/25/14 1050 09/25/14 1841 09/27/14 0426 09/28/14 0305 09/29/14 0355  WBC 12.2* 11.2* 9.8 6.9 7.0  NEUTROABS 10.5*  --   --   --   --   HGB 13.6 13.8 11.8* 11.2* 11.4*  HCT 41.6 41.6 37.4 36.0 36.4  MCV 90.6 90.4 93.3 94.5 96.3  PLT 288 290 300 231 205   Cardiac Enzymes:  Recent Labs Lab 09/25/14 1841 09/25/14 2336 09/26/14 0534  TROPONINI 0.03 <0.03 <0.03   BNP (last 3 results) No results for input(s): BNP in the last 8760 hours.  ProBNP  (last 3 results) No results for input(s): PROBNP in the last 8760 hours.  CBG:  Recent Labs Lab 09/28/14 0727 09/29/14 0734  GLUCAP 74 90    Recent Results (from the past 240 hour(s))  MRSA PCR Screening     Status: None   Collection Time: 09/25/14  3:26 PM  Result Value Ref Range Status   MRSA by PCR NEGATIVE NEGATIVE Final    Comment:        The GeneXpert MRSA Assay (FDA approved for NASAL specimens only), is one component of a comprehensive MRSA colonization surveillance program. It is not intended to diagnose MRSA infection nor to guide or monitor treatment for MRSA infections.   Urine culture     Status: None   Collection Time: 09/26/14  8:17 AM  Result Value Ref Range Status   Specimen Description URINE, CATHETERIZED  Final   Special Requests NONE  Final   Culture 20,000 COLONIES/mL LACTOBACILLUS SPECIES  Final   Report Status 09/28/2014 FINAL  Final     Studies:  Recent x-ray studies have been reviewed in detail by the Attending Physician  Scheduled Meds:  Scheduled Meds: . ALPRAZolam  0.25 mg  Oral QHS  . antiseptic oral rinse  7 mL Mouth Rinse BID  . aspirin EC  81 mg Oral Q breakfast  . digoxin  0.25 mg Intravenous Once  . diltiazem  360 mg Oral Daily  . diltiazem  5 mg Intravenous Once  . docusate sodium  100 mg Oral BID  . enoxaparin (LOVENOX) injection  40 mg Subcutaneous Q24H  . feeding supplement (ENSURE ENLIVE)  237 mL Oral BID BM  . memantine  10 mg Oral Daily  . metoprolol tartrate  12.5 mg Oral BID  . pantoprazole  40 mg Oral Daily  . sertraline  50 mg Oral Daily  . sodium chloride  3 mL Intravenous Q12H    Time spent on care of this patient: 40 mins   Enzo Treu, Roselind Messier , MD  [facility] Office  [phone] Pager [phone]  On-Call/Text Page:      [email or web address]      password [email or web address]  If 7PM-7AM, please contact night-coverage [email or web address] Password [email or web address] 10/01/2014, 7:25 PM   LOS: 6 days   Care during the described  time interval was provided by me .  I have reviewed this patient's available data, including medical history, events of note, physical examination, and all test results as part of my evaluation. I have personally reviewed and interpreted all radiology studies.   Carolyne Littles, MD [phone] Pager

## 2014-10-02 DIAGNOSIS — I429 Cardiomyopathy, unspecified: Secondary | ICD-10-CM

## 2014-10-02 DIAGNOSIS — M542 Cervicalgia: Secondary | ICD-10-CM

## 2014-10-02 DIAGNOSIS — D509 Iron deficiency anemia, unspecified: Secondary | ICD-10-CM

## 2014-10-02 LAB — CBC WITH DIFFERENTIAL/PLATELET
Basophils Absolute: 0 10*3/uL (ref 0.0–0.1)
Basophils Relative: 0 % (ref 0–1)
Eosinophils Absolute: 0.3 10*3/uL (ref 0.0–0.7)
Eosinophils Relative: 4 % (ref 0–5)
HCT: 34.9 % — ABNORMAL LOW (ref 36.0–46.0)
Hemoglobin: 11.2 g/dL — ABNORMAL LOW (ref 12.0–15.0)
LYMPHS PCT: 17 % (ref 12–46)
Lymphs Abs: 1.3 10*3/uL (ref 0.7–4.0)
MCH: 29.9 pg (ref 26.0–34.0)
MCHC: 32.1 g/dL (ref 30.0–36.0)
MCV: 93.1 fL (ref 78.0–100.0)
Monocytes Absolute: 0.6 10*3/uL (ref 0.1–1.0)
Monocytes Relative: 8 % (ref 3–12)
NEUTROS PCT: 71 % (ref 43–77)
Neutro Abs: 5.1 10*3/uL (ref 1.7–7.7)
Platelets: 271 10*3/uL (ref 150–400)
RBC: 3.75 MIL/uL — AB (ref 3.87–5.11)
RDW: 13.1 % (ref 11.5–15.5)
WBC: 7.2 10*3/uL (ref 4.0–10.5)

## 2014-10-02 LAB — COMPREHENSIVE METABOLIC PANEL
ALT: 28 U/L (ref 14–54)
AST: 33 U/L (ref 15–41)
Albumin: 2.4 g/dL — ABNORMAL LOW (ref 3.5–5.0)
Alkaline Phosphatase: 127 U/L — ABNORMAL HIGH (ref 38–126)
Anion gap: 7 (ref 5–15)
BILIRUBIN TOTAL: 1.1 mg/dL (ref 0.3–1.2)
BUN: 8 mg/dL (ref 6–20)
CO2: 36 mmol/L — AB (ref 22–32)
Calcium: 8.4 mg/dL — ABNORMAL LOW (ref 8.9–10.3)
Chloride: 90 mmol/L — ABNORMAL LOW (ref 101–111)
Creatinine, Ser: 0.51 mg/dL (ref 0.44–1.00)
GFR calc Af Amer: >60 mL/min (ref >60)
GFR calc non Af Amer: >60 mL/min (ref >60)
GLUCOSE: 92 mg/dL (ref 65–99)
Potassium: 4 mmol/L (ref 3.5–5.1)
Sodium: 133 mmol/L — ABNORMAL LOW (ref 135–145)
TOTAL PROTEIN: 5.4 g/dL — AB (ref 6.5–8.1)

## 2014-10-02 LAB — MAGNESIUM: MAGNESIUM: 1.6 mg/dL — AB (ref 1.7–2.4)

## 2014-10-02 MED ORDER — MAGNESIUM SULFATE 2 GM/50ML IV SOLN
2.0000 g | Freq: Once | INTRAVENOUS | Status: AC
  Administered 2014-10-02: 2 g via INTRAVENOUS
  Filled 2014-10-02: qty 50

## 2014-10-02 NOTE — Progress Notes (Signed)
Attempted to give report to 5W @ 17:00 and 18:00. Will delay transfer of pt unmtil next shift per request.

## 2014-10-02 NOTE — Progress Notes (Signed)
[facility] TEAM 1 - Stepdown/ICU TEAM Progress Note  Ellen [name] ZOX:[id number] DOB: Oct 15, 1926 DOA: 09/25/2014 PCP: [name], MD  Admit HPI / Brief Narrative:  81 yo WF lives in [place] assisted living facility PMHx Dementia iron deficiency anemia, HTN, HLD, CAD of native artery, CABG, A. fib with RVR, Venous insufficiency, COPD O2 dependent dementia stays in assisted living. Was in , over each of last years time she has progressively needed the next increase level of care. Daughter at bedside, POA. Daughter states she ambulates with walker on her own. Sundowns almost every night. Had a fall 7 days ago, found on floor. Was able to walk all week , no other falls. Last 24 hrs hip short and externally rotated with apparent pain attempting to ambulate. Has lost weight more than 20 lbs with dementia.   HPI/Subjective: 8/14 sleepy but arousable A/O 2 (does not know where, when), sitting comfortably in chair. States her shoulder neck pain improved. Left hip pain improved. Negative CP, negative SOB, positive appropriate left hip pain   Assessment/Plan: Displaced left femoral neck fracture -Per orthopedic surgery -Ice to left hip q shift -Out of bed to chair q shift -PT recommends SNF; awaiting OT recommendation  Neck pain/right trapezius muscle pain -Degenerative joint disease at multiple levels out of bed to chair, frequent repositioning. -K pad to neck and shoulder on the right PRN  Atrial fibrillation with rapid ventricular response -Digoxin 0.25 mg 1 -Cardizem 360 mg daily -Metoprolol 12.5 mg BID  Hypomagnesemia -Magnesium goal> 2 -Magnesium IV magnesium 2 gm  Iron deficiency anemia -patient received aranesp at cancer center. Hemoglobin stable  CAD native artery - Negative cardiac enzymes  Cardiomyopathy -See A. fib with RVR  Essential hypertension, -Controlled on current medication see A. fib RVR    Pulmonary hypertension -In future may want  to add afterload reducing agent i.e. Imdur  GERD-continue PPI     Code Status: FULL Family Communication: no family present at time of exam Disposition Plan: Per orthopedic surgery; SNF    Consultants: Patrecia Pace (cardiology) Dr.Mark Becky Sax (orthopedic surgery)   Procedure/Significant Events: 3/21 echocardiogram- Left ventricle: LVEF=55%-60%.  - Aortic valve: mild to moderate regurgitation. - Mitral valve: mild to moderate regurgitation. - Left atrium: severely dilated. - Tricuspid valve: mild-moderate regurgitation. -Pulmonary arteries: PA peak pressure: 37 mm Hg (S). 7/31 CT C-spine without contrast;- minimal (approximately 1-2 mm) of anterolisthesis of C3 upon C4 and C7 upon T1. -No fracture or static subluxation of cervical spine.  -Mild to moderate multilevel cervical spine DDD, worse at C5-C6 and C6-C7 with disc space height loss,  8/9Left press-fit Depuy monopolar hemiarthroplasty, 43 ball, #2 stem +0 neck.  Culture   Antibiotics:   DVT prophylaxis: Lovenox   Devices    LINES / TUBES:      Continuous Infusions: . sodium chloride 20 mL (09/28/14 0409)  . lactated ringers Stopped (09/27/14 1700)    Objective: VITAL SIGNS: Temp: 97.5 F (36.4 C) (08/14 1200) Temp Source: Oral (08/14 1200) BP: 137/63 mmHg (08/14 0800) Pulse Rate: 75 (08/14 1200) SPO2; FIO2:   Intake/Output Summary (Last 24 hours) at 10/02/14 1340 Last data filed at 10/02/14 1100  Gross per 24 hour  Intake    170 ml  Output    300 ml  Net   -130 ml     Exam: General: sleepy but arousable A/O 2 (does not know where, when), right neck and shoulder pain improved , No acute respiratory distress Eyes:  Negative headache, eye pain, negative scleral hemorrhage ENT: Negative Runny nose, negative gingival bleeding, Neck:  Negative scars, masses, torticollis, lymphadenopathy, JVD, mild pain to palpation midline C7-T1, right C-spine paraspinal muscle spasms/upper trapezius  muscle spasms, much less spasm when compared to yesterday's exam.. Lungs: Clear to auscultation bilaterally without wheezes or crackles Cardiovascular: Regular rate and rhythm without murmur gallop or rub normal S1 and S2 Abdomen:negative abdominal pain, negative dysphagia, nondistended, positive soft, bowel sounds, no rebound, no ascites, no appreciable mass Extremities: No significant cyanosis, clubbing, or edema bilateral lower extremities; appropriate left hip pain staples in place negative sign of infection Psychiatric:  Negative depression, negative anxiety, negative fatigue, negative mania  Neurologic:  Cranial nerves II through XII intact, tongue/uvula midline, all extremities muscle strength 5/5, sensation intact throughout,negative dysarthria, negative expressive aphasia, negative receptive aphasia.    Data Reviewed: Basic Metabolic Panel:  Recent Labs Lab 09/25/14 1841  09/28/14 0305 09/29/14 0355 09/30/14 0209 10/01/14 0256 10/02/14 0615  NA  --   < > 137 137 137 135 133*  K  --   < > 4.7 5.0 4.5 4.2 4.0  CL  --   < > 103 101 99* 93* 90*  CO2  --   < > 28 28 32 36* 36*  GLUCOSE  --   < > 86 88 92 101* 92  BUN  --   < > 18 9 <5* 6 8  CREATININE 0.70  < > 0.73 0.57 0.50 0.50 0.51  CALCIUM  --   < > 8.2* 8.3* 8.3* 8.4* 8.4*  MG 1.8  --   --   --   --  1.6* 1.6*  < > = values in this interval not displayed. Liver Function Tests:  Recent Labs Lab 09/27/14 0426 09/28/14 0305 10/02/14 0615  AST 152* 61* 33  ALT 151* 70* 28  ALKPHOS 198* 154* 127*  BILITOT 1.1 1.0 1.1  PROT 5.8* 5.4* 5.4*  ALBUMIN 2.6* 2.5* 2.4*   No results for input(s): LIPASE, AMYLASE in the last 168 hours. No results for input(s): AMMONIA in the last 168 hours. CBC:  Recent Labs Lab 09/25/14 1841 09/27/14 0426 09/28/14 0305 09/29/14 0355 10/02/14 0615  WBC 11.2* 9.8 6.9 7.0 7.2  NEUTROABS  --   --   --   --  5.1  HGB 13.8 11.8* 11.2* 11.4* 11.2*  HCT 41.6 37.4 36.0 36.4 34.9*  MCV  90.4 93.3 94.5 96.3 93.1  PLT 290 300 231 205 271   Cardiac Enzymes:  Recent Labs Lab 09/25/14 1841 09/25/14 2336 09/26/14 0534  TROPONINI 0.03 <0.03 <0.03   BNP (last 3 results) No results for input(s): BNP in the last 8760 hours.  ProBNP (last 3 results) No results for input(s): PROBNP in the last 8760 hours.  CBG:  Recent Labs Lab 09/28/14 0727 09/29/14 0734  GLUCAP 74 90    Recent Results (from the past 240 hour(s))  MRSA PCR Screening     Status: None   Collection Time: 09/25/14  3:26 PM  Result Value Ref Range Status   MRSA by PCR NEGATIVE NEGATIVE Final    Comment:        The GeneXpert MRSA Assay (FDA approved for NASAL specimens only), is one component of a comprehensive MRSA colonization surveillance program. It is not intended to diagnose MRSA infection nor to guide or monitor treatment for MRSA infections.   Urine culture     Status: None   Collection Time: 09/26/14  8:17 AM  Result Value Ref Range Status   Specimen Description URINE, CATHETERIZED  Final   Special Requests NONE  Final   Culture 20,000 COLONIES/mL LACTOBACILLUS SPECIES  Final   Report Status 09/28/2014 FINAL  Final     Studies:  Recent x-ray studies have been reviewed in detail by the Attending Physician  Scheduled Meds:  Scheduled Meds: . ALPRAZolam  0.25 mg Oral QHS  . antiseptic oral rinse  7 mL Mouth Rinse BID  . aspirin EC  81 mg Oral Q breakfast  . digoxin  0.25 mg Intravenous Once  . diltiazem  360 mg Oral Daily  . diltiazem  5 mg Intravenous Once  . docusate sodium  100 mg Oral BID  . enoxaparin (LOVENOX) injection  40 mg Subcutaneous Q24H  . feeding supplement (ENSURE ENLIVE)  237 mL Oral BID BM  . magnesium sulfate 1 - 4 g bolus IVPB  2 g Intravenous Once  . memantine  10 mg Oral Daily  . metoprolol tartrate  12.5 mg Oral BID  . pantoprazole  40 mg Oral Daily  . sertraline  50 mg Oral Daily  . sodium chloride  3 mL Intravenous Q12H    Time spent on care  of this patient: 40 mins   WOODS, Roselind Messier , MD  [facility] Office  [phone] Pager [phone]  On-Call/Text Page:      [email or web address]      password [email or web address]  If 7PM-7AM, please contact night-coverage [email or web address] Password [email or web address] 10/02/2014, 1:40 PM   LOS: 7 days   Care during the described time interval was provided by me .  I have reviewed this patient's available data, including medical history, events of note, physical examination, and all test results as part of my evaluation. I have personally reviewed and interpreted all radiology studies.   Carolyne Littles, MD [phone] Pager

## 2014-10-03 DIAGNOSIS — G309 Alzheimer's disease, unspecified: Secondary | ICD-10-CM | POA: Diagnosis not present

## 2014-10-03 DIAGNOSIS — S72002D Fracture of unspecified part of neck of left femur, subsequent encounter for closed fracture with routine healing: Secondary | ICD-10-CM | POA: Diagnosis not present

## 2014-10-03 DIAGNOSIS — R261 Paralytic gait: Secondary | ICD-10-CM | POA: Diagnosis not present

## 2014-10-03 DIAGNOSIS — G9341 Metabolic encephalopathy: Secondary | ICD-10-CM | POA: Diagnosis not present

## 2014-10-03 DIAGNOSIS — R1312 Dysphagia, oropharyngeal phase: Secondary | ICD-10-CM | POA: Diagnosis not present

## 2014-10-03 DIAGNOSIS — M6281 Muscle weakness (generalized): Secondary | ICD-10-CM | POA: Diagnosis not present

## 2014-10-03 DIAGNOSIS — R1310 Dysphagia, unspecified: Secondary | ICD-10-CM | POA: Diagnosis not present

## 2014-10-03 DIAGNOSIS — I482 Chronic atrial fibrillation: Secondary | ICD-10-CM | POA: Diagnosis not present

## 2014-10-03 DIAGNOSIS — R1313 Dysphagia, pharyngeal phase: Secondary | ICD-10-CM | POA: Diagnosis not present

## 2014-10-03 DIAGNOSIS — I10 Essential (primary) hypertension: Secondary | ICD-10-CM | POA: Diagnosis not present

## 2014-10-03 DIAGNOSIS — Z23 Encounter for immunization: Secondary | ICD-10-CM | POA: Diagnosis not present

## 2014-10-03 DIAGNOSIS — R293 Abnormal posture: Secondary | ICD-10-CM | POA: Diagnosis not present

## 2014-10-03 LAB — CBC WITH DIFFERENTIAL/PLATELET
BASOS ABS: 0 10*3/uL (ref 0.0–0.1)
Basophils Relative: 0 % (ref 0–1)
Eosinophils Absolute: 0.2 10*3/uL (ref 0.0–0.7)
Eosinophils Relative: 2 % (ref 0–5)
HEMATOCRIT: 35.5 % — AB (ref 36.0–46.0)
HEMOGLOBIN: 11.6 g/dL — AB (ref 12.0–15.0)
LYMPHS PCT: 15 % (ref 12–46)
Lymphs Abs: 1 10*3/uL (ref 0.7–4.0)
MCH: 30 pg (ref 26.0–34.0)
MCHC: 32.7 g/dL (ref 30.0–36.0)
MCV: 91.7 fL (ref 78.0–100.0)
MONO ABS: 0.6 10*3/uL (ref 0.1–1.0)
MONOS PCT: 8 % (ref 3–12)
NEUTROS ABS: 4.9 10*3/uL (ref 1.7–7.7)
NEUTROS PCT: 75 % (ref 43–77)
Platelets: 308 10*3/uL (ref 150–400)
RBC: 3.87 MIL/uL (ref 3.87–5.11)
RDW: 13.1 % (ref 11.5–15.5)
WBC: 6.6 10*3/uL (ref 4.0–10.5)

## 2014-10-03 LAB — COMPREHENSIVE METABOLIC PANEL
ALT: 25 U/L (ref 14–54)
AST: 28 U/L (ref 15–41)
Albumin: 2.6 g/dL — ABNORMAL LOW (ref 3.5–5.0)
Alkaline Phosphatase: 123 U/L (ref 38–126)
Anion gap: 9 (ref 5–15)
CHLORIDE: 91 mmol/L — AB (ref 101–111)
CO2: 36 mmol/L — AB (ref 22–32)
CREATININE: 0.5 mg/dL (ref 0.44–1.00)
Calcium: 8.7 mg/dL — ABNORMAL LOW (ref 8.9–10.3)
Glucose, Bld: 99 mg/dL (ref 65–99)
POTASSIUM: 3.6 mmol/L (ref 3.5–5.1)
SODIUM: 136 mmol/L (ref 135–145)
Total Bilirubin: 0.9 mg/dL (ref 0.3–1.2)
Total Protein: 5.5 g/dL — ABNORMAL LOW (ref 6.5–8.1)

## 2014-10-03 LAB — MAGNESIUM: Magnesium: 1.9 mg/dL (ref 1.7–2.4)

## 2014-10-03 MED ORDER — OXYCODONE HCL 5 MG PO TABS: 5.0000 mg | ORAL_TABLET | Freq: Four times a day (QID) | ORAL | Status: DC | PRN

## 2014-10-03 MED ORDER — DILTIAZEM HCL ER COATED BEADS 360 MG PO CP24: 360.0000 mg | ORAL_CAPSULE | Freq: Every day | ORAL | Status: AC

## 2014-10-03 MED ORDER — METOPROLOL TARTRATE 25 MG PO TABS
12.5000 mg | ORAL_TABLET | Freq: Two times a day (BID) | ORAL | Status: DC
Start: 2014-10-03 — End: 2014-10-20

## 2014-10-03 MED ORDER — ASPIRIN 81 MG PO TBEC
81.0000 mg | DELAYED_RELEASE_TABLET | Freq: Every day | ORAL | Status: DC
Start: 2014-10-03 — End: 2016-05-13

## 2014-10-03 MED ORDER — ENOXAPARIN SODIUM 40 MG/0.4ML ~~LOC~~ SOLN: 40.0000 mg | SUBCUTANEOUS | Status: DC

## 2014-10-03 MED ORDER — ALPRAZOLAM 0.25 MG PO TABS: 0.2500 mg | ORAL_TABLET | Freq: Every day | ORAL | Status: DC

## 2014-10-03 NOTE — Progress Notes (Signed)
Ellen [name] is a 81 y.o. female patient who transferred  from [facility] awake, alert  & orientated  X1, Full Code, VSS - Blood pressure 141/71, pulse 77, temperature 98.2 F (36.8 C), temperature source Oral, resp. rate 18, height  (1.575 m), weight 61.3 kg (135 lb 2.3 oz), SpO2 99 %., O2   @ 2L nasal cannular, no c/o shortness of breath, no c/o chest pain, no distress noted. Tele # 14 placed and pt is currently running:NSR   IV site WDL: rt forearm  Allergies:   Allergies  Allergen Reactions  . Doxycycline Other (See Comments)    Destroyed good bacteria in GI tract  . Clarithromycin Other (See Comments)    H/A .[name]..patient unsure of reaction    . Prinivil [Lisinopril] Other (See Comments)    Patient unsure of reaction  . Propulsid [Cisapride] Other (See Comments)    Patient unsure of reaction     Past Medical History  Diagnosis Date  . Chronic airway obstruction, not elsewhere classified   . Allergic rhinitis, cause unspecified   . Esophageal reflux   . Iron deficiency anemia, unspecified   . Coronary atherosclerosis of unspecified type of vessel, native or graft   . Dysfunction of eustachian tube   . Obesity, unspecified   . Macular degeneration, right eye Being treated.  . Macular degeneration, left eye Blind     Pt orientation to unit.  Abrasion on lt ankle. Blister noted on lt buttock an sacrum, foam dressing in place.   Will cont to monitor and assist as needed.  Lendell Caprice, RN 10/03/2014 4:15 AM

## 2014-10-03 NOTE — Care Management Note (Signed)
Case Management Note  Patient Details  Name: Ellen [name] MRN: [id number] Date of Birth: 10/25/1926  Subjective/Objective:                 Discharge to SNF.  Action/Plan:   Expected Discharge Date:                  Expected Discharge Plan:  Skilled Nursing Facility  In-House Referral:  Clinical Social Work  Discharge planning Services  CM Consult  Post Acute Care Choice:    Choice offered to:     DME Arranged:    DME Agency:     HH Arranged:    HH Agency:     Status of Service:  Completed, signed off  Medicare Important Message Given:  Yes-second notification given Date Medicare IM Given:    Medicare IM give by:    Date Additional Medicare IM Given:    Additional Medicare Important Message give by:     If discussed at Long Length of Stay Meetings, dates discussed:    Additional Comments:  Lawerance Sabal, RN 10/03/2014, 11:24 AM

## 2014-10-03 NOTE — Clinical Social Work Placement (Signed)
   CLINICAL SOCIAL WORK PLACEMENT  NOTE  Date:  10/03/2014  Patient Details  Name: Ellen [name] MRN: [id number] Date of Birth: [birth year]  Clinical Social Work is seeking post-discharge placement for this patient at the Skilled  Nursing Facility level of care (*CSW will initial, date and re-position this form in  chart as items are completed):  Yes   Patient/family provided with [facility] Clinical Social Work Department's list of facilities offering this level of care within the geographic area requested by the patient (or if unable, by the patient's family).  Yes   Patient/family informed of their freedom to choose among providers that offer the needed level of care, that participate in Medicare, Medicaid or managed care program needed by the patient, have an available bed and are willing to accept the patient.  Yes   Patient/family informed of [facility]'s ownership interest in [facility] and [facility], as well as of the fact that they are under no obligation to receive care at these facilities.  PASRR submitted to EDS on 09/28/14     PASRR number received on 09/28/14     Existing PASRR number confirmed on       FL2 transmitted to all facilities in geographic area requested by pt/family on       FL2 transmitted to all facilities within larger geographic area on 09/28/14     Patient informed that his/her managed care company has contracts with or will negotiate with certain facilities, including the following:        Yes   Patient/family informed of bed offers received.  Patient chooses bed at [facility], [facility]     Physician recommends and patient chooses bed at      Patient to be transferred to [facility] on 10/03/14.  Patient to be transferred to facility by ambulance     Patient family notified on 10/03/14 of transfer.  Name of family member notified:  Debbie     PHYSICIAN Please sign FL2     Additional Comment:  Per  MD patient ready for DC to [facility]. RN, patient, patient's family, and facility notified of DC. RN given number for report. DC packet on chart. Ambulance transport requested for patient for 2:00PM. CSW has confirmed with Hillery Hunter at facility that patient can come at this time CSW signing off.   _______________________________________________ Roddie Mc MSW, [place], [place], [phone]

## 2014-10-03 NOTE — Discharge Summary (Signed)
Ellen [name], is a 81 y.o. female  DOB Dec 19, 1926  MRN [id number].  Admission date:  09/25/2014  Admitting Physician  Jerald Kief, MD  Discharge Date:  10/03/2014   Primary MD  Shamleffer, Landry Mellow, MD  Recommendations for primary care physician for things to follow:   Check CBC, BMP in 3-4 days.  Follow-up with orthopedics Dr. Ophelia Charter in a week, confirm Lovenox duration.  Follow with cardiology and GI as dictated below. In the next 1-2 weeks.   Admission Diagnosis  Closed left hip fracture, initial encounter [S72.002A]   Discharge Diagnosis  Closed left hip fracture, initial encounter [S72.002A]     Active Problems:   Iron deficiency anemia   Coronary atherosclerosis   GERD   Essential hypertension, benign   Closed left hip fracture   Hip fracture   Atrial fibrillation with RVR   Left displaced femoral neck fracture   CAD in native artery   Cardiomyopathy, ischemic   Essential hypertension   Pulmonary hypertension   Trapezius muscle spasm   Neck pain   Hypomagnesemia   Cardiomyopathy      Past Medical History  Diagnosis Date  . Chronic airway obstruction, not elsewhere classified   . Allergic rhinitis, cause unspecified   . Esophageal reflux   . Iron deficiency anemia, unspecified   . Coronary atherosclerosis of unspecified type of vessel, native or graft   . Dysfunction of eustachian tube   . Obesity, unspecified   . Macular degeneration, right eye Being treated.  . Macular degeneration, left eye Blind     Past Surgical History  Procedure Laterality Date  . Coronary artery bypass graft    . Hip arthroplasty Left 09/26/2014    Procedure: ARTHROPLASTY MONOPOLAR HIP (HEMIARTHROPLASTY);  Surgeon: Eldred Manges, MD;  Location: [facility] OR;  Service: Orthopedics;  Laterality: Left;       HPI  from the history and physical done on the day of admission:    81 year old female with a history of oxygen dependent COPD, allergic rhinitis, coronary artery disease, iron deficiency anemia, gastroesophageal reflux disease, seen in the ER on 7/31, after a fall at a memory care facility. She did not lose consciousness. According to the staff and EMS the patient was back to her baseline and was discharged back to the facility. She was brought back in by EMS today on 8/7 with complaints of left hip pain for 2 days. No recurrent falls since 7/31.She was also noted to have bruising at her right distal forearm yesterday. She has a history of atrial fibrillation not on anticoagulation and was found to be in rapid A. fib with a heart rate of 132 for which she was placed on a Cardizem drip. Patient transferred to [facility] for orthopedic consultation for displaced, non-comminuted fracture of L femoral neck on imaging.Daughter at bedside, POA. Daughter states she ambulates with walker on her own. Sundowns almost every night. Had a fall 7 days ago, found on floor. Was able to walk all week , no  other falls. Last 24 hrs hip short and externally rotated with apparent pain attempting to ambulate. Has lost weight more than 20 lbs with dementia     Hospital Course:     1. Fall at nursing home with displaced left femoral neck fracture. Status post ORIF by Dr. Ophelia Charter, weightbearing as tolerated, Lovenox for DVT prophylaxis, has tolerated the procedure well. Will require continued PT and SNF placement. We'll request SNF to kindly ensure patient follows with Dr. Ophelia Charter within a week to confirm the total duration of Lovenox for DVT prophylaxis.   2. New onset atrial fibrillation with RVR.[place] VASC 2 score of over 2. Seen by cardiology, has been placed on comminution of Cardizem and metoprolol, recent echo shows a preserved EF of 60%. Due to advanced age, dementia and multiple falls poor candidate for  anticoagulation. Currently on 81 mg of aspirin. Will request outpatient cardiology follow-up in a week he did goal will be rate controlled.   3. Chronic dementia with intermittent delirium. Supportive care. At risk for aspiration and falls, feeding assistance aspiration precautions and fall precautions. Currently on dysphagia 1 diet. Continue to have spaced therapy monitor the patient. Feeding assistance. Continue Namenda.   4. CAD. No acute issues. Continue comminution of aspirin and beta blocker. Outpatient cardiology follow-up in the next 1-2 weeks post discharge.   5. Essential hypertension. Stable on present regimen.   6. History of chronic pulmonary hypertension. No acute issues outpatient follow with PCP and pulmonary as needed. Supportive care.   7. GERD. On PPI continue.   Discharge Condition: Fair  Follow UP  Follow-up Information    Schedule an appointment as soon as possible for a visit with Eldred Manges, MD.   Specialty:  Orthopedic Surgery   Why:  need return office visit 1 weeks postop   Contact information:   [address] [place] [address] [phone]       Follow up with Shamleffer, Landry Mellow, MD. Schedule an appointment as soon as possible for a visit in 3 days.   Specialty:  Internal Medicine   Contact information:   [address]  [address] [place] [address] [phone]       Follow up with [facility] [address] Schedule an appointment as soon as possible for a visit in 1 week.   Why:  Atrial fibrillation   Contact information:   [address] [place] [address]       Follow up with Stan Head, MD. Schedule an appointment as soon as possible for a visit in 1 week.   Specialty:  Gastroenterology   Why:  Nonspecific CT findings on the liver   Contact information:   [address] [place] [address] [phone]        Consults obtained - Ortho, Cards  Diet and Activity  recommendation: See Discharge Instructions below  Discharge Instructions           Discharge Instructions    Discharge instructions    Complete by:  As directed   Follow with Primary MD Shamleffer, Landry Mellow, MD in 3 days   Get CBC, CMP, 2 view Chest X ray checked  by Primary MD next visit.    Activity: As tolerated with Full fall precautions use walker/cane & assistance as needed   Disposition SNF   Diet: Dysphagia 1 diet with full feeding assistance and aspiration precautions.  For Heart failure patients - Check your Weight same time everyday,  if you gain over 2 pounds, or you develop in leg swelling, experience more shortness of breath or chest pain, call your Primary MD immediately. Follow Cardiac Low Salt Diet and 1.5 lit/day fluid restriction.   On your next visit with your primary care physician please Get Medicines reviewed and adjusted.   Please request your Prim.MD to go over all Hospital Tests and Procedure/Radiological results at the follow up, please get all Hospital records sent to your Prim MD by signing hospital release before you go home.   If you experience worsening of your admission symptoms, develop shortness of breath, life threatening emergency, suicidal or homicidal thoughts you must seek medical attention immediately by calling 911 or calling your MD immediately  if symptoms less severe.  You Must read complete instructions/literature along with all the possible adverse reactions/side effects for all the Medicines you take and that have been prescribed to you. Take any new Medicines after you have completely understood and accpet all the possible adverse reactions/side effects.   Do not drive, operating heavy machinery, perform activities at heights, swimming or participation in water activities or provide baby sitting services if your were admitted for syncope or siezures until you have seen by Primary MD or a Neurologist and advised to do so  again.  Do not drive when taking Pain medications.    Do not take more than prescribed Pain, Sleep and Anxiety Medications  Special Instructions: If you have smoked or chewed Tobacco  in the last 2 yrs please stop smoking, stop any regular Alcohol  and or any Recreational drug use.  Wear Seat belts while driving.   Please note  You were cared for by a hospitalist during your hospital stay. If you have any questions about your discharge medications or the care you received while you were in the hospital after you are discharged, you can call the unit and asked to speak with the hospitalist on call if the hospitalist that took care of you is not available. Once you are discharged, your primary care physician will handle any further medical issues. Please note that NO REFILLS for any discharge medications will be authorized once you are discharged, as it is imperative that you return to your primary care physician (or establish a relationship with a primary care physician if you do not have one) for your aftercare needs so that they can reassess your need for medications and monitor your lab values.     Increase activity slowly    Complete by:  As directed      Increase activity slowly    Complete by:  As directed              Discharge Medications       Medication List    STOP taking these medications        losartan 50 MG tablet  Commonly known as:  COZAAR     nadolol 80 MG tablet  Commonly known as:  CORGARD     pravastatin 20 MG tablet  Commonly known as:  PRAVACHOL     traMADol 50 MG tablet  Commonly known as:  ULTRAM      TAKE these medications        ALPRAZolam 0.25 MG tablet  Commonly known as:  XANAX  Take 1 tablet (0.25 mg total) by mouth at bedtime.     aspirin 81 MG EC tablet  Take 1 tablet (81 mg total) by mouth daily with breakfast.  CALCIUM SOFT CHEWS PO  Take 2 tablets by mouth daily.     cholecalciferol 1000 UNITS tablet  Commonly known as:   VITAMIN D  Take 2,000 Units by mouth daily.     diltiazem 360 MG 24 hr capsule  Commonly known as:  CARDIZEM CD  Take 1 capsule (360 mg total) by mouth daily.     docusate sodium 100 MG capsule  Commonly known as:  COLACE  Take 1 capsule (100 mg total) by mouth 2 (two) times daily.     enoxaparin 40 MG/0.4ML injection  Commonly known as:  LOVENOX  Inject 0.4 mLs (40 mg total) into the skin daily. For 2 weeks then as per orthopedic surgeon Dr. Ophelia Charter.     esomeprazole 40 MG capsule  Commonly known as:  NEXIUM  Take 20 mg by mouth daily.     EYE VITAMINS Caps  Take 1 capsule by mouth daily.     furosemide 20 MG tablet  Commonly known as:  LASIX  Take 1 tablet (20 mg total) by mouth daily.     metoprolol tartrate 25 MG tablet  Commonly known as:  LOPRESSOR  Take 0.5 tablets (12.5 mg total) by mouth 2 (two) times daily.     NAMENDA XR 28 MG Cp24 24 hr capsule  Generic drug:  memantine  Take 28 mg by mouth daily.     NITROSTAT 0.4 MG SL tablet  Generic drug:  nitroGLYCERIN  Place 0.4 mg under the tongue every 5 (five) minutes as needed for chest pain. Use as directed as needed     oxyCODONE 5 MG immediate release tablet  Commonly known as:  Oxy IR/ROXICODONE  Take 1 tablet (5 mg total) by mouth every 6 (six) hours as needed for moderate pain.     potassium chloride SA 20 MEQ tablet  Commonly known as:  KLOR-CON M20  Take 1 tablet (20 mEq total) by mouth daily.     sertraline 50 MG tablet  Commonly known as:  ZOLOFT  Take 50 mg by mouth daily.        Major procedures and Radiology Reports - PLEASE review detailed and final reports for all details, in brief -   L Hip ORIF   Dg Chest 1 View  09/25/2014   CLINICAL DATA:  Pt states she fell at nursing home 2 days ago. States chronic sob. Denies chest pain. Hx of cabg.  EXAM: CHEST  1 VIEW  COMPARISON:  09/06/2013  FINDINGS: Changes from CABG surgery are stable. Cardiac silhouette is mildly enlarged. No mediastinal or  hilar masses or convincing adenopathy.  Lungs show areas of reticular nodular opacity reflecting dilated partly mucous filled bronchi. These findings are stable. There is elevation of left hemidiaphragm, also stable.  No acute lung consolidation and no evidence of pulmonary edema. No pleural effusion or pneumothorax.  Bony thorax is demineralized but grossly intact.  IMPRESSION: 1. No acute cardiopulmonary disease.   Electronically Signed   By: Amie Portland M.D.   On: 09/25/2014 10:32   Dg Thoracic Spine 2 View  09/18/2014   CLINICAL DATA:  Acute upper thoracic pain following fall today. Initial encounter.  EXAM: THORACIC SPINE 2 VIEWS  COMPARISON:  07/04/2014 and prior chest radiographs  FINDINGS: Normal alignment is noted.  There is no evidence of acute fracture or subluxation.  Mild multilevel degenerative disc disease identified.  No focal bony lesions are identified.  CABG changes are again noted.  IMPRESSION: No evidence of acute bony  abnormality.   Electronically Signed   BHarmon Pierey  Hu M.D.   On: 09/18/2014 08:17   Dg Shoulder Right  09/18/2014   CLINICAL DATA:  Unwitnessed fall.  Shoulder pain  EXAM: RIGHT SHOULDER - 2+ VIEW  COMPARISON:  None.  FINDINGS: Glenohumeral joint is intact. No evidence of scapular fracture or humeral fracture. The acromioclavicular joint is intact.  IMPRESSION: No fracture or dislocation.   Electronically Signed   By: Genevive Bi M.D.   On: 09/18/2014 08:16   Ct Head Wo Contrast  09/18/2014   CLINICAL DATA:  Unwitnessed fall, now with posterior neck pain.  EXAM: CT HEAD WITHOUT CONTRAST  CT CERVICAL SPINE WITHOUT CONTRAST  TECHNIQUE: Multidetector CT imaging of the head and cervical spine was performed following the standard protocol without intravenous contrast. Multiplanar CT image reconstructions of the cervical spine were also generated.  COMPARISON:  Head CT - 05/18/2012  FINDINGS: CT HEAD FINDINGS  Examination is minimally degraded secondary to patient  motion, necessitating the acquisition of additional images.  Similar findings of advanced atrophy with sulcal prominence and centralized volume loss with commensurate ex vacuo dilatation the ventricular system. Scattered periventricular hypodensities compatible with microvascular ischemic disease. Unchanged punctate calcification in the right basal ganglia. Given extensive background parenchymal abnormalities, there is no CT evidence of superimposed acute large territory infarct. No intraparenchymal or extra-axial mass or hemorrhage. Unchanged size and configuration of the ventricles and basilar cisterns. No midline shift. Intracranial atherosclerosis.  Regional soft tissues appear normal. Post bilateral cataract surgery. Limited visualization the paranasal sinuses and mastoid air cells is normal. No air-fluid levels. No displaced calvarial fracture.  CT CERVICAL SPINE FINDINGS  C1 to the superior endplate of T3 is imaged.  There is minimal (approximately 1-2 mm) of anterolisthesis of C3 upon C4 and C7 upon T1. The dens is normally positioned between the lateral masses of C1. Normal atlantodental and atlantoaxial articulations. The bilateral facets are normally aligned.  No fracture or static subluxation of cervical spine. Cervical vertebral body heights are preserved. Prevertebral soft tissues are normal.  Mild to moderate multilevel cervical spine DDD, worse at C5-C6 and C6-C7 with disc space height loss, endplate irregularity and small posteriorly directed disc osteophyte complexes at these locations. There is partial ossification the nuchal ligament posterior to the C3 spinous process.  Atherosclerotic plaque within the bilateral carotid bulbs. There is mild diffuse heterogeneity of the thyroid parenchyma without discrete nodule on this noncontrast examination. No bulky cervical lymphadenopathy on this noncontrast examination.  Limited visualization of lung apices demonstrates ill-defined slightly nodular  opacities within the imaged portions of the bilateral lung apices (representative images 75 and 83, series 7).  IMPRESSION: 1. Similar findings of advanced atrophy and microvascular ischemic disease without acute intracranial process. 2. No fracture or static subluxation of the cervical spine. 3. Mild to moderate multilevel cervical spine DDD, worse at C5-C6 and C6-C7.   Electronically Signed   By: Simonne Come M.D.   On: 09/18/2014 08:13   Ct Cervical Spine Wo Contrast  09/18/2014   CLINICAL DATA:  Unwitnessed fall, now with posterior neck pain.  EXAM: CT HEAD WITHOUT CONTRAST  CT CERVICAL SPINE WITHOUT CONTRAST  TECHNIQUE: Multidetector CT imaging of the head and cervical spine was performed following the standard protocol without intravenous contrast. Multiplanar CT image reconstructions of the cervical spine were also generated.  COMPARISON:  Head CT - 05/18/2012  FINDINGS: CT HEAD FINDINGS  Examination is minimally degraded secondary to patient motion, necessitating the acquisition  of additional images.  Similar findings of advanced atrophy with sulcal prominence and centralized volume loss with commensurate ex vacuo dilatation the ventricular system. Scattered periventricular hypodensities compatible with microvascular ischemic disease. Unchanged punctate calcification in the right basal ganglia. Given extensive background parenchymal abnormalities, there is no CT evidence of superimposed acute large territory infarct. No intraparenchymal or extra-axial mass or hemorrhage. Unchanged size and configuration of the ventricles and basilar cisterns. No midline shift. Intracranial atherosclerosis.  Regional soft tissues appear normal. Post bilateral cataract surgery. Limited visualization the paranasal sinuses and mastoid air cells is normal. No air-fluid levels. No displaced calvarial fracture.  CT CERVICAL SPINE FINDINGS  C1 to the superior endplate of T3 is imaged.  There is minimal (approximately 1-2 mm) of  anterolisthesis of C3 upon C4 and C7 upon T1. The dens is normally positioned between the lateral masses of C1. Normal atlantodental and atlantoaxial articulations. The bilateral facets are normally aligned.  No fracture or static subluxation of cervical spine. Cervical vertebral body heights are preserved. Prevertebral soft tissues are normal.  Mild to moderate multilevel cervical spine DDD, worse at C5-C6 and C6-C7 with disc space height loss, endplate irregularity and small posteriorly directed disc osteophyte complexes at these locations. There is partial ossification the nuchal ligament posterior to the C3 spinous process.  Atherosclerotic plaque within the bilateral carotid bulbs. There is mild diffuse heterogeneity of the thyroid parenchyma without discrete nodule on this noncontrast examination. No bulky cervical lymphadenopathy on this noncontrast examination.  Limited visualization of lung apices demonstrates ill-defined slightly nodular opacities within the imaged portions of the bilateral lung apices (representative images 75 and 83, series 7).  IMPRESSION: 1. Similar findings of advanced atrophy and microvascular ischemic disease without acute intracranial process. 2. No fracture or static subluxation of the cervical spine. 3. Mild to moderate multilevel cervical spine DDD, worse at C5-C6 and C6-C7.   Electronically Signed   By: Simonne Come M.D.   On: 09/18/2014 08:13   Dg Chest Port 1 View  09/28/2014   CLINICAL DATA:  Initial encounter for worsening shortness of breath today.  EXAM: PORTABLE CHEST - 1 VIEW  COMPARISON:  09/25/2014.  FINDINGS: 0835 hours. Low volume film. No overt airspace pulmonary edema or focal lung consolidation. There is atelectasis at the left lung base. Interstitial markings are diffusely coarsened with chronic features. The cardio pericardial silhouette is enlarged. Patient is status post CABG. Telemetry leads overlie the chest. Bones are diffusely demineralized.   IMPRESSION: Low volume film without acute cardiopulmonary findings.   Electronically Signed   By: Kennith Center M.D.   On: 09/28/2014 08:43   Dg Hip Port Unilat With Pelvis 1v Left  09/26/2014   CLINICAL DATA:  81 year old female status post left hip replacement  EXAM: DG HIP (WITH OR WITHOUT PELVIS) 1V PORT LEFT  COMPARISON:  Preoperative radiographs 09/25/2014  FINDINGS: Interval left hip hemi arthroplasty. No evidence of acute abnormality. Expected postoperative subcutaneous emphysema. The visualized bony pelvis and right femur appear intact.  IMPRESSION: Surgical changes of left hip hemi arthroplasty without evidence of immediate complication.   Electronically Signed   By: Malachy Moan M.D.   On: 09/26/2014 18:37   Dg Hip Unilat With Pelvis 2-3 Views Left  09/25/2014   CLINICAL DATA:  Larey Seat 2 days ago at the nursing home. Left hip pain since.  EXAM: DG HIP (WITH OR WITHOUT PELVIS) 2-3V LEFT  COMPARISON:  None.  FINDINGS: There is a displaced, non comminuted fracture of the left  femoral neck. This is in mid cervical femoral neck fracture. Shaft component has retracted superiorly displacing 15 mm. There is also apex anterior angulation of approximately 30 degrees as well as significant varus angulation.  There are no other fractures. Bones are diffusely demineralized. There is superior lateral hip joint space narrowing on the left.  Soft tissues demonstrate vascular calcifications but are otherwise unremarkable.  IMPRESSION: 1. Displaced, non comminuted fracture of the left femoral neck with varus and apex anterior angulation.   Electronically Signed   By: Amie Portland M.D.   On: 09/25/2014 10:29   US Abdomen Limited Ruq  09/28/2014   CLINICAL DATA:  Right upper quadrant abdominal pain.  EXAM: US ABDOMEN LIMITED - RIGHT UPPER QUADRANT  COMPARISON:  CT scan of March 09, 2009.  FINDINGS: Gallbladder:  Status post cholecystectomy.  Common bile duct:  Diameter: 5.3 mm which is within normal limits.   Liver:  Heterogeneous echotexture of hepatic parenchyma is noted. 1.6 cm cyst is seen in left hepatic lobe. 8 mm cyst is seen anteriorly in right hepatic lobe. 1.8 cm echogenic focus is seen in left hepatic lobe most consistent with hemangioma in the absence of any history of primary malignancy.  IMPRESSION: Status post cholecystectomy. No evidence of intrahepatic or extrahepatic biliary dilatation.  Multiple small hepatic cysts are noted. Heterogeneous echotexture of hepatic parenchyma is noted suggesting diffuse hepatocellular disease such as hepatitis.  1.8 mm echogenic focus seen anteriorly in left hepatic lobe ; in the absence of any history of malignancy, this most likely represents benign hemangioma, and followup ultrasound in 6 months would be recommended to ensure stability. If the patient has a history of malignancy, then MRI would be recommended.   Electronically Signed   By: Lupita Raider, M.D.   On: 09/28/2014 11:02    Micro Results      Recent Results (from the past 240 hour(s))  MRSA PCR Screening     Status: None   Collection Time: 09/25/14  3:26 PM  Result Value Ref Range Status   MRSA by PCR NEGATIVE NEGATIVE Final    Comment:        The GeneXpert MRSA Assay (FDA approved for NASAL specimens only), is one component of a comprehensive MRSA colonization surveillance program. It is not intended to diagnose MRSA infection nor to guide or monitor treatment for MRSA infections.   Urine culture     Status: None   Collection Time: 09/26/14  8:17 AM  Result Value Ref Range Status   Specimen Description URINE, CATHETERIZED  Final   Special Requests NONE  Final   Culture 20,000 COLONIES/mL LACTOBACILLUS SPECIES  Final   Report Status 09/28/2014 FINAL  Final       Today   Subjective    [name] today remains confused, but in no distress. Says no to all questions   Objective   Blood pressure 154/69, pulse 86, temperature 97.7 F (36.5 C), temperature source  Oral, resp. rate 18, height 5\' 2"  (1.575 m), weight 61.3 kg (135 lb 2.3 oz), SpO2 92 %.   Intake/Output Summary (Last 24 hours) at 10/03/14 1003 Last data filed at 10/02/14 2147  Gross per 24 hour  Intake    390 ml  Output    800 ml  Net   -410 ml    Exam Awake but completely confused, No new F.N deficits, Normal affect [place].AT,PERRAL Supple Neck,No JVD, No cervical lymphadenopathy appriciated.  Symmetrical Chest wall movement, Good air movement bilaterally, CTAB  iRRR,No Gallops,Rubs or new Murmurs, No Parasternal Heave +ve B.Sounds, Abd Soft, Non tender, No organomegaly appriciated, No rebound -guarding or rigidity. No Cyanosis, Clubbing or edema, No new Rash or bruise, left is post op site appears stable   Data Review   CBC w Diff:  Lab Results  Component Value Date   WBC 6.6 10/03/2014   WBC 6.7 07/07/2014   WBC 6.6 02/16/2009   HGB 11.6* 10/03/2014   HGB 12.2 07/07/2014   HGB 12.1 02/16/2009   HCT 35.5* 10/03/2014   HCT 37.4 07/07/2014   HCT 36.7 02/16/2009   PLT 308 10/03/2014   PLT 276 07/07/2014   PLT 311 02/16/2009   LYMPHOPCT 15 10/03/2014   LYMPHOPCT 24.3 07/07/2014   LYMPHOPCT 17.1 02/16/2009   MONOPCT 8 10/03/2014   MONOPCT 10.5 07/07/2014   MONOPCT 7.5 02/16/2009   EOSPCT 2 10/03/2014   EOSPCT 3.9 07/07/2014   EOSPCT 2.9 02/16/2009   BASOPCT 0 10/03/2014   BASOPCT 0.7 07/07/2014   BASOPCT 0.6 02/16/2009    CMP:  Lab Results  Component Value Date   NA 136 10/03/2014   K 3.6 10/03/2014   CL 91* 10/03/2014   CO2 36* 10/03/2014   BUN <5* 10/03/2014   CREATININE 0.50 10/03/2014   PROT 5.5* 10/03/2014   ALBUMIN 2.6* 10/03/2014   BILITOT 0.9 10/03/2014   ALKPHOS 123 10/03/2014   AST 28 10/03/2014   ALT 25 10/03/2014  .   Total Time in preparing paper work, data evaluation and todays exam - 35 minutes  Leroy Sea M.D on 10/03/2014 at 10:03 AM  [facility]   Office  [phone]

## 2014-10-03 NOTE — Care Management Important Message (Signed)
Important Message  Patient Details  Name: Ellen [name] MRN: [id number] Date of Birth: [birth year]   Medicare Important Message Given:  Yes-third notification given    Kyla Balzarine 10/03/2014, 11:27 AMImportant Message  Patient Details  Name: Ellen [name] MRN: [id number] Date of Birth: [birth year]   Medicare Important Message Given:  Yes-third notification given    Kyla Balzarine 10/03/2014, 11:27 AM

## 2014-10-03 NOTE — Discharge Instructions (Signed)
Follow with Primary MD Shamleffer, Landry Mellow, MD in 3 days   Get CBC, CMP, 2 view Chest X ray checked  by Primary MD next visit.    Activity: As tolerated with Full fall precautions use walker/cane & assistance as needed   Disposition SNF   Diet: Dysphagia 1 diet with full feeding assistance and aspiration precautions.  For Heart failure patients - Check your Weight same time everyday, if you gain over 2 pounds, or you develop in leg swelling, experience more shortness of breath or chest pain, call your Primary MD immediately. Follow Cardiac Low Salt Diet and 1.5 lit/day fluid restriction.   On your next visit with your primary care physician please Get Medicines reviewed and adjusted.   Please request your Prim.MD to go over all Hospital Tests and Procedure/Radiological results at the follow up, please get all Hospital records sent to your Prim MD by signing hospital release before you go home.   If you experience worsening of your admission symptoms, develop shortness of breath, life threatening emergency, suicidal or homicidal thoughts you must seek medical attention immediately by calling 911 or calling your MD immediately  if symptoms less severe.  You Must read complete instructions/literature along with all the possible adverse reactions/side effects for all the Medicines you take and that have been prescribed to you. Take any new Medicines after you have completely understood and accpet all the possible adverse reactions/side effects.   Do not drive, operating heavy machinery, perform activities at heights, swimming or participation in water activities or provide baby sitting services if your were admitted for syncope or siezures until you have seen by Primary MD or a Neurologist and advised to do so again.  Do not drive when taking Pain medications.    Do not take more than prescribed Pain, Sleep and Anxiety Medications  Special Instructions: If you have smoked or  chewed Tobacco  in the last 2 yrs please stop smoking, stop any regular Alcohol  and or any Recreational drug use.  Wear Seat belts while driving.   Please note  You were cared for by a hospitalist during your hospital stay. If you have any questions about your discharge medications or the care you received while you were in the hospital after you are discharged, you can call the unit and asked to speak with the hospitalist on call if the hospitalist that took care of you is not available. Once you are discharged, your primary care physician will handle any further medical issues. Please note that NO REFILLS for any discharge medications will be authorized once you are discharged, as it is imperative that you return to your primary care physician (or establish a relationship with a primary care physician if you do not have one) for your aftercare needs so that they can reassess your need for medications and monitor your lab values.     INSTRUCTIONS AFTER JOINT REPLACEMENT   o Remove items at home which could result in a fall. This includes throw rugs or furniture in walking pathways o ICE to the affected joint every three hours while awake for 30 minutes at a time, for at least the first 3-5 days, and then as needed for pain and swelling.  Continue to use ice for pain and swelling. You may notice swelling that will progress down to the foot and ankle.  This is normal after surgery.  Elevate your leg when you are not up walking on it.   o Continue to use  the breathing machine you got in the hospital (incentive spirometer) which will help keep your temperature down.  It is common for your temperature to cycle up and down following surgery, especially at night when you are not up moving around and exerting yourself.  The breathing machine keeps your lungs expanded and your temperature down.   DIET:  As you were doing prior to hospitalization, we recommend a well-balanced diet.  DRESSING / WOUND  CARE / SHOWERING  You may shower 3 days after surgery, but keep the wounds dry during showering.  You may use an occlusive plastic wrap (Press'n Seal for example), NO SOAKING/SUBMERGING IN THE BATHTUB.  If the bandage gets wet, change with a clean dry gauze.  If the incision gets wet, pat the wound dry with a clean towel.  ACTIVITY  o Increase activity slowly as tolerated, but follow the weight bearing instructions below.   o No driving for 6 weeks or until further direction given by your physician.  You cannot drive while taking narcotics.  o No lifting or carrying greater than 10 lbs. until further directed by your surgeon. o Avoid periods of inactivity such as sitting longer than an hour when not asleep. This helps prevent blood clots.  o You may return to work once you are authorized by your doctor.     WEIGHT BEARING   Weight bearing as tolerated with assist device (walker, cane, etc) as directed, use it as long as suggested by your surgeon or therapist, typically at least 4-6 weeks.   EXERCISES  Results after joint replacement surgery are often greatly improved when you follow the exercise, range of motion and muscle strengthening exercises prescribed by your doctor. Safety measures are also important to protect the joint from further injury. Any time any of these exercises cause you to have increased pain or swelling, decrease what you are doing until you are comfortable again and then slowly increase them. If you have problems or questions, call your caregiver or physical therapist for advice.   Rehabilitation is important following a joint replacement. After just a few days of immobilization, the muscles of the leg can become weakened and shrink (atrophy).  These exercises are designed to build up the tone and strength of the thigh and leg muscles and to improve motion. Often times heat used for twenty to thirty minutes before working out will loosen up your tissues and help with  improving the range of motion but do not use heat for the first two weeks following surgery (sometimes heat can increase post-operative swelling).   These exercises can be done on a training (exercise) mat, on the floor, on a table or on a bed. Use whatever works the best and is most comfortable for you.    Use music or television while you are exercising so that the exercises are a pleasant break in your day. This will make your life better with the exercises acting as a break in your routine that you can look forward to.   Perform all exercises about fifteen times, three times per day or as directed.  You should exercise both the operative leg and the other leg as well.  Exercises include:    Quad Sets - Tighten up the muscle on the front of the thigh (Quad) and hold for 5-10 seconds.    Straight Leg Raises - With your knee straight (if you were given a brace, keep it on), lift the leg to 60 degrees, hold for 3  seconds, and slowly lower the leg.  Perform this exercise against resistance later as your leg gets stronger.   Leg Slides: Lying on your back, slowly slide your foot toward your buttocks, bending your knee up off the floor (only go as far as is comfortable). Then slowly slide your foot back down until your leg is flat on the floor again.   Angel Wings: Lying on your back spread your legs to the side as far apart as you can without causing discomfort.   Hamstring Strength:  Lying on your back, push your heel against the floor with your leg straight by tightening up the muscles of your buttocks.  Repeat, but this time bend your knee to a comfortable angle, and push your heel against the floor.  You may put a pillow under the heel to make it more comfortable if necessary.   A rehabilitation program following joint replacement surgery can speed recovery and prevent re-injury in the future due to weakened muscles. Contact your doctor or a physical therapist for more information on knee  rehabilitation.    CONSTIPATION  Constipation is defined medically as fewer than three stools per week and severe constipation as less than one stool per week.  Even if you have a regular bowel pattern at home, your normal regimen is likely to be disrupted due to multiple reasons following surgery.  Combination of anesthesia, postoperative narcotics, change in appetite and fluid intake all can affect your bowels.   YOU MUST use at least one of the following options; they are listed in order of increasing strength to get the job done.  They are all available over the counter, and you may need to use some, POSSIBLY even all of these options:    Drink plenty of fluids (prune juice may be helpful) and high fiber foods Colace 100 mg by mouth twice a day  Senokot for constipation as directed and as needed Dulcolax (bisacodyl), take with full glass of water  Miralax (polyethylene glycol) once or twice a day as needed.  If you have tried all these things and are unable to have a bowel movement in the first 3-4 days after surgery call either your surgeon or your primary doctor.    If you experience loose stools or diarrhea, hold the medications until you stool forms back up.  If your symptoms do not get better within 1 week or if they get worse, check with your doctor.  If you experience "the worst abdominal pain ever" or develop nausea or vomiting, please contact the office immediately for further recommendations for treatment.   ITCHING:  If you experience itching with your medications, try taking only a single pain pill, or even half a pain pill at a time.  You can also use Benadryl over the counter for itching or also to help with sleep.   TED HOSE STOCKINGS:  Use stockings on both legs until for at least 2 weeks or as directed by physician office. They may be removed at night for sleeping.  MEDICATIONS:  See your medication summary on the After Visit Summary that nursing will review with you.   You may have some home medications which will be placed on hold until you complete the course of blood thinner medication.  It is important for you to complete the blood thinner medication as prescribed.  PRECAUTIONS:  If you experience chest pain or shortness of breath - call 911 immediately for transfer to the hospital emergency department.   If  you develop a fever greater that 101 F, purulent drainage from wound, increased redness or drainage from wound, foul odor from the wound/dressing, or calf pain - CONTACT YOUR SURGEON.                                                   FOLLOW-UP APPOINTMENTS:  If you do not already have a post-op appointment, please call the office for an appointment to be seen by your surgeon.  Guidelines for how soon to be seen are listed in your After Visit Summary, but are typically between 1-4 weeks after surgery.  OTHER INSTRUCTIONS:   Knee Replacement:  Do not place pillow under knee, focus on keeping the knee straight while resting. CPM instructions: 0-90 degrees, 2 hours in the morning, 2 hours in the afternoon, and 2 hours in the evening. Place foam block, curve side up under heel at all times except when in CPM or when walking.  DO NOT modify, tear, cut, or change the foam block in any way.  MAKE SURE YOU:   Understand these instructions.   Get help right away if you are not doing well or get worse.    Thank you for letting us be a part of your medical care team.  It is a privilege we respect greatly.  We hope these instructions will help you stay on track for a fast and full recovery!

## 2014-10-03 NOTE — Progress Notes (Signed)
Physical Therapy Treatment Patient Details Name: Ellen [name] MRN: [id number] DOB: 06/29/1926 Today's Date: 10/03/2014    History of Present Illness Pt is an 81 y.o. female adm with lt hip fx and underwent lt hip hemiarthroplasty on 8/8. PMH  - dementia, oxygen dependent COPD, coronary artery disease, iron deficiency anemia, gerd.     PT Comments    Pt more alert today and making slow, steady progress. Continue to feel pt needs SNF at dc.  Follow Up Recommendations  SNF     Equipment Recommendations  Other (comment) (TBA)    Recommendations for Other Services       Precautions / Restrictions Precautions Precautions: Fall;Posterior Hip Precaution Booklet Issued: Yes (comment) Required Braces or Orthoses: Knee Immobilizer - Left Restrictions Weight Bearing Restrictions: Yes LLE Weight Bearing: Weight bearing as tolerated    Mobility  Bed Mobility Overal bed mobility: Needs Assistance Bed Mobility: Sit to Supine;Supine to Sit     Supine to sit: Max assist Sit to supine: Total assist   General bed mobility comments: Assist to bring legs over and elevate trunk. Assist to lower trunk and bring legs back up into bed.  Transfers Overall transfer level: Needs assistance Equipment used: Ambulation equipment used Ellen [name]) Transfers: Sit to/from Stand Sit to Stand: Max assist         General transfer comment: Able to come to partial stand and clear buttocks ~4 inches from surface of bed.  Ambulation/Gait                 Stairs            Wheelchair Mobility    Modified Rankin (Stroke Patients Only)       Balance Overall balance assessment: Needs assistance Sitting-balance support: Feet supported;Bilateral upper extremity supported Sitting balance-Leahy Scale: Poor Sitting balance - Comments: Sat EOB x 8 minutes with supervision to min A. Verbal/tactile cues to correct posterior lean. Postural control: Posterior lean                          Cognition Arousal/Alertness: Awake/alert (eyes closed but responsive to all questions/commands) Behavior During Therapy: WFL for tasks assessed/performed Overall Cognitive Status: History of cognitive impairments - at baseline Area of Impairment: Orientation;Memory;Following commands;Safety/judgement;Awareness;Problem solving Orientation Level: Place;Situation;Person   Memory: Decreased short-term memory;Decreased recall of precautions Following Commands: Follows one step commands consistently Safety/Judgement: Decreased awareness of deficits;Decreased awareness of safety Awareness:  (pre-intellectual) Problem Solving: Slow processing;Requires tactile cues;Requires verbal cues      Exercises Total Joint Exercises Ankle Circles/Pumps: AAROM;Both;10 reps;Supine    General Comments        Pertinent Vitals/Pain Pain Assessment: Faces Faces Pain Scale: Hurts little more Pain Location: lt hip with mobility Pain Descriptors / Indicators: Grimacing;Guarding Pain Intervention(s): Limited activity within patient's tolerance;Monitored during session;Repositioned    Home Living                      Prior Function            PT Goals (current goals can now be found in the care plan section) Progress towards PT goals: Progressing toward goals    Frequency  Min 2X/week    PT Plan Current plan remains appropriate    Co-evaluation             End of Session Equipment Utilized During Treatment: Gait belt;Oxygen Activity Tolerance: Patient tolerated treatment well Patient left: with call bell/phone within reach;in bed;with  bed alarm set     Time: 1204-1222 PT Time Calculation (min) (ACUTE ONLY): 18 min  Charges:  $Therapeutic Activity: 8-22 mins                    G Codes:      Ellen [name] 10/31/2014, 12:38 PM  [facility] PT [phone]

## 2014-10-03 NOTE — Progress Notes (Signed)
Speech Language Pathology Treatment: Dysphagia  Patient Details Name: Ellen [name] MRN: [id number] DOB: [birth year] Today's Date: 10/03/2014 Time: 4098-1191 SLP Time Calculation (min) (ACUTE ONLY): 15 min  Assessment / Plan / Recommendation Clinical Impression  Pt seen for dysphagia therapy and appropriateness to upgrade textures. Pt easily aroused and able to maintain alertness. Mastication of solid texture was functional without evidence of residue. No s/s aspiration during session. Min verbal reminders for swallow strategies. Recommend Dys 2 and thin liquids. Will continue to upgrade and strength and awareness continues to improve.   HPI Other Pertinent Information: Pt is an 81 y.o. female with PMH oxygen dependent COPD, allergic rhinitis, coronary artery disease, iron deficiency anemia, gastroesophageal reflux disease, seen in the ER on 7/31, after a fall at a memory care facility. She did not lose consciousness. According to the staff and EMS the patient was back to her baseline and was discharged back to the facility. She was brought back in by EMS today on 8/7 with complaints of left hip pain for 2 days. No recurrent falls since 7/31.She was also noted to have bruising at her right distal forearm yesterday. She has a history of atrial fibrillation not on anticoagulation and was found to be in rapid A. fib with a heart rate of 132. Pt sundowns almost every night. Pt has reportedly lost more than 20 lbs with dementia. Pt had surgery for L hip fracture on 8/8. CXR 8/7 showed no acute disease. Bedside swallow eval ordered.   Pertinent Vitals Pain Assessment: No/denies pain  SLP Plan  Continue with current plan of care    Recommendations Diet recommendations: Dysphagia 2 (fine chop);Thin liquid Liquids provided via: Cup;Straw Medication Administration: Whole meds with puree Supervision: Patient able to self feed;Full supervision/cueing for compensatory strategies Compensations: Slow rate;Small  sips/bites Postural Changes and/or Swallow Maneuvers: Seated upright 90 degrees              Oral Care Recommendations: Oral care BID Follow up Recommendations: Skilled Nursing facility Plan: Continue with current plan of care    GO     Royce Macadamia 10/03/2014, 12:05 PM  Breck Coons Lonell Face.Ed [employer] [phone]

## 2014-10-03 NOTE — Progress Notes (Signed)
Utilization Review completed. Everleigh Colclasure RN BSN CM 

## 2014-10-03 NOTE — Progress Notes (Signed)
Subjective: Doing well.  Pain controlled.     Objective: Vital signs in last 24 hours: Temp:  [97.5 F (36.4 C)-98.2 F (36.8 C)] 97.7 F (36.5 C) (08/15 0559) Pulse Rate:  [75-88] 86 (08/15 0559) Resp:  [18-24] 18 (08/15 0559) BP: (128-154)/(64-79) 154/69 mmHg (08/15 0559) SpO2:  [92 %-99 %] 92 % (08/15 0559) Weight:  [61.3 kg (135 lb 2.3 oz)] 61.3 kg (135 lb 2.3 oz) (08/14 2103)  Intake/Output from previous day: 08/14 0701 - 08/15 0700 In: 510 [P.O.:460; IV Piggyback:50] Out: 900 [Urine:900] Intake/Output this shift:     Recent Labs  10/02/14 0615 10/03/14 0549  HGB 11.2* 11.6*    Recent Labs  10/02/14 0615 10/03/14 0549  WBC 7.2 6.6  RBC 3.75* 3.87  HCT 34.9* 35.5*  PLT 271 308    Recent Labs  10/02/14 0615 10/03/14 0549  NA 133* 136  K 4.0 3.6  CL 90* 91*  CO2 36* 36*  BUN 8 <5*  CREATININE 0.51 0.50  GLUCOSE 92 99  CALCIUM 8.4* 8.7*   No results for input(s): LABPT, INR in the last 72 hours.  Exam:  Hip wound looks good.  Staples intact.  No drainage or signs of infection.    Assessment/Plan: Scheduled for transfer to snf today.  lovenox for postop dvt prophylaxis and oxycodone for pain.  F/u with Dr Ophelia Charter in one week for wound check, possible staple removal and xrays.  Return sooner if needed.    Ellen [name],Ellen [name] 10/03/2014, 10:29 AM

## 2014-10-04 ENCOUNTER — Telehealth: Payer: Self-pay | Admitting: Internal Medicine

## 2014-10-04 NOTE — Telephone Encounter (Signed)
Called spoke with [name] and made aware of when pt had PNA vaccine with dates. Nothing further needed

## 2014-10-11 ENCOUNTER — Encounter: Payer: Medicare HMO | Admitting: Physician Assistant

## 2014-10-13 ENCOUNTER — Encounter: Payer: Self-pay | Admitting: Physician Assistant

## 2014-10-13 ENCOUNTER — Ambulatory Visit (INDEPENDENT_AMBULATORY_CARE_PROVIDER_SITE_OTHER): Payer: Medicare HMO | Admitting: Physician Assistant

## 2014-10-13 VITALS — BP 100/56 | HR 68

## 2014-10-13 DIAGNOSIS — F0391 Unspecified dementia with behavioral disturbance: Secondary | ICD-10-CM | POA: Diagnosis not present

## 2014-10-13 DIAGNOSIS — K7689 Other specified diseases of liver: Secondary | ICD-10-CM | POA: Diagnosis not present

## 2014-10-13 DIAGNOSIS — R1310 Dysphagia, unspecified: Secondary | ICD-10-CM | POA: Diagnosis not present

## 2014-10-13 NOTE — Progress Notes (Signed)
Patient ID: Ellen [name], female   DOB: 1926-05-04, 81 y.o.   MRN: [id number]    HPI:  Ellen [name] is a 81 y.o.   female referred by Long, Campbell Lerner, MD for evaluation of dysphagia.  Ellen [name] has a past medical history of oxygen-dependent COPD, allergic rhinitis, coronary artery disease, iron deficiency anemia, gastroesophageal reflux disease, chronic dementia with intermittent delirium, and pulmonary hypertension. She presented to the emergency room at [facility] on July 31 after having sustained a fall at a memory care facility. She did not lose consciousness. Per records, the patient was back to her baseline in the emergency room and was discharged back to the facility. She was brought back to the hospital by EMS on August 7 with at complaint of 2 days of left hip pain. She was noted to have a non-comminuted fracture of the left femoral neck. She was also found to be in rapid A. fib with a heart rate of 132 for which she was placed on a Cardizem drip. She underwent an open reduction internal fixation of a left femoral neck fracture. She was also evaluated by cardiology and placed on a combination of Cardizem and metoprolol. She had an echo that showed an ejection fraction of 60%. Due to her advanced age, dementia, and history of multiple falls, she was found to be a poor candidate for anticoagulation. She also has a history of chronic dementia and intermittent delirium and over the past several months has days where she eats well and days where she doesn't want to eat. She was felt to be at risk for aspiration and so while in the hospital she had an evaluation by speech pathology and was placed on a dysphagia 1 diet and advised to have feeding assistance. She also has a history of GERD and complained of intermittent right-sided abdominal pain while in the hospital. She had an ultrasound that revealed some hepatic cysts. She is brought to the office by a nursing assistant from her memory care  facility and is accompanied by her daughter. The patient denies any abdominal pain, and the nursing assistant as well as the patient's daughter reported that the patient has not been complaining of pain. Patient has reportedly had better oral intake since back at the memory care facility.   Past Medical History  Diagnosis Date  . Chronic airway obstruction, not elsewhere classified   . Allergic rhinitis, cause unspecified   . Esophageal reflux   . Iron deficiency anemia, unspecified   . Coronary atherosclerosis of unspecified type of vessel, native or graft   . Dysfunction of eustachian tube   . Obesity, unspecified   . Macular degeneration, right eye Being treated.  . Macular degeneration, left eye Blind     Past Surgical History  Procedure Laterality Date  . Coronary artery bypass graft    . Hip arthroplasty Left 09/26/2014    Procedure: ARTHROPLASTY MONOPOLAR HIP (HEMIARTHROPLASTY);  Surgeon: Eldred Manges, MD;  Location: [facility] OR;  Service: Orthopedics;  Laterality: Left;   Family History  Problem Relation Age of Onset  . Colon cancer Brother   . Breast cancer Sister   . Heart disease Father   . Hypertension Father   . Heart disease Mother   . Hypertension Mother    Social History  Substance Use Topics  . Smoking status: Never Smoker   . Smokeless tobacco: Never Used     Comment: Never used tobacco  . Alcohol Use: None   Current Outpatient Prescriptions  Medication Sig Dispense Refill  . ALPRAZolam (XANAX) 0.25 MG tablet Take 1 tablet (0.25 mg total) by mouth at bedtime. 10 tablet 0  . aspirin EC 81 MG EC tablet Take 1 tablet (81 mg total) by mouth daily with breakfast.    . diltiazem (CARDIZEM CD) 360 MG 24 hr capsule Take 1 capsule (360 mg total) by mouth daily.    [name] enoxaparin (LOVENOX) 40 MG/0.4ML injection Inject 0.4 mLs (40 mg total) into the skin daily. For 2 weeks then as per orthopedic surgeon Dr. Ophelia Charter. 0 Syringe   . esomeprazole (NEXIUM) 40 MG capsule Take 20 mg  by mouth daily.     . furosemide (LASIX) 20 MG tablet Take 1 tablet (20 mg total) by mouth daily. 30 tablet 6  . memantine (NAMENDA XR) 28 MG CP24 24 hr capsule Take 28 mg by mouth daily.    . metoprolol tartrate (LOPRESSOR) 25 MG tablet Take 0.5 tablets (12.5 mg total) by mouth 2 (two) times daily.    . mirtazapine (REMERON) 7.5 MG tablet Take 7.5 mg by mouth at bedtime.    . Multiple Vitamins-Minerals (EYE VITAMINS) CAPS Take 1 capsule by mouth daily.     [name] NITROSTAT 0.4 MG SL tablet Place 0.4 mg under the tongue every 5 (five) minutes as needed for chest pain. Use as directed as needed    . oxyCODONE (OXY IR/ROXICODONE) 5 MG immediate release tablet Take 1 tablet (5 mg total) by mouth every 6 (six) hours as needed for moderate pain. 10 tablet 0  . potassium chloride SA (KLOR-CON M20) 20 MEQ tablet Take 1 tablet (20 mEq total) by mouth daily. 30 tablet 5  . sertraline (ZOLOFT) 50 MG tablet Take 50 mg by mouth daily.     No current facility-administered medications for this visit.   Allergies  Allergen Reactions  . Doxycycline Other (See Comments)    Destroyed good bacteria in GI tract  . Clarithromycin Other (See Comments)    H/A .[name]..patient unsure of reaction    . Prinivil [Lisinopril] Other (See Comments)    Patient unsure of reaction  . Propulsid [Cisapride] Other (See Comments)    Patient unsure of reaction     Review of Systems: Patient has dementia and is unable to complete review of systems.  Studies: Dg Chest 1 View  09/25/2014   CLINICAL DATA:  Pt states she fell at nursing home 2 days ago. States chronic sob. Denies chest pain. Hx of cabg.  EXAM: CHEST  1 VIEW  COMPARISON:  09/06/2013  FINDINGS: Changes from CABG surgery are stable. Cardiac silhouette is mildly enlarged. No mediastinal or hilar masses or convincing adenopathy.  Lungs show areas of reticular nodular opacity reflecting dilated partly mucous filled bronchi. These findings are stable. There is elevation of left  hemidiaphragm, also stable.  No acute lung consolidation and no evidence of pulmonary edema. No pleural effusion or pneumothorax.  Bony thorax is demineralized but grossly intact.  IMPRESSION: 1. No acute cardiopulmonary disease.   Electronically Signed   By: Amie Portland M.D.   On: 09/25/2014 10:32   Dg Thoracic Spine 2 View  09/18/2014   CLINICAL DATA:  Acute upper thoracic pain following fall today. Initial encounter.  EXAM: THORACIC SPINE 2 VIEWS  COMPARISON:  07/04/2014 and prior chest radiographs  FINDINGS: Normal alignment is noted.  There is no evidence of acute fracture or subluxation.  Mild multilevel degenerative disc disease identified.  No focal bony lesions are identified.  CABG changes  are again noted.  IMPRESSION: No evidence of acute bony abnormality.   Electronically Signed   By: Harmon Pier M.D.   On: 09/18/2014 08:17   Dg Shoulder Right  09/18/2014   CLINICAL DATA:  Unwitnessed fall.  Shoulder pain  EXAM: RIGHT SHOULDER - 2+ VIEW  COMPARISON:  None.  FINDINGS: Glenohumeral joint is intact. No evidence of scapular fracture or humeral fracture. The acromioclavicular joint is intact.  IMPRESSION: No fracture or dislocation.   Electronically Signed   By: Genevive Bi M.D.   On: 09/18/2014 08:16   Ct Head Wo Contrast  09/18/2014   CLINICAL DATA:  Unwitnessed fall, now with posterior neck pain.  EXAM: CT HEAD WITHOUT CONTRAST  CT CERVICAL SPINE WITHOUT CONTRAST  TECHNIQUE: Multidetector CT imaging of the head and cervical spine was performed following the standard protocol without intravenous contrast. Multiplanar CT image reconstructions of the cervical spine were also generated.  COMPARISON:  Head CT - 05/18/2012  FINDINGS: CT HEAD FINDINGS  Examination is minimally degraded secondary to patient motion, necessitating the acquisition of additional images.  Similar findings of advanced atrophy with sulcal prominence and centralized volume loss with commensurate ex vacuo dilatation the  ventricular system. Scattered periventricular hypodensities compatible with microvascular ischemic disease. Unchanged punctate calcification in the right basal ganglia. Given extensive background parenchymal abnormalities, there is no CT evidence of superimposed acute large territory infarct. No intraparenchymal or extra-axial mass or hemorrhage. Unchanged size and configuration of the ventricles and basilar cisterns. No midline shift. Intracranial atherosclerosis.  Regional soft tissues appear normal. Post bilateral cataract surgery. Limited visualization the paranasal sinuses and mastoid air cells is normal. No air-fluid levels. No displaced calvarial fracture.  CT CERVICAL SPINE FINDINGS  C1 to the superior endplate of T3 is imaged.  There is minimal (approximately 1-2 mm) of anterolisthesis of C3 upon C4 and C7 upon T1. The dens is normally positioned between the lateral masses of C1. Normal atlantodental and atlantoaxial articulations. The bilateral facets are normally aligned.  No fracture or static subluxation of cervical spine. Cervical vertebral body heights are preserved. Prevertebral soft tissues are normal.  Mild to moderate multilevel cervical spine DDD, worse at C5-C6 and C6-C7 with disc space height loss, endplate irregularity and small posteriorly directed disc osteophyte complexes at these locations. There is partial ossification the nuchal ligament posterior to the C3 spinous process.  Atherosclerotic plaque within the bilateral carotid bulbs. There is mild diffuse heterogeneity of the thyroid parenchyma without discrete nodule on this noncontrast examination. No bulky cervical lymphadenopathy on this noncontrast examination.  Limited visualization of lung apices demonstrates ill-defined slightly nodular opacities within the imaged portions of the bilateral lung apices (representative images 75 and 83, series 7).  IMPRESSION: 1. Similar findings of advanced atrophy and microvascular ischemic  disease without acute intracranial process. 2. No fracture or static subluxation of the cervical spine. 3. Mild to moderate multilevel cervical spine DDD, worse at C5-C6 and C6-C7.   Electronically Signed   By: Simonne Come M.D.   On: 09/18/2014 08:13   Ct Cervical Spine Wo Contrast  09/18/2014   CLINICAL DATA:  Unwitnessed fall, now with posterior neck pain.  EXAM: CT HEAD WITHOUT CONTRAST  CT CERVICAL SPINE WITHOUT CONTRAST  TECHNIQUE: Multidetector CT imaging of the head and cervical spine was performed following the standard protocol without intravenous contrast. Multiplanar CT image reconstructions of the cervical spine were also generated.  COMPARISON:  Head CT - 05/18/2012  FINDINGS: CT HEAD FINDINGS  Examination  is minimally degraded secondary to patient motion, necessitating the acquisition of additional images.  Similar findings of advanced atrophy with sulcal prominence and centralized volume loss with commensurate ex vacuo dilatation the ventricular system. Scattered periventricular hypodensities compatible with microvascular ischemic disease. Unchanged punctate calcification in the right basal ganglia. Given extensive background parenchymal abnormalities, there is no CT evidence of superimposed acute large territory infarct. No intraparenchymal or extra-axial mass or hemorrhage. Unchanged size and configuration of the ventricles and basilar cisterns. No midline shift. Intracranial atherosclerosis.  Regional soft tissues appear normal. Post bilateral cataract surgery. Limited visualization the paranasal sinuses and mastoid air cells is normal. No air-fluid levels. No displaced calvarial fracture.  CT CERVICAL SPINE FINDINGS  C1 to the superior endplate of T3 is imaged.  There is minimal (approximately 1-2 mm) of anterolisthesis of C3 upon C4 and C7 upon T1. The dens is normally positioned between the lateral masses of C1. Normal atlantodental and atlantoaxial articulations. The bilateral facets are  normally aligned.  No fracture or static subluxation of cervical spine. Cervical vertebral body heights are preserved. Prevertebral soft tissues are normal.  Mild to moderate multilevel cervical spine DDD, worse at C5-C6 and C6-C7 with disc space height loss, endplate irregularity and small posteriorly directed disc osteophyte complexes at these locations. There is partial ossification the nuchal ligament posterior to the C3 spinous process.  Atherosclerotic plaque within the bilateral carotid bulbs. There is mild diffuse heterogeneity of the thyroid parenchyma without discrete nodule on this noncontrast examination. No bulky cervical lymphadenopathy on this noncontrast examination.  Limited visualization of lung apices demonstrates ill-defined slightly nodular opacities within the imaged portions of the bilateral lung apices (representative images 75 and 83, series 7).  IMPRESSION: 1. Similar findings of advanced atrophy and microvascular ischemic disease without acute intracranial process. 2. No fracture or static subluxation of the cervical spine. 3. Mild to moderate multilevel cervical spine DDD, worse at C5-C6 and C6-C7.   Electronically Signed   By: Simonne Come M.D.   On: 09/18/2014 08:13   Dg Chest Port 1 View  09/28/2014   CLINICAL DATA:  Initial encounter for worsening shortness of breath today.  EXAM: PORTABLE CHEST - 1 VIEW  COMPARISON:  09/25/2014.  FINDINGS: 0835 hours. Low volume film. No overt airspace pulmonary edema or focal lung consolidation. There is atelectasis at the left lung base. Interstitial markings are diffusely coarsened with chronic features. The cardio pericardial silhouette is enlarged. Patient is status post CABG. Telemetry leads overlie the chest. Bones are diffusely demineralized.  IMPRESSION: Low volume film without acute cardiopulmonary findings.   Electronically Signed   By: Kennith Center M.D.   On: 09/28/2014 08:43   Dg Hip Port Unilat With Pelvis 1v Left  09/26/2014    CLINICAL DATA:  81 year old female status post left hip replacement  EXAM: DG HIP (WITH OR WITHOUT PELVIS) 1V PORT LEFT  COMPARISON:  Preoperative radiographs 09/25/2014  FINDINGS: Interval left hip hemi arthroplasty. No evidence of acute abnormality. Expected postoperative subcutaneous emphysema. The visualized bony pelvis and right femur appear intact.  IMPRESSION: Surgical changes of left hip hemi arthroplasty without evidence of immediate complication.   Electronically Signed   By: Malachy Moan M.D.   On: 09/26/2014 18:37   Dg Hip Unilat With Pelvis 2-3 Views Left  09/25/2014   CLINICAL DATA:  Larey Seat 2 days ago at the nursing home. Left hip pain since.  EXAM: DG HIP (WITH OR WITHOUT PELVIS) 2-3V LEFT  COMPARISON:  None.  FINDINGS:  There is a displaced, non comminuted fracture of the left femoral neck. This is in mid cervical femoral neck fracture. Shaft component has retracted superiorly displacing 15 mm. There is also apex anterior angulation of approximately 30 degrees as well as significant varus angulation.  There are no other fractures. Bones are diffusely demineralized. There is superior lateral hip joint space narrowing on the left.  Soft tissues demonstrate vascular calcifications but are otherwise unremarkable.  IMPRESSION: 1. Displaced, non comminuted fracture of the left femoral neck with varus and apex anterior angulation.   Electronically Signed   By: Amie Portland M.D.   On: 09/25/2014 10:29   US Abdomen Limited Ruq  09/28/2014   CLINICAL DATA:  Right upper quadrant abdominal pain.  EXAM: US ABDOMEN LIMITED - RIGHT UPPER QUADRANT  COMPARISON:  CT scan of March 09, 2009.  FINDINGS: Gallbladder:  Status post cholecystectomy.  Common bile duct:  Diameter: 5.3 mm which is within normal limits.  Liver:  Heterogeneous echotexture of hepatic parenchyma is noted. 1.6 cm cyst is seen in left hepatic lobe. 8 mm cyst is seen anteriorly in right hepatic lobe. 1.8 cm echogenic focus is seen in left  hepatic lobe most consistent with hemangioma in the absence of any history of primary malignancy.  IMPRESSION: Status post cholecystectomy. No evidence of intrahepatic or extrahepatic biliary dilatation.  Multiple small hepatic cysts are noted. Heterogeneous echotexture of hepatic parenchyma is noted suggesting diffuse hepatocellular disease such as hepatitis.  1.8 mm echogenic focus seen anteriorly in left hepatic lobe ; in the absence of any history of malignancy, this most likely represents benign hemangioma, and followup ultrasound in 6 months would be recommended to ensure stability. If the patient has a history of malignancy, then MRI would be recommended.   Electronically Signed   By: Lupita Raider, M.D.   On: 09/28/2014 11:02    LAB RESULTS: Chem profile on 10/03/2014 revealed alkaline phosphatase 123, albumen 2.6, AST 28, ALT 25, total bili 0.9. CBC on 10/03/2014 revealed WBC 6.6, hemoglobin 11.6, hematocrit 35.5, platelets 308,000.   Physical Exam: BP 100/56 mmHg  Pulse 68  Ht   Wt  Constitutional: Pleasantly confused, elderly, Caucasian female in no acute distress. HEENT: Normocephalic and atraumatic. Conjunctivae are normal. No scleral icterus. Neck supple.  Cardiovascular: Regular rate and rhythm Pulmonary/chest: Effort normal and breath sounds normal.  Abdominal: Soft, nondistended, nontender. Bowel sounds active throughout. There are no masses palpable. No hepatomegaly. Extremities: no edema Lymphadenopathy: No cervical adenopathy noted. Neurological: Alert and oriented to person place and time. Skin: Skin is warm and dry. No rashes noted. Psychiatric: Normal mood and affect. Behavior is normal.  ASSESSMENT AND PLAN: 81 year old female with a history of dementia status post recent admission to [facility] after having fallen and sustaining a left hip fracture, found to have A. fib on that admission, referred for evaluation. Patient apparently was felt to be an  aspiration risk due to her dementia. She has also had days in the hospital where she did not want to eat. Patient was advised to be on a definitive dysphasia 2 (fine chop) diet with thin liquids and to have liquids provided via cup or straw. Recommend patient work with speech pathology at the memory care facility to determine whether she can advanced to a soft or mechanical diet, as well as to assess if the patient needs feeding assistance. Patient may supplement caloric intake with boost or ensure as needed. Patient was also noted to have a  liver cyst on ultrasound which was felt to likely represent a benign hemangioma. A shunt should have a repeat ultrasound in 6 months to assess stability of the size of this cyst, and this can be scheduled by the provider at the memory care facility.    Ellen [name], Moise Boring 10/13/2014, 9:45 PM  CC: Long, Campbell Lerner, MD

## 2014-10-13 NOTE — Patient Instructions (Addendum)
Thank you for choosing [facility] to care for you.  

## 2014-10-18 NOTE — Progress Notes (Signed)
Agree w/ Ms. Hvozdovic's note and mangement. Repeat liver evaluation optional Iva Boop, MD, [facility]

## 2014-10-20 ENCOUNTER — Ambulatory Visit (INDEPENDENT_AMBULATORY_CARE_PROVIDER_SITE_OTHER): Payer: Medicare HMO | Admitting: Physician Assistant

## 2014-10-20 ENCOUNTER — Encounter: Payer: Self-pay | Admitting: Physician Assistant

## 2014-10-20 VITALS — BP 130/54 | HR 126 | Ht 62.0 in | Wt 129.0 lb

## 2014-10-20 DIAGNOSIS — I4891 Unspecified atrial fibrillation: Secondary | ICD-10-CM

## 2014-10-20 DIAGNOSIS — I255 Ischemic cardiomyopathy: Secondary | ICD-10-CM | POA: Diagnosis not present

## 2014-10-20 DIAGNOSIS — I10 Essential (primary) hypertension: Secondary | ICD-10-CM | POA: Diagnosis not present

## 2014-10-20 NOTE — Progress Notes (Signed)
Patient ID: Ellen [name], female   DOB: 1926-08-04, 81 y.o.   MRN: [id number]     Date:  10/20/2014   ID:  [name], DOB August 29, 1926, MRN [id number]  PCP:  Nash Shearer, MD  Primary Cardiologist: Katrinka Blazing  Chief Complaint  Patient presents with  . Follow-up    a little lighted headed at times  . Shortness of Breath    a little     History of Present Illness: Ellen [name] is a 81 y.o. female with a history of CAD s/p CABG, HTN, chronic Afib (diagnosied 04/2014), COPD, dementia, iron deficiency anemia, macular degeneration who admitted 09/25/14 with left hip fracture and afib with RVR.   She was doing well on cardiac stand point of view when last seen by Dr. Katrinka Blazing 05/2014.  She is on ASA  daily.   She was seen in the ER on 7/31, after a unwitnessed fall at a memory care facility. She did not lose consciousness. According to the staff and EMS the patient was back to her baseline and was discharged back to the facility.   She was brought back in by EMS today on 8/7 with complaints of left hip pain for 2 days. No recurrent falls since 7/3, was able to walk. Patient transferred to [facility] for orthopedic consultation for displaced, non-comminuted fracture of L femoral neck s/p left hemiarthroplasty on 8/9.  She was also found to be in afib with RVR on admission. Started on cardizem drip and subsequently changed to by mouth diltiazem.  CHADSVACs score of at least 5 (age, sex, HTN, vascular disease). Not on anticoagulation given age, dementia and high fall risk.  Her last echocardiogram was March 2016 showed ejection fraction of 55-60% with no wall motion abnormalities.  Mild to moderate aortic and mitral regurgitation.  She presents for posthospital follow-up. She was brought over from skilled nursing facility. She reports some shortness of breath but no more than usual. She is a little bit dizzy at times. She been working with PT and OT to improve her strength after her hip fracture.  She sometimes can notice her heart rate beating fast. She otherwise denies nausea, vomiting, fever, chest pain, orthopnea, PND, cough, congestion, abdominal pain, hematochezia, melena, lower extremity edema.  Wt Readings from Last 3 Encounters:  10/20/14 129 lb (58.514 kg)  10/02/14 135 lb 2.3 oz (61.3 kg)  07/07/14 145 lb (65.772 kg)     Past Medical History  Diagnosis Date  . Chronic airway obstruction, not elsewhere classified   . Allergic rhinitis, cause unspecified   . Esophageal reflux   . Iron deficiency anemia, unspecified   . Coronary atherosclerosis of unspecified type of vessel, native or graft   . Dysfunction of eustachian tube   . Obesity, unspecified   . Macular degeneration, right eye Being treated.  . Macular degeneration, left eye Blind     Current Outpatient Prescriptions  Medication Sig Dispense Refill  . ALPRAZolam (XANAX) 0.25 MG tablet Take 1 tablet (0.25 mg total) by mouth at bedtime. 10 tablet 0  . aspirin EC 81 MG EC tablet Take 1 tablet (81 mg total) by mouth daily with breakfast.    . calcium-vitamin D (OSCAL WITH D) 500-200 MG-UNIT per tablet Take 1 tablet by mouth.    . diltiazem (CARDIZEM CD) 360 MG 24 hr capsule Take 1 capsule (360 mg total) by mouth daily.    . furosemide (LASIX) 20 MG tablet Take 1 tablet (20 mg total) by mouth daily. 30 tablet  6  . memantine (NAMENDA XR) 28 MG CP24 24 hr capsule Take 28 mg by mouth daily.    . metoprolol tartrate (LOPRESSOR) 25 MG tablet Take 1 tablet (25 mg total) by mouth 2 (two) times daily. 60 tablet 6  . mirtazapine (REMERON) 7.5 MG tablet Take 7.5 mg by mouth at bedtime.    . Multiple Vitamins-Minerals (EYE VITAMINS) CAPS Take 1 capsule by mouth daily.     [name] NITROSTAT 0.4 MG SL tablet Place 0.4 mg under the tongue every 5 (five) minutes as needed for chest pain. Use as directed as needed    . omeprazole (PRILOSEC) 20 MG capsule Take 20 mg by mouth daily.    [name] oxyCODONE (OXY IR/ROXICODONE) 5 MG immediate  release tablet Take 1 tablet (5 mg total) by mouth every 6 (six) hours as needed for moderate pain. 10 tablet 0  . potassium chloride SA (KLOR-CON M20) 20 MEQ tablet Take 1 tablet (20 mEq total) by mouth daily. 30 tablet 5  . senna-docusate (SENOKOT-S) 8.6-50 MG per tablet Take 1 tablet by mouth at bedtime.    . sertraline (ZOLOFT) 50 MG tablet Take 50 mg by mouth daily.    [name] enoxaparin (LOVENOX) 40 MG/0.4ML injection Inject 0.4 mLs (40 mg total) into the skin daily. For 2 weeks then as per orthopedic surgeon Dr. Ophelia Charter. (Patient not taking: Reported on 10/20/2014) 0 Syringe   . esomeprazole (NEXIUM) 40 MG capsule Take 20 mg by mouth daily.      No current facility-administered medications for this visit.    Allergies:    Allergies  Allergen Reactions  . Doxycycline Other (See Comments)    Destroyed good bacteria in GI tract  . Clarithromycin Other (See Comments)    H/A .[name]..patient unsure of reaction    . Prinivil [Lisinopril] Other (See Comments)    Patient unsure of reaction  . Propulsid [Cisapride] Other (See Comments)    Patient unsure of reaction    Social History:  The patient  reports that she has never smoked. She has never used smokeless tobacco.   Family history:   Family History  Problem Relation Age of Onset  . Colon cancer Brother   . Breast cancer Sister   . Heart disease Father   . Hypertension Father   . Heart disease Mother   . Hypertension Mother     ROS:  Please see the history of present illness.  All other systems reviewed and negative.   PHYSICAL EXAM: VS:  BP 130/54 mmHg  Pulse 126  Ht 5\' 2"  (1.575 m)  Wt 129 lb (58.514 kg)  BMI 23.59 kg/m2 Well nourished, well developed, in no acute distress HEENT: Pupils are equal round react to light accommodation extraocular movements are intact.  Neck: no JVDNo cervical lymphadenopathy. Cardiac: Irregular rate and rhythm without murmurs rubs or gallops.Elevated Lungs:  Decreased breath sounds bilaterally with  mild crackles no wheezing  Abd: soft, nontender, positive bowel sounds all quadrants, no distention Ext: no lower extremity edema.  2+ radial and dorsalis pedis pulses. Skin: warm and dry Neuro:  Grossly normal  EKG:  Atrial fibrillation with a rate of 126 bpm   ASSESSMENT AND PLAN:  Problem List Items Addressed This Visit    Essential hypertension   Relevant Medications   metoprolol tartrate (LOPRESSOR) 25 MG tablet   Cardiomyopathy, ischemic   Relevant Medications   metoprolol tartrate (LOPRESSOR) 25 MG tablet   Atrial fibrillation with RVR - Primary   Relevant Medications  metoprolol tartrate (LOPRESSOR) 25 MG tablet   Other Relevant Orders   EKG 12-Lead      Atrial fibrillation with rapid ventricular response Patient's heart rate is elevated at 126 bpm.  She is currently on Cardizem 360 mg metoprolol 12.5 mg twice daily.  There is no documentation from the nursing facility as to what her heart rate is been last week. Going to go ahead and increase her metoprolol to 25 mg twice daily.  CHADSVACs score of at least 5 (age, sex, HTN, vascular disease). Not on anticoagulation given age, dementia and high fall risk.  I'll have her follow-up in 2 weeks to see how she's doing.  Written instructions back to the nursing facility to monitor blood pressure and heart rate.  Essential hypertension  Blood pressure should tolerate the extra dose of metoprolol.  Ischemic cardiomyopathy She does not appear volume overloaded at this time.

## 2014-10-20 NOTE — Patient Instructions (Addendum)
Increase Metoprolol 25 mg twice a day    Follow Up with Wilburt Finlay PA in 2 weeks   sf

## 2014-11-02 ENCOUNTER — Other Ambulatory Visit: Payer: Medicare HMO

## 2014-11-02 ENCOUNTER — Ambulatory Visit: Payer: Medicare HMO | Admitting: Hematology & Oncology

## 2014-11-02 ENCOUNTER — Ambulatory Visit: Payer: Medicare HMO

## 2014-11-03 ENCOUNTER — Ambulatory Visit (HOSPITAL_BASED_OUTPATIENT_CLINIC_OR_DEPARTMENT_OTHER): Payer: Medicare HMO | Admitting: Hematology & Oncology

## 2014-11-03 ENCOUNTER — Encounter: Payer: Self-pay | Admitting: Hematology & Oncology

## 2014-11-03 ENCOUNTER — Other Ambulatory Visit (HOSPITAL_BASED_OUTPATIENT_CLINIC_OR_DEPARTMENT_OTHER): Payer: Medicare HMO

## 2014-11-03 ENCOUNTER — Ambulatory Visit (HOSPITAL_BASED_OUTPATIENT_CLINIC_OR_DEPARTMENT_OTHER): Payer: Medicare HMO

## 2014-11-03 DIAGNOSIS — F0390 Unspecified dementia without behavioral disturbance: Secondary | ICD-10-CM

## 2014-11-03 DIAGNOSIS — N183 Chronic kidney disease, stage 3 unspecified: Secondary | ICD-10-CM

## 2014-11-03 DIAGNOSIS — N189 Chronic kidney disease, unspecified: Secondary | ICD-10-CM

## 2014-11-03 DIAGNOSIS — D631 Anemia in chronic kidney disease: Secondary | ICD-10-CM

## 2014-11-03 DIAGNOSIS — D509 Iron deficiency anemia, unspecified: Secondary | ICD-10-CM

## 2014-11-03 HISTORY — DX: Anemia in chronic kidney disease: D63.1

## 2014-11-03 HISTORY — DX: Chronic kidney disease, stage 3 unspecified: N18.30

## 2014-11-03 LAB — COMPREHENSIVE METABOLIC PANEL
ALBUMIN: 3.4 g/dL — AB (ref 3.6–5.1)
ALT: 19 U/L (ref 6–29)
AST: 20 U/L (ref 10–35)
Alkaline Phosphatase: 142 U/L — ABNORMAL HIGH (ref 33–130)
BUN: 14 mg/dL (ref 7–25)
CALCIUM: 9 mg/dL (ref 8.6–10.4)
CHLORIDE: 100 mmol/L (ref 98–110)
CO2: 28 mmol/L (ref 20–31)
Creatinine, Ser: 0.92 mg/dL — ABNORMAL HIGH (ref 0.60–0.88)
GLUCOSE: 132 mg/dL — AB (ref 65–99)
Potassium: 4.4 mmol/L (ref 3.5–5.3)
SODIUM: 137 mmol/L (ref 135–146)
Total Bilirubin: 0.5 mg/dL (ref 0.2–1.2)
Total Protein: 6.4 g/dL (ref 6.1–8.1)

## 2014-11-03 LAB — CBC WITH DIFFERENTIAL (CANCER CENTER ONLY)
BASO#: 0.1 10*3/uL (ref 0.0–0.2)
BASO%: 0.5 % (ref 0.0–2.0)
EOS ABS: 0.5 10*3/uL (ref 0.0–0.5)
EOS%: 4.7 % (ref 0.0–7.0)
HEMATOCRIT: 34.7 % — AB (ref 34.8–46.6)
HEMOGLOBIN: 10.7 g/dL — AB (ref 11.6–15.9)
LYMPH#: 1.2 10*3/uL (ref 0.9–3.3)
LYMPH%: 12.8 % — ABNORMAL LOW (ref 14.0–48.0)
MCH: 30 pg (ref 26.0–34.0)
MCHC: 30.8 g/dL — AB (ref 32.0–36.0)
MCV: 97 fL (ref 81–101)
MONO#: 0.6 10*3/uL (ref 0.1–0.9)
MONO%: 6.2 % (ref 0.0–13.0)
NEUT%: 75.8 % (ref 39.6–80.0)
NEUTROS ABS: 7.3 10*3/uL — AB (ref 1.5–6.5)
Platelets: 449 10*3/uL — ABNORMAL HIGH (ref 145–400)
RBC: 3.57 10*6/uL — ABNORMAL LOW (ref 3.70–5.32)
RDW: 13.2 % (ref 11.1–15.7)
WBC: 9.6 10*3/uL (ref 3.9–10.0)

## 2014-11-03 MED ORDER — DARBEPOETIN ALFA 300 MCG/0.6ML IJ SOSY
PREFILLED_SYRINGE | INTRAMUSCULAR | Status: AC
  Filled 2014-11-03: qty 0.6

## 2014-11-03 MED ORDER — DARBEPOETIN ALFA 300 MCG/0.6ML IJ SOSY
300.0000 ug | PREFILLED_SYRINGE | Freq: Once | INTRAMUSCULAR | Status: AC
Start: 2014-11-03 — End: 2014-11-03
  Administered 2014-11-03: 300 ug via SUBCUTANEOUS

## 2014-11-03 NOTE — Progress Notes (Signed)
Hematology and Oncology Follow Up Visit  Ellen [name] [id number] 09-13-1926 81 y.o. 11/03/2014   Principle Diagnosis:  . Anemia of renal insufficiency. 2. Intermittent iron-deficiency anemia. 3. Chronic congestive heart failure.  Current Therapy:   1. Aranesp 300 mcg as needed for hemoglobin less than 11. 2. IV iron as indicated.     Interim History:  Ms.  [name] is back for followup. I have not seen her for a while. She's lost quite a bit overweight. She apparently was hospitalized with a pelvic fracture. She required surgery for this. I did this is back in July.  She is at a assisted living facility. I really don't think that she is a candidate for being at home. She has dementia which certainly is not gotten better.  She does have cardiomyopathy. She has congestive heart failure. I will think this is gotten any worse.  Last time she iron was back in May of this year.  Overall, her performance status was ECOG 3  Medications:  Current outpatient prescriptions:  .  ALPRAZolam (XANAX) 0.25 MG tablet, Take 1 tablet (0.25 mg total) by mouth at bedtime., Disp: 10 tablet, Rfl: 0 .  aspirin EC 81 MG EC tablet, Take 1 tablet (81 mg total) by mouth daily with breakfast., Disp: , Rfl:  .  diltiazem (CARDIZEM CD) 360 MG 24 hr capsule, Take 1 capsule (360 mg total) by mouth daily., Disp: , Rfl:  .  furosemide (LASIX) 20 MG tablet, Take 1 tablet (20 mg total) by mouth daily., Disp: 30 tablet, Rfl: 6 .  memantine (NAMENDA XR) 28 MG CP24 24 hr capsule, Take 28 mg by mouth daily., Disp: , Rfl:  .  metoprolol tartrate (LOPRESSOR) 25 MG tablet, Take 1 tablet (25 mg total) by mouth 2 (two) times daily., Disp: 60 tablet, Rfl: 6 .  mirtazapine (REMERON) 7.5 MG tablet, Take 7.5 mg by mouth at bedtime., Disp: , Rfl:  .  Multiple Vitamins-Minerals (EYE VITAMINS) CAPS, Take 1 capsule by mouth daily. , Disp: , Rfl:  .  NITROSTAT 0.4 MG SL tablet, Place 0.4 mg under the tongue every 5 (five) minutes as  needed for chest pain. Use as directed as needed, Disp: , Rfl:  .  omeprazole (PRILOSEC) 20 MG capsule, Take 20 mg by mouth daily., Disp: , Rfl:  .  oxyCODONE (OXY IR/ROXICODONE) 5 MG immediate release tablet, Take 1 tablet (5 mg total) by mouth every 6 (six) hours as needed for moderate pain., Disp: 10 tablet, Rfl: 0 .  potassium chloride SA (KLOR-CON M20) 20 MEQ tablet, Take 1 tablet (20 mEq total) by mouth daily., Disp: 30 tablet, Rfl: 5 .  senna-docusate (SENOKOT-S) 8.6-50 MG per tablet, Take 1 tablet by mouth at bedtime., Disp: , Rfl:  .  sertraline (ZOLOFT) 50 MG tablet, Take 50 mg by mouth daily., Disp: , Rfl:  .  calcium-vitamin D (OSCAL WITH D) 500-200 MG-UNIT per tablet, Take 1 tablet by mouth., Disp: , Rfl:  .  enoxaparin (LOVENOX) 40 MG/0.4ML injection, Inject 0.4 mLs (40 mg total) into the skin daily. For 2 weeks then as per orthopedic surgeon Dr. Ophelia Charter. (Patient not taking: Reported on 10/20/2014), Disp: 0 Syringe, Rfl:  .  esomeprazole (NEXIUM) 40 MG capsule, Take 20 mg by mouth daily. , Disp: , Rfl:   Allergies:  Allergies  Allergen Reactions  . Doxycycline Other (See Comments)    Destroyed good bacteria in GI tract  . Clarithromycin Other (See Comments)    H/A .[name]..patient unsure of  reaction    . Prinivil [Lisinopril] Other (See Comments)    Patient unsure of reaction  . Propulsid [Cisapride] Other (See Comments)    Patient unsure of reaction    Past Medical History, Surgical history, Social history, and Family History were reviewed and updated.  Review of Systems: As above  Physical Exam:  oxygen saturation is 98%.   Elderly white female in no obvious distress. Head and neck exam shows no ocular or oral lesions. She has no adenopathy in the neck. Lungs are with decreased breath sounds at the bases. Cardiac exam regular rate and rhythm with an occasional extra beat. She has a 1/6 systolic murmur. Abdomen is soft. She has no fluid wave. There is no palpable liver or spleen  tip. Back exam no tenderness over the spine, ribs or hips. Extremities shows mild chronic nonpitting edema of the lower legs. Skin exam shows no rashes, ecchymoses or petechia. Neurological exam is nonfocal.  Lab Results  Component Value Date   WBC 9.6 11/03/2014   HGB 10.7* 11/03/2014   HCT 34.7* 11/03/2014   MCV 97 11/03/2014   PLT 449* 11/03/2014     Chemistry      Component Value Date/Time   NA 136 10/03/2014 0549   K 3.6 10/03/2014 0549   CL 91* 10/03/2014 0549   CO2 36* 10/03/2014 0549   BUN <5* 10/03/2014 0549   CREATININE 0.50 10/03/2014 0549      Component Value Date/Time   CALCIUM 8.7* 10/03/2014 0549   ALKPHOS 123 10/03/2014 0549   AST 28 10/03/2014 0549   ALT 25 10/03/2014 0549   BILITOT 0.9 10/03/2014 0549         Impression and Plan: Ellen [name] is an 81 year old white female with multi-factorial anemia.  It has been a while since I have seen her. She has lost quite a bit overweight. The pelvic fracture certainly has not helped.  Her dementia also is a problem.  I think that she is alert but more anemic because of the fracture and surgery. I will go ahead and give her some Aranesp.  I want to see her back in 2 months.  Ellen Macho, MD 9/15/20164:38 PM

## 2014-11-03 NOTE — Patient Instructions (Signed)
Darbepoetin Alfa injection What is this medicine? DARBEPOETIN ALFA (dar be POE e tin AL fa) helps your body make more red blood cells. It is used to treat anemia caused by chronic kidney failure and chemotherapy. This medicine may be used for other purposes; ask your health care provider or pharmacist if you have questions. COMMON BRAND NAME(S): Aranesp What should I tell my health care provider before I take this medicine? They need to know if you have any of these conditions: -blood clotting disorders or history of blood clots -cancer patient not on chemotherapy -cystic fibrosis -heart disease, such as angina, heart failure, or a history of a heart attack -hemoglobin level of 12 g/dL or greater -high blood pressure -low levels of folate, iron, or vitamin B12 -seizures -an unusual or allergic reaction to darbepoetin, erythropoietin, albumin, hamster proteins, latex, other medicines, foods, dyes, or preservatives -pregnant or trying to get pregnant -breast-feeding How should I use this medicine? This medicine is for injection into a vein or under the skin. It is usually given by a health care professional in a hospital or clinic setting. If you get this medicine at home, you will be taught how to prepare and give this medicine. Do not shake the solution before you withdraw a dose. Use exactly as directed. Take your medicine at regular intervals. Do not take your medicine more often than directed. It is important that you put your used needles and syringes in a special sharps container. Do not put them in a trash can. If you do not have a sharps container, call your pharmacist or healthcare provider to get one. Talk to your pediatrician regarding the use of this medicine in children. While this medicine may be used in children as young as 1 year for selected conditions, precautions do apply. Overdosage: If you think you have taken too much of this medicine contact a poison control center or  emergency room at once. NOTE: This medicine is only for you. Do not share this medicine with others. What if I miss a dose? If you miss a dose, take it as soon as you can. If it is almost time for your next dose, take only that dose. Do not take double or extra doses. What may interact with this medicine? Do not take this medicine with any of the following medications: -epoetin alfa This list may not describe all possible interactions. Give your health care provider a list of all the medicines, herbs, non-prescription drugs, or dietary supplements you use. Also tell them if you smoke, drink alcohol, or use illegal drugs. Some items may interact with your medicine. What should I watch for while using this medicine? Visit your prescriber or health care professional for regular checks on your progress and for the needed blood tests and blood pressure measurements. It is especially important for the doctor to make sure your hemoglobin level is in the desired range, to limit the risk of potential side effects and to give you the best benefit. Keep all appointments for any recommended tests. Check your blood pressure as directed. Ask your doctor what your blood pressure should be and when you should contact him or her. As your body makes more red blood cells, you may need to take iron, folic acid, or vitamin B supplements. Ask your doctor or health care provider which products are right for you. If you have kidney disease continue dietary restrictions, even though this medication can make you feel better. Talk with your doctor or health   care professional about the foods you eat and the vitamins that you take. What side effects may I notice from receiving this medicine? Side effects that you should report to your doctor or health care professional as soon as possible: -allergic reactions like skin rash, itching or hives, swelling of the face, lips, or tongue -breathing problems -changes in vision -chest  pain -confusion, trouble speaking or understanding -feeling faint or lightheaded, falls -high blood pressure -muscle aches or pains -pain, swelling, warmth in the leg -rapid weight gain -severe headaches -sudden numbness or weakness of the face, arm or leg -trouble walking, dizziness, loss of balance or coordination -seizures (convulsions) -swelling of the ankles, feet, hands -unusually weak or tired Side effects that usually do not require medical attention (report to your doctor or health care professional if they continue or are bothersome): -diarrhea -fever, chills (flu-like symptoms) -headaches -nausea, vomiting -redness, stinging, or swelling at site where injected This list may not describe all possible side effects. Call your doctor for medical advice about side effects. You may report side effects to FDA at 1-800-FDA-1088. Where should I keep my medicine? Keep out of the reach of children. Store in a refrigerator between 2 and 8 degrees C (36 and 46 degrees F). Do not freeze. Do not shake. Throw away any unused portion if using a single-dose vial. Throw away any unused medicine after the expiration date. NOTE: This sheet is a summary. It may not cover all possible information. If you have questions about this medicine, talk to your doctor, pharmacist, or health care provider.  2015, [employer]. (2008-01-19 10:23:57)  

## 2014-11-04 LAB — IRON AND TIBC CHCC
%SAT: 25 % (ref 21–57)
IRON: 51 ug/dL (ref 41–142)
TIBC: 203 ug/dL — AB (ref 236–444)
UIBC: 151 ug/dL (ref 120–384)

## 2014-11-04 LAB — FERRITIN CHCC

## 2014-11-07 ENCOUNTER — Telehealth: Payer: Self-pay | Admitting: Hematology & Oncology

## 2014-11-07 ENCOUNTER — Encounter: Payer: Self-pay | Admitting: Cardiology

## 2014-11-07 ENCOUNTER — Ambulatory Visit (INDEPENDENT_AMBULATORY_CARE_PROVIDER_SITE_OTHER): Payer: Medicare HMO | Admitting: Cardiology

## 2014-11-07 VITALS — BP 92/58 | HR 99 | Ht 62.0 in | Wt 129.0 lb

## 2014-11-07 DIAGNOSIS — I10 Essential (primary) hypertension: Secondary | ICD-10-CM

## 2014-11-07 NOTE — Patient Instructions (Signed)
Medication Instructions:   TAKE lasix once daily as needed for weight gain >3lbs in 24 hours, swelling/fluid retention, shortness of breath   >> take potassium only when taking lasix    Labwork:  NONE  Testing/Procedures:  NONE  Follow-Up:  6 months with Dr. Katrinka Blazing  Any Other Special Instructions Will Be Listed Below (If Applicable).  Weigh daily - about the same time each day in about the same amount of clothes.

## 2014-11-07 NOTE — Telephone Encounter (Signed)
Drucie Opitz Nbr: [id number] Status: Approved Dates: 02/16/2014 - 01/25/2015 [id number] ARANESP      COPY SCANNED

## 2014-11-07 NOTE — Progress Notes (Signed)
11/07/2014 Ellen [name]   March 31, 1926  [id number]  Primary Physician Nash Shearer, MD Primary Cardiologist: Dr. Katrinka Blazing   Reason for Visit/CC: F/u for Atrial Fibrillation  HPI:   The patient is a 81 year old female, followed by Dr. Katrinka Blazing, with a history of CAD status post CABG, hypertension, chronic atrial fibrillation (diagnosis March 2016),  Oxygen-dependent COPD, dementia, iron deficiency anemia and macular degeneration. Due to her advanced age and fall risk, she is not a candidate for oral anticoagulation.   She was recently admitted to [facility] 09/25/2014 with a left hip fracture. At the time, she was noted be in atrial for ablation with RVR. Rate control was achieved with Cardizem and Metroprolol.  She was discharged to an assisting living facility.   She recently presented back to clinic for post hospital follow-up and was evaluated by Huey Bienenstock, PA-C,. EKG at that time continued to show atrial fibrillation with a rapid ventricular response of 126 bpm. Subsequently, Mr. Leron Croak increased her Metroprolol from 12.5 mg to 25 mg twice a day.  360 mg of PO Cardizem was continued.    She now presents back to clinic for follow-up. EKG  continues to show atrial fibrillation however her ventricular response is improved. Current rate is 99 bpm. She is fairly asymptomatic, denying any palpitations, chest discomfort, dyspnea , dizziness, syncope/near-syncope. Although her heart rate is better controlled, her blood pressure is a bit on the low side. Systolic pressure is 90 mmHg.  Her daughter notes that her blood pressure has been on the lower side recently with occasional drops in the upper 80s.  In addition to Metoprolol and Cardizem, she is also on low-dose Lasix.  Note, she recently had a 2-D echocardiogram in March of this year which showed normal left ventricular systolic function with an ejection fraction of 55-60%. She denies any issues with weight gain or fluid retension.     Current  Outpatient Prescriptions  Medication Sig Dispense Refill  . ALPRAZolam (XANAX) 0.25 MG tablet Take 1 tablet (0.25 mg total) by mouth at bedtime. 10 tablet 0  . aspirin EC 81 MG EC tablet Take 1 tablet (81 mg total) by mouth daily with breakfast.    . calcium-vitamin D (OSCAL WITH D) 500-200 MG-UNIT per tablet Take 1 tablet by mouth.    . diltiazem (CARDIZEM CD) 360 MG 24 hr capsule Take 1 capsule (360 mg total) by mouth daily.    [name] enoxaparin (LOVENOX) 40 MG/0.4ML injection Inject 0.4 mLs (40 mg total) into the skin daily. For 2 weeks then as per orthopedic surgeon Dr. Ophelia Charter. 0 Syringe   . furosemide (LASIX) 20 MG tablet Take 1 tablet (20 mg total) by mouth daily. 30 tablet 6  . memantine (NAMENDA XR) 28 MG CP24 24 hr capsule Take 28 mg by mouth daily.    . metoprolol tartrate (LOPRESSOR) 25 MG tablet Take 1 tablet (25 mg total) by mouth 2 (two) times daily. 60 tablet 6  . Multiple Vitamins-Minerals (EYE VITAMINS) CAPS Take 1 capsule by mouth daily.     [name] NITROSTAT 0.4 MG SL tablet Place 0.4 mg under the tongue every 5 (five) minutes as needed for chest pain. Use as directed as needed    . omeprazole (PRILOSEC) 20 MG capsule Take 20 mg by mouth daily.    [name] oxyCODONE (OXY IR/ROXICODONE) 5 MG immediate release tablet Take 1 tablet (5 mg total) by mouth every 6 (six) hours as needed for moderate pain. 10 tablet 0  . potassium chloride  SA (KLOR-CON M20) 20 MEQ tablet Take 1 tablet (20 mEq total) by mouth daily. 30 tablet 5  . senna-docusate (SENOKOT-S) 8.6-50 MG per tablet Take 1 tablet by mouth at bedtime.    . sertraline (ZOLOFT) 50 MG tablet Take 50 mg by mouth daily.     No current facility-administered medications for this visit.    Allergies  Allergen Reactions  . Doxycycline Other (See Comments)    Destroyed good bacteria in GI tract  . Clarithromycin Other (See Comments)    H/A .[name]..patient unsure of reaction    . Prinivil [Lisinopril] Other (See Comments)    Patient unsure of reaction   . Propulsid [Cisapride] Other (See Comments)    Patient unsure of reaction    Social History   Social History  . Marital Status: Widowed    Spouse Name: N/A  . Number of Children: N/A  . Years of Education: N/A   Occupational History  . Retired    Social History Main Topics  . Smoking status: Never Smoker   . Smokeless tobacco: Never Used     Comment: Never used tobacco  . Alcohol Use: Not on file  . Drug Use: Not on file  . Sexual Activity: Not on file   Other Topics Concern  . Not on file   Social History Narrative     Review of Systems: General: negative for chills, fever, night sweats or weight changes.  Cardiovascular: negative for chest pain, dyspnea on exertion, edema, orthopnea, palpitations, paroxysmal nocturnal dyspnea or shortness of breath Dermatological: negative for rash Respiratory: negative for cough or wheezing Urologic: negative for hematuria Abdominal: negative for nausea, vomiting, diarrhea, bright red blood per rectum, melena, or hematemesis Neurologic: negative for visual changes, syncope, or dizziness All other systems reviewed and are otherwise negative except as noted above.    Blood pressure 92/58, pulse 99, height  (1.575 m), weight 129 lb (58.514 kg).  General appearance: alert, cooperative and no distress Neck: no carotid bruit and no JVD Lungs: clear to auscultation bilaterally Heart: irregularly irregular rhythm and regular rate Extremities: no LEE Pulses: 2+ and symmetric Skin: warm and dry Neurologic: Grossly normal  EKG atrial fibrillation. 99 bpm   ASSESSMENT AND PLAN:    1.   Chronic atrial fibrillation:  Ventricular rate is better controlled after increase in Metroprolol to 25 mg twice a day.  Is also on 360 mg of Cardizem daily. Would continue Metroprolol and Cardizem for rate control. She is not a candidate for anticoagulation given her advanced age , dementia and fall risk.   2. Borderline hypotension:  Patient's  daughter notes that systolic blood pressure has been borderline low at the skilled nursing facility. Her systolic blood pressure in clinic today is 92 bpm. She is on fairly high doses of Cardizem and Metroprolol , needed for her atrial fibrillation. She also appears to be on Lasix daily. However , recent 2-D echocardiogram revealed normal left ventricular systolic function. She has no signs of fluid retention on exam today. Would recommend changing Lasix to when necessary based on weight and fluid retention. This will hopefully allow for more room in her blood pressure so the we can continue her on her current doses of rate control agents needed for her atrial fibrillation.  3. CAD: h/o CABG. Denies any recent angina or dyspnea.    PLAN  F/u with Dr. Katrinka Blazing in 6 months, or sooner if needed.   Robbie Lis PA-C 11/07/2014 11:17 AM

## 2014-12-01 DIAGNOSIS — Z79899 Other long term (current) drug therapy: Secondary | ICD-10-CM | POA: Diagnosis not present

## 2014-12-15 DIAGNOSIS — R69 Illness, unspecified: Secondary | ICD-10-CM | POA: Diagnosis not present

## 2014-12-15 DIAGNOSIS — Z961 Presence of intraocular lens: Secondary | ICD-10-CM | POA: Diagnosis not present

## 2014-12-15 DIAGNOSIS — G301 Alzheimer's disease with late onset: Secondary | ICD-10-CM | POA: Diagnosis not present

## 2014-12-15 DIAGNOSIS — H43813 Vitreous degeneration, bilateral: Secondary | ICD-10-CM | POA: Diagnosis not present

## 2014-12-15 DIAGNOSIS — H353233 Exudative age-related macular degeneration, bilateral, with inactive scar: Secondary | ICD-10-CM | POA: Diagnosis not present

## 2014-12-19 DIAGNOSIS — K219 Gastro-esophageal reflux disease without esophagitis: Secondary | ICD-10-CM | POA: Diagnosis not present

## 2014-12-19 DIAGNOSIS — Z5189 Encounter for other specified aftercare: Secondary | ICD-10-CM | POA: Diagnosis not present

## 2014-12-19 DIAGNOSIS — I2510 Atherosclerotic heart disease of native coronary artery without angina pectoris: Secondary | ICD-10-CM | POA: Diagnosis not present

## 2014-12-19 DIAGNOSIS — J439 Emphysema, unspecified: Secondary | ICD-10-CM | POA: Diagnosis not present

## 2014-12-19 DIAGNOSIS — I482 Chronic atrial fibrillation: Secondary | ICD-10-CM | POA: Diagnosis not present

## 2014-12-19 DIAGNOSIS — I10 Essential (primary) hypertension: Secondary | ICD-10-CM | POA: Diagnosis not present

## 2014-12-19 DIAGNOSIS — G309 Alzheimer's disease, unspecified: Secondary | ICD-10-CM | POA: Diagnosis not present

## 2014-12-19 DIAGNOSIS — Z4789 Encounter for other orthopedic aftercare: Secondary | ICD-10-CM | POA: Diagnosis not present

## 2014-12-27 DIAGNOSIS — M25519 Pain in unspecified shoulder: Secondary | ICD-10-CM | POA: Diagnosis not present

## 2015-01-05 ENCOUNTER — Ambulatory Visit (INDEPENDENT_AMBULATORY_CARE_PROVIDER_SITE_OTHER): Payer: Medicare HMO | Admitting: Internal Medicine

## 2015-01-05 ENCOUNTER — Encounter: Payer: Self-pay | Admitting: Internal Medicine

## 2015-01-05 VITALS — BP 110/64 | HR 70

## 2015-01-05 DIAGNOSIS — J9611 Chronic respiratory failure with hypoxia: Secondary | ICD-10-CM

## 2015-01-05 DIAGNOSIS — J449 Chronic obstructive pulmonary disease, unspecified: Secondary | ICD-10-CM

## 2015-01-05 DIAGNOSIS — I4891 Unspecified atrial fibrillation: Secondary | ICD-10-CM | POA: Diagnosis not present

## 2015-01-05 NOTE — Patient Instructions (Signed)
I think the physician at the Center can keep oxygen renewed.  I will be happy to see as needed

## 2015-01-05 NOTE — Progress Notes (Signed)
Patient ID: Jefm PettyBarbara [name], female    DOB: [birth year], 81 y.o.   MRN: [id number]  HPI 07/26/10- 81 yo never smoker, followed for COPD, allergic rhinitis, complicated by CAD, renal insufficiency, anemia  and GERD Last here February 26, 2010- did have a bronchitis and an episode of gastroenteritis with dehydration, but those have resolved. Felt that pollen bothered her this Spring. Yesterday was more short of breath. Had gone to see Dr Smith/cardiology- she hurried to get there and lost her breath for awhile. He told her she was retaining some fluid. Hgb recently 10- given injection by Dr Myna HidalgoEnnever. Wears oxygen for sleep at 2 L/M. We reviewed her 6MWT done last year. She had portable O2 in the past but couldn't see well enough for it to be worthwhile and turned it in.  PFT 03/05/10- moderate obstruction FEV1/ FVC 0.65, insignif resp to BD. Unable to perform LV or DLCO.  04/23/11- 81 yo never smoker, followed for COPD, allergic rhinitis, complicated by CAD, renal insufficiency, anemia  and GERD Daughter is here. Not breathing well. [facility] provided light portable pulse oxygen. Some mild cough with occasional scant, thick, white sputum. Had chest pain last week and evaluated by Dr. Smith/cardiology and she understands he felt she was okay. Noticing some confusion, loss of memory. Her primary physician asked her to use oxygen more consistently. Her hematologist is watching chronic anemia. Can't afford Spiriva.  08/23/11- 81 yo never smoker, followed for COPD, allergic rhinitis, complicated by CAD, renal insufficiency, anemia  and GERD Not breathing well today-been more active; having increased SOB Increased shortness of breath for several weeks. No distinct infection but she can't rule out a mild cold at the beginning. Loose cough and hacking. She took some Lasix recently put her feet started swelling.  12/23/11-84 yo never smoker, followed for COPD, allergic rhinitis, complicated by CAD, renal  insufficiency, anemia  and GERD FOLLOWS FOR:SOB with rushed activity or increased anxiety/stress     friend here COPD assessment test (CAT) score 34/40 Cough is productive of clear thick sputum. She continues oxygen at 2 L/[facility]. Sleeps upright in chair but planning to get a hospital bed CXR 08/30/11-reviewed IMPRESSION:  Stable exam. Chronic elevation of the left hemidiaphragm with left  basilar scarring and / or atelectasis. Chronic bronchitic changes.  Original Report Authenticated By: Andreas NewportGEOFFREY LAMKE, M.D.   06/23/12- -81 yo never smoker, followed for COPD, allergic rhinitis, complicated by CAD, renal insufficiency, anemia  and GERD  Daughter here FOLLOWS FOR: increased SOB and wheezing more than usual; daughter states patient had bronchitis couple months ago and had hard time getting over it; Pt states she has noticed more weakness and dizziness. Weak and dizzy for 3 days when she ran out of blood pressure pills. Bronchitis in March was treated by her primary physician. Continues oxygen 2 L/[facility]  12/22/12- 81 yo never smoker, followed for COPD, allergic rhinitis, complicated by CAD, renal insufficiency, anemia  and GERD  Daughter here FOLLOWS FOR: recently felt like she had a "cold" x 1 week; PND, and unable to breath. Residual stuffy nose. CXR 06/23/12 IMPRESSION:  No edema or consolidation. Stable elevation of the left  hemidiaphragm. Heart mildly enlarged but stable.   Original Report Authenticated By: Bretta BangWilliam Woodruff, M.D.  06/22/13-87 yo never smoker, followed for COPD, allergic rhinitis, elevated left diaphragm, complicated by CAD, renal insufficiency, anemia  and GERD  Daughter here FOLLOWS FOR: Increased SOB with activity-resides in rest home now. Desat to 88% on arrival  on room walking, 92% at rest. No acute events since last here. Occasional weak spells, cough, chest pains and dizziness but no wheeze.  12/27/13- 81 yo never smoker, followed for COPD/ chronic bronchitis with  mucous plugs, allergic rhinitis, elevated left diaphragm, complicated by CAD, renal insufficiency, anemia and GERD  Daughter here   O2 2l/ [facility] SOB "comes and goes."  Coughing occas - mostly nonprod.  Relates dyspnea to weather changes. CXR 09/06/13 IMPRESSION: 1. Radiographic appearance of the chest is unchanged, with persistent multifocal reticulonodular opacities throughout the lungs bilaterally, compatible with areas of mucoid impaction within dilated bronchi and bronchioles noted on recent chest CT. Electronically Signed  By: Trudie Reedaniel Entrikin M.D.  On: 09/06/2013 16:01  07/04/14- 81 yo never smoker, followed for COPD/ chronic bronchitis with mucous plugs, allergic rhinitis, elevated left diaphragm, complicated by CAD/ AFib, renal insufficiency, anemia and GERD  Daughter here   O2 2l/ [facility] FOLLOWS FOR: Pt states she has to pace herself; gives out quickly. [facility] Blamed increased cough on spring pollen. Stable exertional dyspnea. Not much cough now and she feels at baseline. CXR 05/01/14 IMPRESSION: Persistently elevated LEFT hemidiaphragm with new LEFT lower lobe airspace opacity which may reflect atelectasis or pneumonia. Increasing linear densities extend at LEFT hilum, stable RIGHT midlung zone reticular nodular densities previously attributed to mucoid impaction. Stable cardiomegaly. Electronically Signed  By: Awilda Metroourtnay Bloomer  On: 05/01/2014 23:08  01/05/15- 81 year old female never smoker followed for COPD/chronic bronchitis/chronic hypoxic respiratory failure with mucous plugs, allergic rhinitis, elevated left diaphragm, complicated by CAD/CABG atrial fibrillation, renal insufficiency, anemia, GERD O2 2L/[facility]       daughter here FOLLOWS FOR: Pt now resides at [facility] for hip fracture; Pt continues to use O2 during day and night. Hip fracture, now SNF for rehabilitation Atrial fib rate has been controlled. Daughter reports  no cough or wheeze. Has lost some weight. CXR 09/28/2014 FINDINGS: 0835 hours. Low volume film. No overt airspace pulmonary edema or focal lung consolidation. There is atelectasis at the left lung base. Interstitial markings are diffusely coarsened with chronic features. The cardio pericardial silhouette is enlarged. Patient is status post CABG. Telemetry leads overlie the chest. Bones are diffusely demineralized. IMPRESSION: Low volume film without acute cardiopulmonary findings. Electronically Signed  By: Kennith CenterEric Mansell M.D.  On: 09/28/2014 08:43  ROS-see HPI Constitutional:  +  weight loss, night sweats, fevers, chills, fatigue, lassitude, + weakness HEENT:   No-  headaches, difficulty swallowing, tooth/dental problems, sore throat,       No-  sneezing, itching, ear ache, no-nasal congestion, post nasal drip,  CV:   No-chest pain, orthopnea, PND, swelling in lower extremities, +dizziness, no-palpitations Resp: +  shortness of breath with exertion or at rest.              No- productive cough,   non-productive cough,  No- coughing up of blood.              No-   change in color of mucus.  No- wheezing.   Skin: No-   rash or lesions. GI:  No-   heartburn, indigestion, abdominal pain, nausea, vomiting GU:  MS:  No-   joint pain or swelling.   Neuro-     nothing unusual Psych:  No- change in mood or affect. No depression or anxiety.  No memory loss.  OBJ- Physical Exam  General- Alert, Oriented, Affect-appropriate, Distress- none acute, O2 sat 98% on 2 L pulsed. Skin- rash-none, lesions- none,  excoriation- none Lymphadenopathy- none Head- atraumatic            Eyes- +indicates vision diminished, PERRLA, conjunctivae and secretions clear            Ears- Hearing, canals-normal            Nose- Clear, no-Septal dev, mucus, polyps, erosion, perforation             Throat- Mallampati II , mucosa clear , drainage- none, tonsils- atrophic Neck- flexible , trachea midline, no  stridor , thyroid nl, carotid no bruit Chest - symmetrical excursion , unlabored           Heart/CV- + IRR/ AFib , no murmur , no gallop  , no rub, nl s1 s2                           , edema- none, stasis changes- none, varices- none           Lung- + coarse breath sounds, wheeze- none, cough- none , dullness+left base, rub- none           Chest wall-  Abd-  Br/ Gen/ Rectal- Not done, not indicated Extrem- wheelchair + Neuro- grossly intact to observation, calm, + daughter does all the talking for her

## 2015-01-06 ENCOUNTER — Other Ambulatory Visit: Payer: Self-pay | Admitting: Nurse Practitioner

## 2015-01-06 ENCOUNTER — Ambulatory Visit (HOSPITAL_BASED_OUTPATIENT_CLINIC_OR_DEPARTMENT_OTHER): Payer: Medicare HMO | Admitting: Hematology & Oncology

## 2015-01-06 ENCOUNTER — Ambulatory Visit: Payer: Medicare HMO

## 2015-01-06 ENCOUNTER — Encounter: Payer: Self-pay | Admitting: Hematology & Oncology

## 2015-01-06 ENCOUNTER — Other Ambulatory Visit (HOSPITAL_BASED_OUTPATIENT_CLINIC_OR_DEPARTMENT_OTHER): Payer: Medicare HMO

## 2015-01-06 VITALS — BP 118/66 | HR 80 | Temp 98.0°F | Resp 16 | Ht 62.0 in

## 2015-01-06 DIAGNOSIS — N183 Chronic kidney disease, stage 3 unspecified: Secondary | ICD-10-CM

## 2015-01-06 DIAGNOSIS — D509 Iron deficiency anemia, unspecified: Secondary | ICD-10-CM

## 2015-01-06 DIAGNOSIS — D631 Anemia in chronic kidney disease: Secondary | ICD-10-CM

## 2015-01-06 DIAGNOSIS — D644 Congenital dyserythropoietic anemia: Secondary | ICD-10-CM | POA: Diagnosis not present

## 2015-01-06 LAB — CBC WITH DIFFERENTIAL (CANCER CENTER ONLY)
BASO#: 0 10*3/uL (ref 0.0–0.2)
BASO%: 0.7 % (ref 0.0–2.0)
EOS ABS: 0.4 10*3/uL (ref 0.0–0.5)
EOS%: 6.5 % (ref 0.0–7.0)
HCT: 38.1 % (ref 34.8–46.6)
HGB: 11.3 g/dL — ABNORMAL LOW (ref 11.6–15.9)
LYMPH#: 1.3 10*3/uL (ref 0.9–3.3)
LYMPH%: 23.8 % (ref 14.0–48.0)
MCH: 28.1 pg (ref 26.0–34.0)
MCHC: 29.7 g/dL — AB (ref 32.0–36.0)
MCV: 95 fL (ref 81–101)
MONO#: 0.5 10*3/uL (ref 0.1–0.9)
MONO%: 9.4 % (ref 0.0–13.0)
NEUT#: 3.3 10*3/uL (ref 1.5–6.5)
NEUT%: 59.6 % (ref 39.6–80.0)
PLATELETS: 305 10*3/uL (ref 145–400)
RBC: 4.02 10*6/uL (ref 3.70–5.32)
RDW: 14.7 % (ref 11.1–15.7)
WBC: 5.6 10*3/uL (ref 3.9–10.0)

## 2015-01-06 LAB — COMPREHENSIVE METABOLIC PANEL WITH GFR
ALT: 17 U/L (ref 6–29)
AST: 24 U/L (ref 10–35)
Albumin: 3.3 g/dL — ABNORMAL LOW (ref 3.6–5.1)
Alkaline Phosphatase: 122 U/L (ref 33–130)
BUN: 11 mg/dL (ref 7–25)
CO2: 39 mmol/L — ABNORMAL HIGH (ref 20–31)
Calcium: 8.3 mg/dL — ABNORMAL LOW (ref 8.6–10.4)
Chloride: 98 mmol/L (ref 98–110)
Creatinine, Ser: 0.72 mg/dL (ref 0.60–0.88)
Glucose, Bld: 110 mg/dL — ABNORMAL HIGH (ref 65–99)
Potassium: 4 mmol/L (ref 3.5–5.3)
Sodium: 139 mmol/L (ref 135–146)
Total Bilirubin: 0.4 mg/dL (ref 0.2–1.2)
Total Protein: 5.9 g/dL — ABNORMAL LOW (ref 6.1–8.1)

## 2015-01-06 NOTE — Progress Notes (Signed)
Hematology and Oncology Follow Up Visit  Ellen PettyBarbara [name] [id number] [birth year] 81 y.o. 01/06/2015   Principle Diagnosis:  . Anemia of renal insufficiency. 2. Intermittent iron-deficiency anemia. 3. Chronic congestive heart failure.  Current Therapy:   1. Aranesp 300 mcg as needed for hemoglobin less than 11. 2. IV iron as indicated.     Interim History:  Ms.  Ellen [name] is back for followup. She seems to be doing pretty well. Her daughter says that she tries to stand up when she really should not and has had some falling episodes.  She's had no obvious bleeding.  There's been no issues with heart failure.  Her appetite might be a little bit better. She's had no nausea or vomiting.  She has had some episodes of incontinence but this likely is from her neck just not be able to make it to the bathroom. She does have the memory issues. She is on Namenda for this.  Last time we checked her iron studies, her ferritin was 2387 and her iron saturation was 25%. As such, I think her ferritin is mostly an acute phase reactant.  She's had no fever. She's had no problems with infections.  Last time we gave her iron was back in May of this year.   Overall, her performance status was ECOG 3  Medications:  Current outpatient prescriptions:  .  ALPRAZolam (XANAX) 0.25 MG tablet, Take 1 tablet (0.25 mg total) by mouth at bedtime., Disp: 10 tablet, Rfl: 0 .  aspirin EC 81 MG EC tablet, Take 1 tablet (81 mg total) by mouth daily with breakfast., Disp: , Rfl:  .  calcium-vitamin D (OSCAL WITH D) 500-200 MG-UNIT per tablet, Take 1 tablet by mouth., Disp: , Rfl:  .  diltiazem (CARDIZEM CD) 360 MG 24 hr capsule, Take 1 capsule (360 mg total) by mouth daily., Disp: , Rfl:  .  famotidine (PEPCID) 20 MG tablet, Take 20 mg by mouth daily., Disp: , Rfl:  .  furosemide (LASIX) 20 MG tablet, Take 20 mg by mouth daily as needed (take if gains 3lbs in 1 day, short of breath, swelling.)., Disp: , Rfl:  .  memantine  (NAMENDA XR) 28 MG CP24 24 hr capsule, Take 28 mg by mouth daily., Disp: , Rfl:  .  metoprolol tartrate (LOPRESSOR) 25 MG tablet, Take 1 tablet (25 mg total) by mouth 2 (two) times daily., Disp: 60 tablet, Rfl: 6 .  mirtazapine (REMERON) 7.5 MG tablet, Take 7.5 mg by mouth at bedtime., Disp: , Rfl:  .  Multiple Vitamin (MULTIVITAMIN) tablet, Take 1 tablet by mouth daily., Disp: , Rfl:  .  Multiple Vitamins-Minerals (EYE VITAMINS) CAPS, Take 1 capsule by mouth daily. , Disp: , Rfl:  .  NITROSTAT 0.4 MG SL tablet, Place 0.4 mg under the tongue every 5 (five) minutes as needed for chest pain. Use as directed as needed, Disp: , Rfl:  .  oxyCODONE (OXY IR/ROXICODONE) 5 MG immediate release tablet, Take 1 tablet (5 mg total) by mouth every 6 (six) hours as needed for moderate pain., Disp: 10 tablet, Rfl: 0 .  potassium chloride SA (K-DUR,KLOR-CON) 20 MEQ tablet, Take 20 mEq by mouth daily as needed (take with lasix.)., Disp: , Rfl:  .  senna-docusate (SENOKOT-S) 8.6-50 MG per tablet, Take 1 tablet by mouth at bedtime., Disp: , Rfl:  .  sertraline (ZOLOFT) 25 MG tablet, Take 75 mg by mouth daily., Disp: , Rfl:  .  Menthol, Topical Analgesic, 5 % PADS, Apply topically. Apply  to lower back every 8 hours, Disp: , Rfl:   Allergies:  Allergies  Allergen Reactions  . Doxycycline Other (See Comments)    Destroyed good bacteria in GI tract  . Clarithromycin Other (See Comments)    H/A .[name]..patient unsure of reaction    . Prinivil [Lisinopril] Other (See Comments)    Patient unsure of reaction  . Propulsid [Cisapride] Other (See Comments)    Patient unsure of reaction    Past Medical History, Surgical history, Social history, and Family History were reviewed and updated.  Review of Systems: As above  Physical Exam:  height is  (1.575 m). Her oral temperature is 98 F (36.7 C). Her blood pressure is 118/66 and her pulse is 80. Her respiration is 16.   Elderly white female in no obvious distress.  Head and neck exam shows no ocular or oral lesions. She has no adenopathy in the neck. Lungs are with decreased breath sounds at the bases. Cardiac exam regular rate and rhythm with an occasional extra beat. She has a 1/6 systolic murmur. Abdomen is soft. She has no fluid wave. There is no palpable liver or spleen tip. Back exam shows no tenderness over the spine, ribs or hips. Extremities shows minimal chronic nonpitting edema of the lower legs. She has some osteoarthritic changes in her joints. Skin exam shows no rashes, ecchymoses or petechia. Neurological exam is nonfocal.  Lab Results  Component Value Date   WBC 5.6 01/06/2015   HGB 11.3* 01/06/2015   HCT 38.1 01/06/2015   MCV 95 01/06/2015   PLT 305 01/06/2015     Chemistry      Component Value Date/Time   NA 137 11/03/2014 1428   K 4.4 11/03/2014 1428   CL 100 11/03/2014 1428   CO2 28 11/03/2014 1428   BUN 14 11/03/2014 1428   CREATININE 0.92* 11/03/2014 1428      Component Value Date/Time   CALCIUM 9.0 11/03/2014 1428   ALKPHOS 142* 11/03/2014 1428   AST 20 11/03/2014 1428   ALT 19 11/03/2014 1428   BILITOT 0.5 11/03/2014 1428         Impression and Plan: [name] is an 81 year old white female with multi-factorial anemia.  She looks fairly good. I'm glad that she has not had any more instances. She did have the one episode in which her daughter said she fell back and sustained a laceration on her head.  She does not need any Aranesp today. I would not think that she would need any iron.  I will see her back in 2 months.  Josph Macho, MD 11/18/20164:29 PM

## 2015-01-06 NOTE — Progress Notes (Signed)
No treatment today. Hgb  11.3

## 2015-01-08 DIAGNOSIS — J9610 Chronic respiratory failure, unspecified whether with hypoxia or hypercapnia: Secondary | ICD-10-CM | POA: Insufficient documentation

## 2015-01-08 NOTE — Assessment & Plan Note (Signed)
No acute exacerbations. Control early sounds good. She has had flu shot. Transportation for her is difficult enough now and she is not requiring much intervention from us so I suggested we see [name] again as needed.

## 2015-01-08 NOTE — Assessment & Plan Note (Signed)
Heart rate is controlled at this visit, managed by cardiology

## 2015-01-09 ENCOUNTER — Telehealth: Payer: Self-pay

## 2015-01-09 LAB — IRON AND TIBC CHCC
%SAT: 26 % (ref 21–57)
IRON: 55 ug/dL (ref 41–142)
TIBC: 211 ug/dL — AB (ref 236–444)
UIBC: 156 ug/dL (ref 120–384)

## 2015-01-09 LAB — FERRITIN CHCC: Ferritin: 1589 ng/ml — ABNORMAL HIGH (ref 9–269)

## 2015-01-09 NOTE — Telephone Encounter (Addendum)
-----   Message from Josph MachoPeter R Ennever, MD sent at 01/09/2015  2:02 PM EST -----   Left message to patient daughter() regarding her iron being ok. Per Dr. Myna HidalgoEnnever.

## 2015-01-16 DIAGNOSIS — Z5189 Encounter for other specified aftercare: Secondary | ICD-10-CM | POA: Diagnosis not present

## 2015-01-16 DIAGNOSIS — I2510 Atherosclerotic heart disease of native coronary artery without angina pectoris: Secondary | ICD-10-CM | POA: Diagnosis not present

## 2015-01-16 DIAGNOSIS — G309 Alzheimer's disease, unspecified: Secondary | ICD-10-CM | POA: Diagnosis not present

## 2015-01-16 DIAGNOSIS — I482 Chronic atrial fibrillation: Secondary | ICD-10-CM | POA: Diagnosis not present

## 2015-01-16 DIAGNOSIS — K219 Gastro-esophageal reflux disease without esophagitis: Secondary | ICD-10-CM | POA: Diagnosis not present

## 2015-01-16 DIAGNOSIS — J439 Emphysema, unspecified: Secondary | ICD-10-CM | POA: Diagnosis not present

## 2015-01-16 DIAGNOSIS — Z4789 Encounter for other orthopedic aftercare: Secondary | ICD-10-CM | POA: Diagnosis not present

## 2015-01-16 DIAGNOSIS — I10 Essential (primary) hypertension: Secondary | ICD-10-CM | POA: Diagnosis not present

## 2015-01-21 DIAGNOSIS — J439 Emphysema, unspecified: Secondary | ICD-10-CM | POA: Diagnosis not present

## 2015-01-21 DIAGNOSIS — G309 Alzheimer's disease, unspecified: Secondary | ICD-10-CM | POA: Diagnosis not present

## 2015-01-21 DIAGNOSIS — R69 Illness, unspecified: Secondary | ICD-10-CM | POA: Diagnosis not present

## 2015-01-21 DIAGNOSIS — I482 Chronic atrial fibrillation: Secondary | ICD-10-CM | POA: Diagnosis not present

## 2015-01-26 DIAGNOSIS — H353233 Exudative age-related macular degeneration, bilateral, with inactive scar: Secondary | ICD-10-CM | POA: Diagnosis not present

## 2015-01-26 DIAGNOSIS — Z961 Presence of intraocular lens: Secondary | ICD-10-CM | POA: Diagnosis not present

## 2015-01-26 DIAGNOSIS — H43813 Vitreous degeneration, bilateral: Secondary | ICD-10-CM | POA: Diagnosis not present

## 2015-01-26 DIAGNOSIS — G301 Alzheimer's disease with late onset: Secondary | ICD-10-CM | POA: Diagnosis not present

## 2015-01-26 DIAGNOSIS — R69 Illness, unspecified: Secondary | ICD-10-CM | POA: Diagnosis not present

## 2015-03-09 ENCOUNTER — Ambulatory Visit (HOSPITAL_BASED_OUTPATIENT_CLINIC_OR_DEPARTMENT_OTHER): Payer: Medicare HMO | Admitting: Family

## 2015-03-09 ENCOUNTER — Other Ambulatory Visit (HOSPITAL_BASED_OUTPATIENT_CLINIC_OR_DEPARTMENT_OTHER): Payer: Medicare HMO

## 2015-03-09 ENCOUNTER — Encounter: Payer: Self-pay | Admitting: Family

## 2015-03-09 VITALS — BP 122/68 | HR 89 | Temp 98.8°F | Resp 16 | Ht 62.0 in | Wt 130.0 lb

## 2015-03-09 DIAGNOSIS — N183 Chronic kidney disease, stage 3 (moderate): Secondary | ICD-10-CM

## 2015-03-09 DIAGNOSIS — D631 Anemia in chronic kidney disease: Secondary | ICD-10-CM

## 2015-03-09 DIAGNOSIS — D509 Iron deficiency anemia, unspecified: Secondary | ICD-10-CM | POA: Diagnosis not present

## 2015-03-09 LAB — CBC WITH DIFFERENTIAL (CANCER CENTER ONLY)
BASO#: 0 10*3/uL (ref 0.0–0.2)
BASO%: 0.4 % (ref 0.0–2.0)
EOS%: 6 % (ref 0.0–7.0)
Eosinophils Absolute: 0.4 10*3/uL (ref 0.0–0.5)
HEMATOCRIT: 38.4 % (ref 34.8–46.6)
HGB: 11.8 g/dL (ref 11.6–15.9)
LYMPH#: 1.3 10*3/uL (ref 0.9–3.3)
LYMPH%: 19.4 % (ref 14.0–48.0)
MCH: 29.6 pg (ref 26.0–34.0)
MCHC: 30.7 g/dL — AB (ref 32.0–36.0)
MCV: 96 fL (ref 81–101)
MONO#: 0.6 10*3/uL (ref 0.1–0.9)
MONO%: 8.4 % (ref 0.0–13.0)
NEUT%: 65.8 % (ref 39.6–80.0)
NEUTROS ABS: 4.5 10*3/uL (ref 1.5–6.5)
Platelets: 314 10*3/uL (ref 145–400)
RBC: 3.99 10*6/uL (ref 3.70–5.32)
RDW: 13.8 % (ref 11.1–15.7)
WBC: 6.8 10*3/uL (ref 3.9–10.0)

## 2015-03-09 LAB — CMP (CANCER CENTER ONLY)
ALT: 20 U/L (ref 10–47)
AST: 28 U/L (ref 11–38)
Albumin: 3.4 g/dL (ref 3.3–5.5)
Alkaline Phosphatase: 99 U/L — ABNORMAL HIGH (ref 26–84)
BUN: 11 mg/dL (ref 7–22)
CHLORIDE: 98 meq/L (ref 98–108)
CO2: 33 mEq/L (ref 18–33)
CREATININE: 1 mg/dL (ref 0.6–1.2)
Calcium: 8.6 mg/dL (ref 8.0–10.3)
GLUCOSE: 127 mg/dL — AB (ref 73–118)
POTASSIUM: 4.1 meq/L (ref 3.3–4.7)
SODIUM: 144 meq/L (ref 128–145)
Total Bilirubin: 0.7 mg/dl (ref 0.20–1.60)
Total Protein: 7 g/dL (ref 6.4–8.1)

## 2015-03-09 NOTE — Progress Notes (Signed)
Hematology and Oncology Follow Up Visit  Ellen [name] [id number] 1926/12/17 82 y.o. 03/09/2015   Principle Diagnosis:  1. Anemia of renal insufficiency.  2. Intermittent iron-deficiency anemia.   Current Therapy:   1. Aranesp 300 mcg as needed for hemoglobin less than 11.  2. IV iron as indicated - last received in May 2016    Interim History: Ellen [name] is here today with a care giver for a follow-up. She is doing well and has no complaints at this time. She is pleasantly confused and cannot remember why she comes to our office. We talked about her anemia and she was pleased to hear that her Hgb was above 10 and that she not need Aranesp today. She is taking Namenda XR daily.  She is enjoying loving at an assisted living facility. She states that her appetite is "too good." She is staying hydrated and her weight is unchanged at 10 lbs.  She has some mild SOB at times with exertion. She can not walk without assistance. She uses a wheelchair most of the time.  No lymphadenopathy found on exam. She denies any episodes of bleeding or bruising.  No fever, chills, n/v, rash, headache, chest pain, palpitations, abdominal pain, constipation, diarrhea, blood in urine or stool.  No swelling, tenderness, numbness or tingling in her extremities. No c/o pain at this time.   Medications:    Medication List       This list is accurate as of: 03/09/15  2:33 PM.  Always use your most recent med list.               ALPRAZolam 0.25 MG tablet  Commonly known as:  XANAX  Take 1 tablet (0.25 mg total) by mouth at bedtime.     aspirin 81 MG EC tablet  Take 1 tablet (81 mg total) by mouth daily with breakfast.     calcium-vitamin D 500-200 MG-UNIT tablet  Commonly known as:  OSCAL WITH D  Take 1 tablet by mouth.     diltiazem 360 MG 24 hr capsule  Commonly known as:  CARDIZEM CD  Take 1 capsule (360 mg total) by mouth daily.     EYE VITAMINS Caps  Take 1 capsule by mouth daily.     famotidine 20 MG tablet  Commonly known as:  PEPCID  Take 20 mg by mouth daily.     furosemide 20 MG tablet  Commonly known as:  LASIX  Take 20 mg by mouth daily as needed (take if gains 3lbs in 1 day, short of breath, swelling.).     Menthol (Topical Analgesic) 5 % Pads  Apply topically. Apply to lower back every 8 hours     metoprolol tartrate 25 MG tablet  Commonly known as:  LOPRESSOR  Take 1 tablet (25 mg total) by mouth 2 (two) times daily.     mirtazapine 7.5 MG tablet  Commonly known as:  REMERON  Take 7.5 mg by mouth at bedtime.     multivitamin tablet  Take 1 tablet by mouth daily.     NAMENDA XR 28 MG Cp24 24 hr capsule  Generic drug:  memantine  Take 28 mg by mouth daily.     NITROSTAT 0.4 MG SL tablet  Generic drug:  nitroGLYCERIN  Place 0.4 mg under the tongue every 5 (five) minutes as needed for chest pain. Use as directed as needed     oxyCODONE 5 MG immediate release tablet  Commonly known as:  Oxy IR/ROXICODONE  Take  1 tablet (5 mg total) by mouth every 6 (six) hours as needed for moderate pain.     potassium chloride SA 20 MEQ tablet  Commonly known as:  K-DUR,KLOR-CON  Take 20 mEq by mouth daily as needed (take with lasix.).     senna-docusate 8.6-50 MG tablet  Commonly known as:  Senokot-S  Take 1 tablet by mouth at bedtime.     sertraline 25 MG tablet  Commonly known as:  ZOLOFT  Take 75 mg by mouth daily.        Allergies:  Allergies  Allergen Reactions  . Doxycycline Other (See Comments)    Destroyed good bacteria in GI tract  . Clarithromycin Other (See Comments)    H/A .[name]..patient unsure of reaction    . Prinivil [Lisinopril] Other (See Comments)    Patient unsure of reaction  . Propulsid [Cisapride] Other (See Comments)    Patient unsure of reaction    Past Medical History, Surgical history, Social history, and Family History were reviewed and updated.  Review of Systems: All other 10 point review of systems is negative.    Physical Exam:  vitals were not taken for this visit.  Wt Readings from Last 3 Encounters:  11/07/14 129 lb (58.514 kg)  10/20/14 129 lb (58.514 kg)  10/02/14 135 lb 2.3 oz (61.3 kg)    Ocular: Sclerae unicteric, pupils equal, round and reactive to light Ear-nose-throat: Oropharynx clear, dentition fair Lymphatic: No cervical supraclavicular or axillary adenopathy Lungs no rales or rhonchi, good excursion bilaterally Heart regular rate and rhythm, no murmur appreciated Abd soft, nontender, positive bowel sounds, no liver or spleen tip palpated on exam MSK no focal spinal tenderness, no joint edema Neuro: non-focal, well-oriented, appropriate affect Breasts: Deferred  Lab Results  Component Value Date   WBC 6.8 03/09/2015   HGB 11.8 03/09/2015   HCT 38.4 03/09/2015   MCV 96 03/09/2015   PLT 314 03/09/2015   Lab Results  Component Value Date   FERRITIN 1,589 Result Confirmed by Automated Dilution.* 01/06/2015   IRON 55 01/06/2015   TIBC 211* 01/06/2015   UIBC 156 01/06/2015   IRONPCTSAT 26 01/06/2015   Lab Results  Component Value Date   RETICCTPCT 1.1 09/30/2014   RBC 3.99 03/09/2015   RETICCTABS 61.3 06/30/2013   No results found for: KPAFRELGTCHN, LAMBDASER, KAPLAMBRATIO No results found for: IGGSERUM, IGA, IGMSERUM No results found for: Dorene Ar, A1GS, A2GS, Karn Pickler, SPEI   Chemistry      Component Value Date/Time   NA 139 01/06/2015 1451   K 4.0 01/06/2015 1451   CL 98 01/06/2015 1451   CO2 39* 01/06/2015 1451   BUN 11 01/06/2015 1451   CREATININE 0.72 01/06/2015 1451      Component Value Date/Time   CALCIUM 8.3* 01/06/2015 1451   ALKPHOS 122 01/06/2015 1451   AST 24 01/06/2015 1451   ALT 17 01/06/2015 1451   BILITOT 0.4 01/06/2015 1451     Impression and Plan: Ellen [name] is a 11-year-old white female with multi-factorial anemia. She is doing well and has no complaints at this time. Her Hgb is stable at 11.8. She  will not need an Aranesp injection today.  We will see what her iron studies show and bring her back in next week for Feraheme if needed.  We will plan to see her back again in 2 months for labs and follow-up.  She and her daughter both know to call here with any questions or concerns.  We can certainly see her back sooner if need be.   Verdie Mosher, NP 1/19/20172:33 PM

## 2015-03-10 LAB — IRON AND TIBC
%SAT: 18 % — ABNORMAL LOW (ref 21–57)
Iron: 39 ug/dL — ABNORMAL LOW (ref 41–142)
TIBC: 213 ug/dL — ABNORMAL LOW (ref 236–444)
UIBC: 173 ug/dL (ref 120–384)

## 2015-03-10 LAB — RETICULOCYTES: RETICULOCYTE COUNT: 1.5 % (ref 0.6–2.6)

## 2015-03-10 LAB — FERRITIN: Ferritin: 1499 ng/ml — ABNORMAL HIGH (ref 9–269)

## 2015-03-12 ENCOUNTER — Other Ambulatory Visit: Payer: Self-pay | Admitting: Family

## 2015-03-13 ENCOUNTER — Other Ambulatory Visit: Payer: Self-pay | Admitting: *Deleted

## 2015-03-13 ENCOUNTER — Telehealth: Payer: Self-pay | Admitting: *Deleted

## 2015-03-13 DIAGNOSIS — N183 Chronic kidney disease, stage 3 unspecified: Secondary | ICD-10-CM

## 2015-03-13 DIAGNOSIS — D631 Anemia in chronic kidney disease: Secondary | ICD-10-CM

## 2015-03-13 NOTE — Telephone Encounter (Addendum)
Patient's daughter aware of results. Message sent to scheduler.   ----- Message from Verdie Mosher, NP sent at 03/12/2015  7:35 PM EST ----- Regarding: Iron Iron saturation was low. Needs 1 dose this week please. Thank you!  Maralyn Sago

## 2015-03-17 DIAGNOSIS — J439 Emphysema, unspecified: Secondary | ICD-10-CM | POA: Diagnosis not present

## 2015-03-17 DIAGNOSIS — I10 Essential (primary) hypertension: Secondary | ICD-10-CM | POA: Diagnosis not present

## 2015-03-17 DIAGNOSIS — G309 Alzheimer's disease, unspecified: Secondary | ICD-10-CM | POA: Diagnosis not present

## 2015-03-23 ENCOUNTER — Ambulatory Visit (HOSPITAL_BASED_OUTPATIENT_CLINIC_OR_DEPARTMENT_OTHER): Payer: Medicare HMO

## 2015-03-23 VITALS — BP 122/71 | HR 81 | Temp 97.8°F

## 2015-03-23 DIAGNOSIS — D509 Iron deficiency anemia, unspecified: Secondary | ICD-10-CM

## 2015-03-23 DIAGNOSIS — D631 Anemia in chronic kidney disease: Secondary | ICD-10-CM

## 2015-03-23 DIAGNOSIS — N183 Chronic kidney disease, stage 3 unspecified: Secondary | ICD-10-CM

## 2015-03-23 MED ORDER — SODIUM CHLORIDE 0.9 % IV SOLN
510.0000 mg | Freq: Once | INTRAVENOUS | Status: AC
  Administered 2015-03-23: 510 mg via INTRAVENOUS
  Filled 2015-03-23: qty 17

## 2015-03-23 MED ORDER — SODIUM CHLORIDE 0.9 % IV SOLN
INTRAVENOUS | Status: DC
  Administered 2015-03-23: 13:00:00 via INTRAVENOUS

## 2015-03-23 NOTE — Patient Instructions (Signed)

## 2015-03-24 DIAGNOSIS — Z79899 Other long term (current) drug therapy: Secondary | ICD-10-CM | POA: Diagnosis not present

## 2015-04-27 DIAGNOSIS — Z961 Presence of intraocular lens: Secondary | ICD-10-CM | POA: Diagnosis not present

## 2015-04-27 DIAGNOSIS — H43813 Vitreous degeneration, bilateral: Secondary | ICD-10-CM | POA: Diagnosis not present

## 2015-04-27 DIAGNOSIS — R69 Illness, unspecified: Secondary | ICD-10-CM | POA: Diagnosis not present

## 2015-04-27 DIAGNOSIS — H353233 Exudative age-related macular degeneration, bilateral, with inactive scar: Secondary | ICD-10-CM | POA: Diagnosis not present

## 2015-04-27 DIAGNOSIS — H3322 Serous retinal detachment, left eye: Secondary | ICD-10-CM | POA: Diagnosis not present

## 2015-04-27 DIAGNOSIS — G301 Alzheimer's disease with late onset: Secondary | ICD-10-CM | POA: Diagnosis not present

## 2015-05-11 ENCOUNTER — Encounter: Payer: Self-pay | Admitting: Hematology & Oncology

## 2015-05-11 ENCOUNTER — Ambulatory Visit: Payer: Medicare HMO

## 2015-05-11 ENCOUNTER — Ambulatory Visit (HOSPITAL_BASED_OUTPATIENT_CLINIC_OR_DEPARTMENT_OTHER): Payer: Medicare HMO | Admitting: Hematology & Oncology

## 2015-05-11 ENCOUNTER — Other Ambulatory Visit (HOSPITAL_BASED_OUTPATIENT_CLINIC_OR_DEPARTMENT_OTHER): Payer: Medicare HMO

## 2015-05-11 VITALS — BP 106/48 | HR 62 | Temp 97.4°F | Resp 18 | Ht 62.0 in | Wt 131.0 lb

## 2015-05-11 DIAGNOSIS — D631 Anemia in chronic kidney disease: Secondary | ICD-10-CM

## 2015-05-11 DIAGNOSIS — N183 Chronic kidney disease, stage 3 unspecified: Secondary | ICD-10-CM

## 2015-05-11 DIAGNOSIS — D509 Iron deficiency anemia, unspecified: Secondary | ICD-10-CM | POA: Diagnosis not present

## 2015-05-11 DIAGNOSIS — D649 Anemia, unspecified: Secondary | ICD-10-CM | POA: Diagnosis not present

## 2015-05-11 DIAGNOSIS — I509 Heart failure, unspecified: Secondary | ICD-10-CM | POA: Diagnosis not present

## 2015-05-11 DIAGNOSIS — N189 Chronic kidney disease, unspecified: Secondary | ICD-10-CM | POA: Diagnosis not present

## 2015-05-11 LAB — COMPREHENSIVE METABOLIC PANEL
ALT: 12 U/L (ref 0–55)
AST: 21 U/L (ref 5–34)
Albumin: 3.6 g/dL (ref 3.5–5.0)
Alkaline Phosphatase: 106 U/L (ref 40–150)
Anion Gap: 7 mEq/L (ref 3–11)
BUN: 15.7 mg/dL (ref 7.0–26.0)
CHLORIDE: 99 meq/L (ref 98–109)
CO2: 33 mEq/L — ABNORMAL HIGH (ref 22–29)
Calcium: 9.1 mg/dL (ref 8.4–10.4)
Creatinine: 1 mg/dL (ref 0.6–1.1)
EGFR: 53 mL/min/{1.73_m2} — ABNORMAL LOW (ref >90)
GLUCOSE: 168 mg/dL — AB (ref 70–140)
POTASSIUM: 4.3 meq/L (ref 3.5–5.1)
SODIUM: 139 meq/L (ref 136–145)
Total Bilirubin: 0.35 mg/dL (ref 0.20–1.20)
Total Protein: 7 g/dL (ref 6.4–8.3)

## 2015-05-11 LAB — CBC WITH DIFFERENTIAL (CANCER CENTER ONLY)
BASO#: 0.1 10*3/uL (ref 0.0–0.2)
BASO%: 1.1 % (ref 0.0–2.0)
EOS%: 7.5 % — AB (ref 0.0–7.0)
Eosinophils Absolute: 0.5 10*3/uL (ref 0.0–0.5)
HCT: 38.2 % (ref 34.8–46.6)
HGB: 12.1 g/dL (ref 11.6–15.9)
LYMPH#: 1.5 10*3/uL (ref 0.9–3.3)
LYMPH%: 23.8 % (ref 14.0–48.0)
MCH: 30.6 pg (ref 26.0–34.0)
MCHC: 31.7 g/dL — AB (ref 32.0–36.0)
MCV: 97 fL (ref 81–101)
MONO#: 0.6 10*3/uL (ref 0.1–0.9)
MONO%: 9.3 % (ref 0.0–13.0)
NEUT#: 3.8 10*3/uL (ref 1.5–6.5)
NEUT%: 58.3 % (ref 39.6–80.0)
PLATELETS: 277 10*3/uL (ref 145–400)
RBC: 3.95 10*6/uL (ref 3.70–5.32)
RDW: 13.1 % (ref 11.1–15.7)
WBC: 6.4 10*3/uL (ref 3.9–10.0)

## 2015-05-11 NOTE — Progress Notes (Signed)
Hematology and Oncology Follow Up Visit  Ellen [name] [id number] 06-29-1926 82 y.o. 05/11/2015   Principle Diagnosis:  . Anemia of renal insufficiency. 2. Intermittent iron-deficiency anemia. 3. Chronic congestive heart failure.  Current Therapy:   1. Aranesp 300 mcg as needed for hemoglobin less than 11. 2. IV iron as indicated.     Interim History:  Ellen [name] is back for followup. She is in a nursing home. She seems be doing pretty well. Her weight is down.  She has chronic oxygen. She wears this all the time period. She does have congestive heart failure. She is losing some weight. I think this might be indicative of the stress of the heart failure.  She said that she "missed my last appointment" because her mother passed on. She does have the dementia. This appears to be ultimately stable.  When she was seen back in January, her ferritin was 1500 but her iron saturation was only 18%. Last time we gave her iron was back in February 2017.  Her appetite is down a little bit.  She's had no pain. She's had no rashes. She's had no obvious change in bowel or bladder habits.   Overall, her performance status was ECOG 3  Medications:  Current outpatient prescriptions:  .  ALPRAZolam (XANAX) 0.25 MG tablet, Take 1 tablet (0.25 mg total) by mouth at bedtime., Disp: 10 tablet, Rfl: 0 .  aspirin EC 81 MG EC tablet, Take 1 tablet (81 mg total) by mouth daily with breakfast., Disp: , Rfl:  .  calcium-vitamin D (OSCAL WITH D) 500-200 MG-UNIT per tablet, Take 1 tablet by mouth., Disp: , Rfl:  .  diltiazem (CARDIZEM CD) 360 MG 24 hr capsule, Take 1 capsule (360 mg total) by mouth daily., Disp: , Rfl:  .  famotidine (PEPCID) 20 MG tablet, Take 20 mg by mouth daily., Disp: , Rfl:  .  furosemide (LASIX) 20 MG tablet, Take 20 mg by mouth daily as needed (take if gains 3lbs in 1 day, short of breath, swelling.)., Disp: , Rfl:  .  memantine (NAMENDA XR) 28 MG CP24 24 hr capsule, Take 28 mg by mouth  daily., Disp: , Rfl:  .  Menthol, Topical Analgesic, 5 % PADS, Apply topically. Apply to lower back every 8 hours, Disp: , Rfl:  .  metoprolol tartrate (LOPRESSOR) 25 MG tablet, Take 1 tablet (25 mg total) by mouth 2 (two) times daily., Disp: 60 tablet, Rfl: 6 .  mirtazapine (REMERON) 7.5 MG tablet, Take 7.5 mg by mouth at bedtime., Disp: , Rfl:  .  Multiple Vitamin (MULTIVITAMIN) tablet, Take 1 tablet by mouth daily., Disp: , Rfl:  .  Multiple Vitamins-Minerals (EYE VITAMINS) CAPS, Take 1 capsule by mouth daily. , Disp: , Rfl:  .  NITROSTAT 0.4 MG SL tablet, Place 0.4 mg under the tongue every 5 (five) minutes as needed for chest pain. Use as directed as needed, Disp: , Rfl:  .  oxyCODONE (OXY IR/ROXICODONE) 5 MG immediate release tablet, Take 1 tablet (5 mg total) by mouth every 6 (six) hours as needed for moderate pain., Disp: 10 tablet, Rfl: 0 .  potassium chloride SA (K-DUR,KLOR-CON) 20 MEQ tablet, Take 20 mEq by mouth daily as needed (take with lasix.)., Disp: , Rfl:  .  rivastigmine (EXELON) 3 MG capsule, Take 3 mg by mouth., Disp: , Rfl:  .  senna-docusate (SENOKOT-S) 8.6-50 MG per tablet, Take 1 tablet by mouth at bedtime., Disp: , Rfl:  .  sertraline (ZOLOFT) 25  MG tablet, Take 75 mg by mouth daily., Disp: , Rfl:   Allergies:  Allergies  Allergen Reactions  . Doxycycline Other (See Comments)    Destroyed good bacteria in GI tract  . Clarithromycin Other (See Comments)    H/A .[name]...patient unsure of reaction    . Prinivil [Lisinopril] Other (See Comments)    Patient unsure of reaction  . Propulsid [Cisapride] Other (See Comments)    Patient unsure of reaction    Past Medical History, Surgical history, Social history, and Family History were reviewed and updated.  Review of Systems: As above  Physical Exam:  height is 5\' 2"  (1.575 m) and weight is 131 lb (59.421 kg). Her oral temperature is 97.4 F (36.3 C). Her blood pressure is 106/48 and her pulse is 62. Her respiration is  18.   Elderly white female in no obvious distress. Head and neck exam shows no ocular or oral lesions. She has no adenopathy in the neck. Lungs are with decreased breath sounds at the bases. Cardiac exam regular rate and rhythm with an occasional extra beat. She has a 1/6 systolic murmur. Abdomen is soft. She has no fluid wave. There is no palpable liver or spleen tip. Back exam shows no tenderness over the spine, ribs or hips. Extremities shows minimal chronic nonpitting edema of the lower legs. She has some osteoarthritic changes in her joints. Skin exam shows no rashes, ecchymoses or petechia. Neurological exam is nonfocal.  Lab Results  Component Value Date   WBC 6.4 05/11/2015   HGB 12.1 05/11/2015   HCT 38.2 05/11/2015   MCV 97 05/11/2015   PLT 277 05/11/2015     Chemistry      Component Value Date/Time   NA 144 03/09/2015 1413   NA 139 01/06/2015 1451   K 4.1 03/09/2015 1413   K 4.0 01/06/2015 1451   CL 98 03/09/2015 1413   CL 98 01/06/2015 1451   CO2 33 03/09/2015 1413   CO2 39* 01/06/2015 1451   BUN 11 03/09/2015 1413   BUN 11 01/06/2015 1451   CREATININE 1.0 03/09/2015 1413   CREATININE 0.72 01/06/2015 1451      Component Value Date/Time   CALCIUM 8.6 03/09/2015 1413   CALCIUM 8.3* 01/06/2015 1451   ALKPHOS 99* 03/09/2015 1413   ALKPHOS 122 01/06/2015 1451   AST 28 03/09/2015 1413   AST 24 01/06/2015 1451   ALT 20 03/09/2015 1413   ALT 17 01/06/2015 1451   BILITOT 0.70 03/09/2015 1413   BILITOT 0.4 01/06/2015 1451         Impression and Plan: [name] is an 82 year old white female with multi-factorial anemia.  It looks like that she has lost some weight. I'm not sure this is just from her chronic illnesses.  She does not need any Aranesp today. Her blood count actually is doing quite well. .  I will see her back in 3 months.  Josph MachoENNEVER,PETER R, MD 3/23/20172:57 PM

## 2015-05-12 LAB — RETICULOCYTES: Reticulocyte Count: 1.2 % (ref 0.6–2.6)

## 2015-05-12 LAB — IRON AND TIBC
%SAT: 33 % (ref 21–57)
IRON: 76 ug/dL (ref 41–142)
TIBC: 231 ug/dL — ABNORMAL LOW (ref 236–444)
UIBC: 156 ug/dL (ref 120–384)

## 2015-05-12 LAB — FERRITIN

## 2015-06-02 DIAGNOSIS — G309 Alzheimer's disease, unspecified: Secondary | ICD-10-CM | POA: Diagnosis not present

## 2015-06-02 DIAGNOSIS — J439 Emphysema, unspecified: Secondary | ICD-10-CM | POA: Diagnosis not present

## 2015-06-02 DIAGNOSIS — I482 Chronic atrial fibrillation: Secondary | ICD-10-CM | POA: Diagnosis not present

## 2015-06-02 DIAGNOSIS — R69 Illness, unspecified: Secondary | ICD-10-CM | POA: Diagnosis not present

## 2015-06-07 DIAGNOSIS — Z20828 Contact with and (suspected) exposure to other viral communicable diseases: Secondary | ICD-10-CM | POA: Diagnosis not present

## 2015-06-26 ENCOUNTER — Encounter: Payer: Self-pay | Admitting: Interventional Cardiology

## 2015-06-26 ENCOUNTER — Ambulatory Visit (INDEPENDENT_AMBULATORY_CARE_PROVIDER_SITE_OTHER): Payer: Medicare HMO | Admitting: Interventional Cardiology

## 2015-06-26 VITALS — BP 116/54 | HR 86 | Ht 62.0 in | Wt 132.2 lb

## 2015-06-26 DIAGNOSIS — I5032 Chronic diastolic (congestive) heart failure: Secondary | ICD-10-CM | POA: Diagnosis not present

## 2015-06-26 DIAGNOSIS — I4891 Unspecified atrial fibrillation: Secondary | ICD-10-CM

## 2015-06-26 DIAGNOSIS — I2510 Atherosclerotic heart disease of native coronary artery without angina pectoris: Secondary | ICD-10-CM | POA: Diagnosis not present

## 2015-06-26 DIAGNOSIS — J449 Chronic obstructive pulmonary disease, unspecified: Secondary | ICD-10-CM

## 2015-06-26 DIAGNOSIS — I10 Essential (primary) hypertension: Secondary | ICD-10-CM

## 2015-06-26 DIAGNOSIS — G5682 Other specified mononeuropathies of left upper limb: Secondary | ICD-10-CM

## 2015-06-26 NOTE — Patient Instructions (Signed)
Medication Instructions:  Your physician recommends that you continue on your current medications as directed. Please refer to the Current Medication list given to you today.  Labwork: None ordered.  Testing/Procedures: None ordered.  Follow-Up: Your physician recommends that you schedule a follow-up appointment as needed.   Any Other Special Instructions Will Be Listed Below (If Applicable).     If you need a refill on your cardiac medications before your next appointment, please call your pharmacy.   

## 2015-06-26 NOTE — Progress Notes (Signed)
Cardiology Office Note   Date:  06/26/2015   ID:  Ellen [name], DOB 02-Mar-1926, MRN [id number]  PCP:  Ellen [name], IMRAN P, MD  Cardiologist:  Ellen [name],Ellen Vath W, MD   Chief Complaint  Patient presents with  . Congestive Heart Failure      History of Present Illness: Ellen [name] is a 82 y.o. female who presents for CAD, known LIMA bypass graft to LAD-remote, extreme dementia, chronic obstructive pulmonary disease and iron deficiency anemia.  Overall no complaints. She is accompanied by a nursing assistant from the skilled nursing facility where she resides. She has lost weight.  Past Medical History  Diagnosis Date  . Chronic airway obstruction, not elsewhere classified   . Allergic rhinitis, cause unspecified   . Esophageal reflux   . Iron deficiency anemia, unspecified   . Coronary atherosclerosis of unspecified type of vessel, native or graft   . Dysfunction of eustachian tube   . Obesity, unspecified   . Macular degeneration, right eye Being treated.  . Macular degeneration, left eye Blind   . Anemia of chronic renal failure, stage 3 (moderate) 11/03/2014  . Erythropoietin deficiency anemia 11/03/2014    Past Surgical History  Procedure Laterality Date  . Coronary artery bypass graft    . Hip arthroplasty Left 09/26/2014    Procedure: ARTHROPLASTY MONOPOLAR HIP (HEMIARTHROPLASTY);  Surgeon: Eldred MangesMark C Yates, MD;  [facility];  Service: Orthopedics;  Laterality: Left;     Current Outpatient Prescriptions  Medication Sig Dispense Refill  . acetaminophen (TYLENOL) 325 MG tablet Take 650 mg by mouth 2 (two) times daily.    [name]. acetaminophen (TYLENOL) 325 MG tablet Take 650 mg by mouth every 8 (eight) hours as needed for mild pain.    [name]. ALPRAZolam (XANAX) 0.25 MG tablet Take 1 tablet (0.25 mg total) by mouth at bedtime. 10 tablet 0  . aspirin EC 81 MG EC tablet Take 1 tablet (81 mg total) by mouth daily with breakfast.    . calcium-vitamin D (OSCAL WITH D) 500-200 MG-UNIT per  tablet Take 1 tablet by mouth 2 (two) times daily.     [name]. diltiazem (CARDIZEM CD) 360 MG 24 hr capsule Take 1 capsule (360 mg total) by mouth daily.    . famotidine (PEPCID) 20 MG tablet Take 20 mg by mouth daily.    . furosemide (LASIX) 20 MG tablet Take 20 mg by mouth daily as needed (take if gains 3lbs in 1 day, short of breath, swelling.).    [name]. memantine (NAMENDA XR) 28 MG CP24 24 hr capsule Take 28 mg by mouth daily.    . Menthol, Topical Analgesic, 5 % PADS Apply topically. Apply to lower back every 8 hours    . metoprolol tartrate (LOPRESSOR) 25 MG tablet Take 1 tablet (25 mg total) by mouth 2 (two) times daily. 60 tablet 6  . mirtazapine (REMERON) 7.5 MG tablet Take 7.5 mg by mouth at bedtime.    . Multiple Vitamin (MULTIVITAMIN) tablet Take 1 tablet by mouth daily.    . Multiple Vitamins-Minerals (EYE VITAMINS) CAPS Take 1 capsule by mouth daily.     [name]. NITROSTAT 0.4 MG SL tablet Place 0.4 mg under the tongue every 5 (five) minutes as needed for chest pain. Use as directed as needed    . potassium chloride (K-DUR) 10 MEQ tablet Take 20 mEq by mouth daily as needed (Take with Lasix).    . rivastigmine (EXELON) 3 MG capsule Take 3 mg by mouth.    .[name]  senna-docusate (SENOKOT-S) 8.6-50 MG per tablet Take 1 tablet by mouth at bedtime.    . sertraline (ZOLOFT) 100 MG tablet Take 100 mg by mouth daily.    [name] triamcinolone cream (KENALOG) 0.1 % Apply 1 application topically 2 (two) times daily as needed (To arm for itching).     No current facility-administered medications for this visit.    Allergies:   Doxycycline; Clarithromycin; Prinivil; and Propulsid    Social History:  The patient  reports that she has never smoked. She has never used smokeless tobacco.   Family History:  The patient's family history includes Breast cancer in her sister; Colon cancer in her brother; Heart disease in her father and mother; Hypertension in her father and mother.    ROS:  Please see the history of present  illness.   Otherwise, review of systems are positive for She voices no complaints..   All other systems are reviewed and negative.    PHYSICAL EXAM: VS:  BP 116/54 mmHg  Pulse 86  Ht  (1.575 m)  Wt 132 lb 3.2 oz (59.966 kg)  BMI 24.17 kg/m2 , BMI Body mass index is 24.17 kg/(m^2). GEN: Well nourished, well developed, in no acute distress HEENT: normal Neck: no JVD, carotid bruits, or masses Cardiac: RRR.  There is no murmur, rub, or gallop. There is no edema. Respiratory:  clear to auscultation bilaterally, normal work of breathing. GI: soft, nontender, nondistended, + BS MS: no deformity or atrophy Skin: warm and dry, no rash Neuro:  Strength and sensation are intact Psych: euthymic mood, full affect   EKG:  EKG is not ordered today.    Recent Labs: 09/25/2014: TSH 0.914 10/03/2014: Magnesium 1.9 05/11/2015: ALT 12; BUN 15.7; Creatinine 1.0; HGB 12.1; Platelets 277; Potassium 4.3; Sodium 139    Lipid Panel No results found for: CHOL, TRIG, HDL, CHOLHDL, VLDL, LDLCALC, LDLDIRECT    Wt Readings from Last 3 Encounters:  06/26/15 132 lb 3.2 oz (59.966 kg)  05/11/15 131 lb (59.421 kg)  03/09/15 130 lb (58.968 kg)      Other studies Reviewed: Additional studies/ records that were reviewed today include: No recent hospitalizations or new data available..    ASSESSMENT AND PLAN:  1. Atherosclerosis of native coronary artery of native heart without angina pectoris Asymptomatic  2. Chronic diastolic heart failure (HCC) No volume overload  3. Essential hypertension, benign Excellent control  4. Atrial fibrillation, unspecified Rhythm is regular  5. COPD with chronic bronchitis (HCC) Chronic O2 therapy  6. Paralysis of diaphragm nerve, left Not assessed    Current medicines are reviewed at length with the patient today.  The patient has the following concerns regarding medicines: None.  The following changes/actions have been instituted:    None other  than converting follow-up to as needed. Dementia is so profound that routine follow-up does not seem prudent  Labs/ tests ordered today include:  No orders of the defined types were placed in this encounter.     Disposition:   FU with HS in when necessary   Signed, Ellen Noe, MD  06/26/2015 4:12 PM    [facility] [address] [place], [place], [place]  [address] Phone: [phone]; Fax: [phone]

## 2015-06-29 DIAGNOSIS — I4891 Unspecified atrial fibrillation: Secondary | ICD-10-CM | POA: Diagnosis not present

## 2015-06-29 DIAGNOSIS — J449 Chronic obstructive pulmonary disease, unspecified: Secondary | ICD-10-CM | POA: Diagnosis not present

## 2015-06-29 DIAGNOSIS — I10 Essential (primary) hypertension: Secondary | ICD-10-CM | POA: Diagnosis not present

## 2015-06-29 DIAGNOSIS — D6489 Other specified anemias: Secondary | ICD-10-CM | POA: Diagnosis not present

## 2015-06-30 DIAGNOSIS — R6889 Other general symptoms and signs: Secondary | ICD-10-CM | POA: Diagnosis not present

## 2015-06-30 DIAGNOSIS — D510 Vitamin B12 deficiency anemia due to intrinsic factor deficiency: Secondary | ICD-10-CM | POA: Diagnosis not present

## 2015-07-13 DIAGNOSIS — R69 Illness, unspecified: Secondary | ICD-10-CM | POA: Diagnosis not present

## 2015-07-13 DIAGNOSIS — N390 Urinary tract infection, site not specified: Secondary | ICD-10-CM | POA: Diagnosis not present

## 2015-07-14 DIAGNOSIS — Z1389 Encounter for screening for other disorder: Secondary | ICD-10-CM | POA: Diagnosis not present

## 2015-07-14 DIAGNOSIS — R109 Unspecified abdominal pain: Secondary | ICD-10-CM | POA: Diagnosis not present

## 2015-07-22 DIAGNOSIS — D508 Other iron deficiency anemias: Secondary | ICD-10-CM | POA: Diagnosis not present

## 2015-07-22 DIAGNOSIS — J449 Chronic obstructive pulmonary disease, unspecified: Secondary | ICD-10-CM | POA: Diagnosis not present

## 2015-07-22 DIAGNOSIS — K219 Gastro-esophageal reflux disease without esophagitis: Secondary | ICD-10-CM | POA: Diagnosis not present

## 2015-07-22 DIAGNOSIS — I2510 Atherosclerotic heart disease of native coronary artery without angina pectoris: Secondary | ICD-10-CM | POA: Diagnosis not present

## 2015-08-03 DIAGNOSIS — I4891 Unspecified atrial fibrillation: Secondary | ICD-10-CM | POA: Diagnosis not present

## 2015-08-03 DIAGNOSIS — D6489 Other specified anemias: Secondary | ICD-10-CM | POA: Diagnosis not present

## 2015-08-03 DIAGNOSIS — N390 Urinary tract infection, site not specified: Secondary | ICD-10-CM | POA: Diagnosis not present

## 2015-08-03 DIAGNOSIS — J449 Chronic obstructive pulmonary disease, unspecified: Secondary | ICD-10-CM | POA: Diagnosis not present

## 2015-08-08 DIAGNOSIS — R69 Illness, unspecified: Secondary | ICD-10-CM | POA: Diagnosis not present

## 2015-08-08 DIAGNOSIS — J449 Chronic obstructive pulmonary disease, unspecified: Secondary | ICD-10-CM | POA: Diagnosis not present

## 2015-08-08 DIAGNOSIS — R627 Adult failure to thrive: Secondary | ICD-10-CM | POA: Diagnosis not present

## 2015-08-08 DIAGNOSIS — N390 Urinary tract infection, site not specified: Secondary | ICD-10-CM | POA: Diagnosis not present

## 2015-08-09 DIAGNOSIS — Z79899 Other long term (current) drug therapy: Secondary | ICD-10-CM | POA: Diagnosis not present

## 2015-08-10 DIAGNOSIS — E876 Hypokalemia: Secondary | ICD-10-CM | POA: Diagnosis not present

## 2015-08-10 DIAGNOSIS — G4700 Insomnia, unspecified: Secondary | ICD-10-CM | POA: Diagnosis not present

## 2015-08-10 DIAGNOSIS — R627 Adult failure to thrive: Secondary | ICD-10-CM | POA: Diagnosis not present

## 2015-08-11 ENCOUNTER — Encounter: Payer: Self-pay | Admitting: Hematology & Oncology

## 2015-08-11 ENCOUNTER — Ambulatory Visit (HOSPITAL_BASED_OUTPATIENT_CLINIC_OR_DEPARTMENT_OTHER): Payer: Medicare HMO | Admitting: Hematology & Oncology

## 2015-08-11 ENCOUNTER — Ambulatory Visit: Payer: Medicare HMO

## 2015-08-11 ENCOUNTER — Other Ambulatory Visit (HOSPITAL_BASED_OUTPATIENT_CLINIC_OR_DEPARTMENT_OTHER): Payer: Medicare HMO

## 2015-08-11 VITALS — BP 100/52 | HR 95 | Temp 98.0°F | Resp 16 | Ht 62.0 in | Wt 124.0 lb

## 2015-08-11 DIAGNOSIS — I509 Heart failure, unspecified: Secondary | ICD-10-CM | POA: Diagnosis not present

## 2015-08-11 DIAGNOSIS — N183 Chronic kidney disease, stage 3 unspecified: Secondary | ICD-10-CM

## 2015-08-11 DIAGNOSIS — D509 Iron deficiency anemia, unspecified: Secondary | ICD-10-CM

## 2015-08-11 DIAGNOSIS — D631 Anemia in chronic kidney disease: Secondary | ICD-10-CM

## 2015-08-11 DIAGNOSIS — N189 Chronic kidney disease, unspecified: Secondary | ICD-10-CM

## 2015-08-11 DIAGNOSIS — Z9981 Dependence on supplemental oxygen: Secondary | ICD-10-CM

## 2015-08-11 DIAGNOSIS — R634 Abnormal weight loss: Secondary | ICD-10-CM

## 2015-08-11 LAB — CBC WITH DIFFERENTIAL (CANCER CENTER ONLY)
BASO#: 0.1 10*3/uL (ref 0.0–0.2)
BASO%: 0.8 % (ref 0.0–2.0)
EOS ABS: 0.3 10*3/uL (ref 0.0–0.5)
EOS%: 4 % (ref 0.0–7.0)
HEMATOCRIT: 39.4 % (ref 34.8–46.6)
HEMOGLOBIN: 12.7 g/dL (ref 11.6–15.9)
LYMPH#: 1.5 10*3/uL (ref 0.9–3.3)
LYMPH%: 24.7 % (ref 14.0–48.0)
MCH: 31.6 pg (ref 26.0–34.0)
MCHC: 32.2 g/dL (ref 32.0–36.0)
MCV: 98 fL (ref 81–101)
MONO#: 0.5 10*3/uL (ref 0.1–0.9)
MONO%: 7.7 % (ref 0.0–13.0)
NEUT%: 62.8 % (ref 39.6–80.0)
NEUTROS ABS: 3.9 10*3/uL (ref 1.5–6.5)
Platelets: 265 10*3/uL (ref 145–400)
RBC: 4.02 10*6/uL (ref 3.70–5.32)
RDW: 12.4 % (ref 11.1–15.7)
WBC: 6.2 10*3/uL (ref 3.9–10.0)

## 2015-08-11 NOTE — Progress Notes (Signed)
Hematology and Oncology Follow Up Visit  Ellen PettyBarbara [name] [id number] Mar 09, 1926 82 y.o. 08/11/2015   Principle Diagnosis:  . Anemia of renal insufficiency. 2. Intermittent iron-deficiency anemia. 3. Chronic congestive heart failure.  Current Therapy:   1. Aranesp 300 mcg as needed for hemoglobin less than 11. 2. IV iron as indicated.     Interim History:  Ms.  Ellen [name] is back for followup. She is in a nursing home. She seems be doing pretty well. Her weight is down. There is some concern about her not eating all that much. She just says that she is not hungry.   We last saw her in March, her ferritin was 1800 with her iron saturation of only 33%. A lot of the ferritin elevation is an acute phase reactant.  She has chronic oxygen. She wears this all the time period. She does have congestive heart failure. She is losing some weight. I think this might be indicative of the stress of the heart failure.  She's had no pain. She's had no rashes. She's had no obvious change in bowel or bladder habits.   she has not been hospitalized. Again her heart been doing fairly well.   Overall, her performance status was ECOG 3  Medications:  Current outpatient prescriptions:  .  acetaminophen (TYLENOL) 325 MG tablet, Take 650 mg by mouth 2 (two) times daily., Disp: , Rfl:  .  acetaminophen (TYLENOL) 325 MG tablet, Take 650 mg by mouth every 8 (eight) hours as needed for mild pain., Disp: , Rfl:  .  ALPRAZolam (XANAX) 0.25 MG tablet, Take 1 tablet (0.25 mg total) by mouth at bedtime., Disp: 10 tablet, Rfl: 0 .  aspirin EC 81 MG EC tablet, Take 1 tablet (81 mg total) by mouth daily with breakfast., Disp: , Rfl:  .  calcium-vitamin D (OSCAL WITH D) 500-200 MG-UNIT per tablet, Take 1 tablet by mouth 2 (two) times daily. , Disp: , Rfl:  .  diltiazem (CARDIZEM CD) 360 MG 24 hr capsule, Take 1 capsule (360 mg total) by mouth daily., Disp: , Rfl:  .  famotidine (PEPCID) 20 MG tablet, Take 20 mg by mouth daily.,  Disp: , Rfl:  .  furosemide (LASIX) 20 MG tablet, Take 20 mg by mouth daily as needed (take if gains 3lbs in 1 day, short of breath, swelling.)., Disp: , Rfl:  .  memantine (NAMENDA XR) 28 MG CP24 24 hr capsule, Take 28 mg by mouth daily., Disp: , Rfl:  .  Menthol, Topical Analgesic, 5 % PADS, Apply topically. Apply to lower back every 8 hours, Disp: , Rfl:  .  metoprolol tartrate (LOPRESSOR) 25 MG tablet, Take 1 tablet (25 mg total) by mouth 2 (two) times daily., Disp: 60 tablet, Rfl: 6 .  mirtazapine (REMERON) 7.5 MG tablet, Take 7.5 mg by mouth at bedtime., Disp: , Rfl:  .  Multiple Vitamin (MULTIVITAMIN) tablet, Take 1 tablet by mouth daily., Disp: , Rfl:  .  Multiple Vitamins-Minerals (EYE VITAMINS) CAPS, Take 1 capsule by mouth daily. , Disp: , Rfl:  .  NITROSTAT 0.4 MG SL tablet, Place 0.4 mg under the tongue every 5 (five) minutes as needed for chest pain. Use as directed as needed, Disp: , Rfl:  .  potassium chloride (K-DUR) 10 MEQ tablet, Take 20 mEq by mouth daily as needed (Take with Lasix)., Disp: , Rfl:  .  QUEtiapine (SEROQUEL) 25 MG tablet, , Disp: , Rfl:  .  rivastigmine (EXELON) 3 MG capsule, Take 3 mg by  mouth., Disp: , Rfl:  .  senna-docusate (SENOKOT-S) 8.6-50 MG per tablet, Take 1 tablet by mouth at bedtime., Disp: , Rfl:  .  sertraline (ZOLOFT) 100 MG tablet, Take 100 mg by mouth daily., Disp: , Rfl:  .  triamcinolone cream (KENALOG) 0.1 %, Apply 1 application topically 2 (two) times daily as needed (To arm for itching)., Disp: , Rfl:   Allergies:  Allergies  Allergen Reactions  . Doxycycline Other (See Comments)    Destroyed good bacteria in GI tract  . Clarithromycin Other (See Comments)    H/A .[name]...patient unsure of reaction    . Prinivil [Lisinopril] Other (See Comments)    Patient unsure of reaction  . Propulsid [Cisapride] Other (See Comments)    Patient unsure of reaction    Past Medical History, Surgical history, Social history, and Family History were  reviewed and updated.  Review of Systems: As above  Physical Exam:  height is 5\' 2"  (1.575 m) and weight is 124 lb (56.246 kg). Her oral temperature is 98 F (36.7 C). Her blood pressure is 100/52 and her pulse is 95. Her respiration is 16.   Elderly white female in no obvious distress. Head and neck exam shows no ocular or oral lesions. She has no adenopathy in the neck. Lungs are with decreased breath sounds at the bases. Cardiac exam regular rate and rhythm with an occasional extra beat. She has a 1/6 systolic murmur. Abdomen is soft. She has no fluid wave. There is no palpable liver or spleen tip. Back exam shows no tenderness over the spine, ribs or hips. Extremities shows minimal chronic nonpitting edema of the lower legs. She has some osteoarthritic changes in her joints. Skin exam shows no rashes, ecchymoses or petechia. Neurological exam is nonfocal.  Lab Results  Component Value Date   WBC 6.2 08/11/2015   HGB 12.7 08/11/2015   HCT 39.4 08/11/2015   MCV 98 08/11/2015   PLT 265 08/11/2015     Chemistry      Component Value Date/Time   NA 139 05/11/2015 1358   NA 144 03/09/2015 1413   NA 139 01/06/2015 1451   K 4.3 05/11/2015 1358   K 4.1 03/09/2015 1413   K 4.0 01/06/2015 1451   CL 98 03/09/2015 1413   CL 98 01/06/2015 1451   CO2 33* 05/11/2015 1358   CO2 33 03/09/2015 1413   CO2 39* 01/06/2015 1451   BUN 15.7 05/11/2015 1358   BUN 11 03/09/2015 1413   BUN 11 01/06/2015 1451   CREATININE 1.0 05/11/2015 1358   CREATININE 1.0 03/09/2015 1413   CREATININE 0.72 01/06/2015 1451      Component Value Date/Time   CALCIUM 9.1 05/11/2015 1358   CALCIUM 8.6 03/09/2015 1413   CALCIUM 8.3* 01/06/2015 1451   ALKPHOS 106 05/11/2015 1358   ALKPHOS 99* 03/09/2015 1413   ALKPHOS 122 01/06/2015 1451   AST 21 05/11/2015 1358   AST 28 03/09/2015 1413   AST 24 01/06/2015 1451   ALT 12 05/11/2015 1358   ALT 20 03/09/2015 1413   ALT 17 01/06/2015 1451   BILITOT 0.35 05/11/2015  1358   BILITOT 0.70 03/09/2015 1413   BILITOT 0.4 01/06/2015 1451         Impression and Plan: Ellen [name] is an 82 year old white female with multi-factorial anemia.   the weight loss is a little bit troublesome. At her age, it just might be a sign that she just is going to "slow down".  I don't see any evidence of heart failure. Her blood count is fantastic so she does not need any Aranesp. I would not  think that she will need any iron.   The because she is doing so well, that she probably can go back in 4 months.    Josph Macho, MD 6/23/20173:32 PM

## 2015-08-12 LAB — RETICULOCYTES: Reticulocyte Count: 1.1 % (ref 0.6–2.6)

## 2015-08-14 LAB — IRON AND TIBC
%SAT: 25 % (ref 21–57)
IRON: 54 ug/dL (ref 41–142)
TIBC: 216 ug/dL — AB (ref 236–444)
UIBC: 163 ug/dL (ref 120–384)

## 2015-08-14 LAB — FERRITIN

## 2015-09-06 DIAGNOSIS — H1011 Acute atopic conjunctivitis, right eye: Secondary | ICD-10-CM | POA: Diagnosis not present

## 2015-09-20 DIAGNOSIS — Z79899 Other long term (current) drug therapy: Secondary | ICD-10-CM | POA: Diagnosis not present

## 2015-10-01 DIAGNOSIS — D508 Other iron deficiency anemias: Secondary | ICD-10-CM | POA: Diagnosis not present

## 2015-10-01 DIAGNOSIS — K219 Gastro-esophageal reflux disease without esophagitis: Secondary | ICD-10-CM | POA: Diagnosis not present

## 2015-10-01 DIAGNOSIS — R69 Illness, unspecified: Secondary | ICD-10-CM | POA: Diagnosis not present

## 2015-10-01 DIAGNOSIS — J449 Chronic obstructive pulmonary disease, unspecified: Secondary | ICD-10-CM | POA: Diagnosis not present

## 2015-10-05 DIAGNOSIS — I2510 Atherosclerotic heart disease of native coronary artery without angina pectoris: Secondary | ICD-10-CM | POA: Diagnosis not present

## 2015-10-05 DIAGNOSIS — I4891 Unspecified atrial fibrillation: Secondary | ICD-10-CM | POA: Diagnosis not present

## 2015-10-05 DIAGNOSIS — K219 Gastro-esophageal reflux disease without esophagitis: Secondary | ICD-10-CM | POA: Diagnosis not present

## 2015-10-05 DIAGNOSIS — J449 Chronic obstructive pulmonary disease, unspecified: Secondary | ICD-10-CM | POA: Diagnosis not present

## 2015-10-15 DIAGNOSIS — K219 Gastro-esophageal reflux disease without esophagitis: Secondary | ICD-10-CM | POA: Diagnosis not present

## 2015-10-15 DIAGNOSIS — J449 Chronic obstructive pulmonary disease, unspecified: Secondary | ICD-10-CM | POA: Diagnosis not present

## 2015-10-15 DIAGNOSIS — R69 Illness, unspecified: Secondary | ICD-10-CM | POA: Diagnosis not present

## 2015-10-15 DIAGNOSIS — I2510 Atherosclerotic heart disease of native coronary artery without angina pectoris: Secondary | ICD-10-CM | POA: Diagnosis not present

## 2015-11-02 DIAGNOSIS — Z961 Presence of intraocular lens: Secondary | ICD-10-CM | POA: Diagnosis not present

## 2015-11-02 DIAGNOSIS — H3322 Serous retinal detachment, left eye: Secondary | ICD-10-CM | POA: Diagnosis not present

## 2015-11-02 DIAGNOSIS — H43813 Vitreous degeneration, bilateral: Secondary | ICD-10-CM | POA: Diagnosis not present

## 2015-11-02 DIAGNOSIS — H353233 Exudative age-related macular degeneration, bilateral, with inactive scar: Secondary | ICD-10-CM | POA: Diagnosis not present

## 2015-11-03 DIAGNOSIS — D508 Other iron deficiency anemias: Secondary | ICD-10-CM | POA: Diagnosis not present

## 2015-11-03 DIAGNOSIS — J449 Chronic obstructive pulmonary disease, unspecified: Secondary | ICD-10-CM | POA: Diagnosis not present

## 2015-11-03 DIAGNOSIS — R69 Illness, unspecified: Secondary | ICD-10-CM | POA: Diagnosis not present

## 2015-11-03 DIAGNOSIS — K219 Gastro-esophageal reflux disease without esophagitis: Secondary | ICD-10-CM | POA: Diagnosis not present

## 2015-11-25 DIAGNOSIS — I10 Essential (primary) hypertension: Secondary | ICD-10-CM | POA: Diagnosis not present

## 2015-11-25 DIAGNOSIS — R69 Illness, unspecified: Secondary | ICD-10-CM | POA: Diagnosis not present

## 2015-11-25 DIAGNOSIS — D508 Other iron deficiency anemias: Secondary | ICD-10-CM | POA: Diagnosis not present

## 2015-11-25 DIAGNOSIS — M150 Primary generalized (osteo)arthritis: Secondary | ICD-10-CM | POA: Diagnosis not present

## 2015-12-18 ENCOUNTER — Other Ambulatory Visit (HOSPITAL_BASED_OUTPATIENT_CLINIC_OR_DEPARTMENT_OTHER): Payer: Medicare HMO

## 2015-12-18 ENCOUNTER — Ambulatory Visit (HOSPITAL_BASED_OUTPATIENT_CLINIC_OR_DEPARTMENT_OTHER): Payer: Medicare HMO | Admitting: Family

## 2015-12-18 ENCOUNTER — Ambulatory Visit: Payer: Medicare HMO

## 2015-12-18 VITALS — BP 96/48 | HR 70 | Temp 98.2°F | Wt 113.1 lb

## 2015-12-18 DIAGNOSIS — D508 Other iron deficiency anemias: Secondary | ICD-10-CM

## 2015-12-18 DIAGNOSIS — D631 Anemia in chronic kidney disease: Secondary | ICD-10-CM | POA: Diagnosis not present

## 2015-12-18 DIAGNOSIS — D509 Iron deficiency anemia, unspecified: Secondary | ICD-10-CM

## 2015-12-18 DIAGNOSIS — N183 Chronic kidney disease, stage 3 unspecified: Secondary | ICD-10-CM

## 2015-12-18 DIAGNOSIS — R634 Abnormal weight loss: Secondary | ICD-10-CM | POA: Diagnosis not present

## 2015-12-18 DIAGNOSIS — F0390 Unspecified dementia without behavioral disturbance: Secondary | ICD-10-CM

## 2015-12-18 LAB — CBC WITH DIFFERENTIAL (CANCER CENTER ONLY)
BASO#: 0 10*3/uL (ref 0.0–0.2)
BASO%: 0.4 % (ref 0.0–2.0)
EOS%: 5.3 % (ref 0.0–7.0)
Eosinophils Absolute: 0.5 10*3/uL (ref 0.0–0.5)
HCT: 40.2 % (ref 34.8–46.6)
HEMOGLOBIN: 12.8 g/dL (ref 11.6–15.9)
LYMPH#: 1.9 10*3/uL (ref 0.9–3.3)
LYMPH%: 20.3 % (ref 14.0–48.0)
MCH: 31.5 pg (ref 26.0–34.0)
MCHC: 31.8 g/dL — AB (ref 32.0–36.0)
MCV: 99 fL (ref 81–101)
MONO#: 0.8 10*3/uL (ref 0.1–0.9)
MONO%: 8.4 % (ref 0.0–13.0)
NEUT%: 65.6 % (ref 39.6–80.0)
NEUTROS ABS: 6.1 10*3/uL (ref 1.5–6.5)
PLATELETS: 357 10*3/uL (ref 145–400)
RBC: 4.06 10*6/uL (ref 3.70–5.32)
RDW: 12.9 % (ref 11.1–15.7)
WBC: 9.3 10*3/uL (ref 3.9–10.0)

## 2015-12-18 NOTE — Progress Notes (Signed)
Hematology and Oncology Follow Up Visit  Ellen PettyBarbara [name] [id number] Oct 11, 1926 82 y.o. 12/18/2015   Principle Diagnosis:  1. Anemia of chronic renal insufficiency- stage 3 2. Intermittent iron deficiency anemia  Current Therapy:   1. Aranesp 300 mcg as needed for hemoglobin less than 11  2. IV iron as indicated - last received in February 2017    Interim History: Ellen [name] is here today with a care giver for a follow-up. She is doing fairly well but has lost quite a bit of weight. She is down 11 lbs since her last visit. She states that her appetite comes and goes. She is pleasantly confused and it appears that her dementia is progressing.  She has some mild SOB at times with exertion. She can walk some with assistance but typically uses a wheelchair. She is in a wheel chair today.  No lymphadenopathy found on exam. She denies any episodes of bleeding or bruising.  She does complain of acid reflux at times and since she c/o pain with it "under the breast bone," I did a breast exam. This was negative. No mass, lesion rash of lymphadenopathy found.  No fever, chills, n/v, rash, headache, chest pain, palpitations, abdominal pain or changes in bowel or bladder habits.  No swelling, tenderness, numbness or tingling in her extremities. No c/o pain at this time.   Medications:    Medication List       Accurate as of 12/18/15  3:20 PM. Always use your most recent med list.          acetaminophen 325 MG tablet Commonly known as:  TYLENOL Take 650 mg by mouth every 8 (eight) hours as needed for mild pain.   aspirin 81 MG EC tablet Take 1 tablet (81 mg total) by mouth daily with breakfast.   calcium-vitamin D 500-200 MG-UNIT tablet Commonly known as:  OSCAL WITH D Take 1 tablet by mouth 2 (two) times daily.   diltiazem 360 MG 24 hr capsule Commonly known as:  CARDIZEM CD Take 1 capsule (360 mg total) by mouth daily.   EYE VITAMINS Caps Take 1 capsule by mouth daily.     famotidine 20 MG tablet Commonly known as:  PEPCID Take 20 mg by mouth daily.   furosemide 20 MG tablet Commonly known as:  LASIX Take 20 mg by mouth daily as needed (take if gains 3lbs in 1 day, short of breath, swelling.).   Menthol (Topical Analgesic) 5 % Pads Apply topically. Apply to lower back every 8 hours   metoprolol tartrate 25 MG tablet Commonly known as:  LOPRESSOR Take 1 tablet (25 mg total) by mouth 2 (two) times daily.   mirtazapine 7.5 MG tablet Commonly known as:  REMERON Take 7.5 mg by mouth at bedtime.   multivitamin tablet Take 1 tablet by mouth daily.   NAMENDA XR 28 MG Cp24 24 hr capsule Generic drug:  memantine Take 28 mg by mouth daily.   NITROSTAT 0.4 MG SL tablet Generic drug:  nitroGLYCERIN Place 0.4 mg under the tongue every 5 (five) minutes as needed for chest pain. Use as directed as needed   Olopatadine HCl 0.2 % Soln Apply 1 drop to eye every morning.   potassium chloride 10 MEQ tablet Commonly known as:  K-DUR Take 20 mEq by mouth daily as needed (Take with Lasix).   QUEtiapine 25 MG tablet Commonly known as:  SEROQUEL   rivastigmine 3 MG capsule Commonly known as:  EXELON Take 3 mg by mouth.  senna-docusate 8.6-50 MG tablet Commonly known as:  Senokot-S Take 1 tablet by mouth at bedtime.   sertraline 100 MG tablet Commonly known as:  ZOLOFT Take 100 mg by mouth daily.   triamcinolone cream 0.1 % Commonly known as:  KENALOG Apply 1 application topically 2 (two) times daily as needed (To arm for itching).       Allergies:  Allergies  Allergen Reactions  . Doxycycline Other (See Comments)    Destroyed good bacteria in GI tract  . Clarithromycin Other (See Comments)    H/A .[name]..patient unsure of reaction    . Prinivil [Lisinopril] Other (See Comments)    Patient unsure of reaction  . Propulsid [Cisapride] Other (See Comments)    Patient unsure of reaction    Past Medical History, Surgical history, Social history,  and Family History were reviewed and updated.  Review of Systems: All other 10 point review of systems is negative.   Physical Exam:  vitals were not taken for this visit.  Wt Readings from Last 3 Encounters:  08/11/15 124 lb (56.2 kg)  06/26/15 132 lb 3.2 oz (60 kg)  05/11/15 131 lb (59.4 kg)    Ocular: Sclerae unicteric, pupils equal, round and reactive to light Ear-nose-throat: Oropharynx clear, dentition fair Lymphatic: No cervical supraclavicular or axillary adenopathy Lungs no rales or rhonchi, good excursion bilaterally Heart regular rate and rhythm, no murmur appreciated Abd soft, nontender, positive bowel sounds, no liver or spleen tip palpated on exam MSK no focal spinal tenderness, no joint edema Neuro: non-focal, well-oriented, appropriate affect Breasts: No mass lesion rash or lymphadenopathy found on exam  Lab Results  Component Value Date   WBC 9.3 12/18/2015   HGB 12.8 12/18/2015   HCT 40.2 12/18/2015   MCV 99 12/18/2015   PLT 357 12/18/2015   Lab Results  Component Value Date   FERRITIN 1,987 (H) 08/11/2015   IRON 54 08/11/2015   TIBC 216 (L) 08/11/2015   UIBC 163 08/11/2015   IRONPCTSAT 25 08/11/2015   Lab Results  Component Value Date   RETICCTPCT 1.1 09/30/2014   RBC 4.06 12/18/2015   RETICCTABS 61.3 06/30/2013   No results found for: KPAFRELGTCHN, LAMBDASER, KAPLAMBRATIO No results found for: IGGSERUM, IGA, IGMSERUM No results found for: Marda Stalker, SPEI   Chemistry      Component Value Date/Time   NA 139 05/11/2015 1358   K 4.3 05/11/2015 1358   CL 98 03/09/2015 1413   CO2 33 (H) 05/11/2015 1358   BUN 15.7 05/11/2015 1358   CREATININE 1.0 05/11/2015 1358      Component Value Date/Time   CALCIUM 9.1 05/11/2015 1358   ALKPHOS 106 05/11/2015 1358   AST 21 05/11/2015 1358   ALT 12 05/11/2015 1358   BILITOT 0.35 05/11/2015 1358     Impression and Plan: Ellen [name] is an 82 yo  white female with both iron deficiency anemia and anemia of chronic renal failure stage III. She is doing fairly well but continues to lose weight and it seems that her dementia is progressing. She is pleasantly confused at this time.  Hgb is stable at 12.8 with an MCV of 99. No Aransep needed at this time.  We will see what her iron studies show and bring her in later this week for an infusion if needed.  We will go ahead and plan to see her back again in 4 months for repeat labs and follow-up.  Both her family and living  facility know to contact our office with any questions or concerns. We can certainly see her sooner if need be.   Verdie MosherINCINNATI,SARAH M, NP 10/30/20173:20 PM

## 2015-12-19 LAB — IRON AND TIBC
%SAT: 19 % — AB (ref 21–57)
Iron: 46 ug/dL (ref 41–142)
TIBC: 239 ug/dL (ref 236–444)
UIBC: 192 ug/dL (ref 120–384)

## 2015-12-19 LAB — FERRITIN: Ferritin: 1822 ng/ml — ABNORMAL HIGH (ref 9–269)

## 2015-12-19 LAB — RETICULOCYTES: RETICULOCYTE COUNT: 1.4 % (ref 0.6–2.6)

## 2016-01-02 DIAGNOSIS — M150 Primary generalized (osteo)arthritis: Secondary | ICD-10-CM | POA: Diagnosis not present

## 2016-01-02 DIAGNOSIS — I10 Essential (primary) hypertension: Secondary | ICD-10-CM | POA: Diagnosis not present

## 2016-01-02 DIAGNOSIS — I4891 Unspecified atrial fibrillation: Secondary | ICD-10-CM | POA: Diagnosis not present

## 2016-01-02 DIAGNOSIS — D508 Other iron deficiency anemias: Secondary | ICD-10-CM | POA: Diagnosis not present

## 2016-01-03 DIAGNOSIS — R627 Adult failure to thrive: Secondary | ICD-10-CM | POA: Diagnosis not present

## 2016-01-03 DIAGNOSIS — R69 Illness, unspecified: Secondary | ICD-10-CM | POA: Diagnosis not present

## 2016-01-03 DIAGNOSIS — I4891 Unspecified atrial fibrillation: Secondary | ICD-10-CM | POA: Diagnosis not present

## 2016-01-03 DIAGNOSIS — G4700 Insomnia, unspecified: Secondary | ICD-10-CM | POA: Diagnosis not present

## 2016-02-01 DIAGNOSIS — I4891 Unspecified atrial fibrillation: Secondary | ICD-10-CM | POA: Diagnosis not present

## 2016-02-01 DIAGNOSIS — I10 Essential (primary) hypertension: Secondary | ICD-10-CM | POA: Diagnosis not present

## 2016-02-01 DIAGNOSIS — R69 Illness, unspecified: Secondary | ICD-10-CM | POA: Diagnosis not present

## 2016-02-01 DIAGNOSIS — M150 Primary generalized (osteo)arthritis: Secondary | ICD-10-CM | POA: Diagnosis not present

## 2016-02-02 DIAGNOSIS — D508 Other iron deficiency anemias: Secondary | ICD-10-CM | POA: Diagnosis not present

## 2016-02-02 DIAGNOSIS — J449 Chronic obstructive pulmonary disease, unspecified: Secondary | ICD-10-CM | POA: Diagnosis not present

## 2016-02-02 DIAGNOSIS — K219 Gastro-esophageal reflux disease without esophagitis: Secondary | ICD-10-CM | POA: Diagnosis not present

## 2016-02-02 DIAGNOSIS — I4891 Unspecified atrial fibrillation: Secondary | ICD-10-CM | POA: Diagnosis not present

## 2016-02-20 DIAGNOSIS — F329 Major depressive disorder, single episode, unspecified: Secondary | ICD-10-CM | POA: Diagnosis not present

## 2016-02-20 DIAGNOSIS — F419 Anxiety disorder, unspecified: Secondary | ICD-10-CM | POA: Diagnosis not present

## 2016-02-20 DIAGNOSIS — R69 Illness, unspecified: Secondary | ICD-10-CM | POA: Diagnosis not present

## 2016-02-20 DIAGNOSIS — F0391 Unspecified dementia with behavioral disturbance: Secondary | ICD-10-CM | POA: Diagnosis not present

## 2016-02-29 DIAGNOSIS — M150 Primary generalized (osteo)arthritis: Secondary | ICD-10-CM | POA: Diagnosis not present

## 2016-02-29 DIAGNOSIS — I4891 Unspecified atrial fibrillation: Secondary | ICD-10-CM | POA: Diagnosis not present

## 2016-02-29 DIAGNOSIS — I10 Essential (primary) hypertension: Secondary | ICD-10-CM | POA: Diagnosis not present

## 2016-02-29 DIAGNOSIS — R69 Illness, unspecified: Secondary | ICD-10-CM | POA: Diagnosis not present

## 2016-03-01 DIAGNOSIS — I2510 Atherosclerotic heart disease of native coronary artery without angina pectoris: Secondary | ICD-10-CM | POA: Diagnosis not present

## 2016-03-01 DIAGNOSIS — R69 Illness, unspecified: Secondary | ICD-10-CM | POA: Diagnosis not present

## 2016-03-01 DIAGNOSIS — K219 Gastro-esophageal reflux disease without esophagitis: Secondary | ICD-10-CM | POA: Diagnosis not present

## 2016-03-01 DIAGNOSIS — J449 Chronic obstructive pulmonary disease, unspecified: Secondary | ICD-10-CM | POA: Diagnosis not present

## 2016-03-08 DIAGNOSIS — J449 Chronic obstructive pulmonary disease, unspecified: Secondary | ICD-10-CM | POA: Diagnosis not present

## 2016-03-08 DIAGNOSIS — J208 Acute bronchitis due to other specified organisms: Secondary | ICD-10-CM | POA: Diagnosis not present

## 2016-03-10 DIAGNOSIS — R918 Other nonspecific abnormal finding of lung field: Secondary | ICD-10-CM | POA: Diagnosis not present

## 2016-03-10 DIAGNOSIS — R0902 Hypoxemia: Secondary | ICD-10-CM | POA: Diagnosis not present

## 2016-03-26 DIAGNOSIS — R69 Illness, unspecified: Secondary | ICD-10-CM | POA: Diagnosis not present

## 2016-03-26 DIAGNOSIS — F419 Anxiety disorder, unspecified: Secondary | ICD-10-CM | POA: Diagnosis not present

## 2016-03-26 DIAGNOSIS — F329 Major depressive disorder, single episode, unspecified: Secondary | ICD-10-CM | POA: Diagnosis not present

## 2016-03-26 DIAGNOSIS — F0391 Unspecified dementia with behavioral disturbance: Secondary | ICD-10-CM | POA: Diagnosis not present

## 2016-03-27 DIAGNOSIS — Z79899 Other long term (current) drug therapy: Secondary | ICD-10-CM | POA: Diagnosis not present

## 2016-03-29 DIAGNOSIS — M150 Primary generalized (osteo)arthritis: Secondary | ICD-10-CM | POA: Diagnosis not present

## 2016-03-29 DIAGNOSIS — I10 Essential (primary) hypertension: Secondary | ICD-10-CM | POA: Diagnosis not present

## 2016-03-29 DIAGNOSIS — D508 Other iron deficiency anemias: Secondary | ICD-10-CM | POA: Diagnosis not present

## 2016-03-29 DIAGNOSIS — I4891 Unspecified atrial fibrillation: Secondary | ICD-10-CM | POA: Diagnosis not present

## 2016-04-05 DIAGNOSIS — J189 Pneumonia, unspecified organism: Secondary | ICD-10-CM | POA: Diagnosis not present

## 2016-04-05 DIAGNOSIS — I10 Essential (primary) hypertension: Secondary | ICD-10-CM | POA: Diagnosis not present

## 2016-04-05 DIAGNOSIS — J449 Chronic obstructive pulmonary disease, unspecified: Secondary | ICD-10-CM | POA: Diagnosis not present

## 2016-04-05 DIAGNOSIS — R69 Illness, unspecified: Secondary | ICD-10-CM | POA: Diagnosis not present

## 2016-04-15 ENCOUNTER — Ambulatory Visit: Payer: Medicare HMO | Admitting: Hematology & Oncology

## 2016-04-15 ENCOUNTER — Other Ambulatory Visit: Payer: Medicare HMO

## 2016-04-18 DIAGNOSIS — I4891 Unspecified atrial fibrillation: Secondary | ICD-10-CM | POA: Diagnosis not present

## 2016-04-18 DIAGNOSIS — I10 Essential (primary) hypertension: Secondary | ICD-10-CM | POA: Diagnosis not present

## 2016-04-18 DIAGNOSIS — R69 Illness, unspecified: Secondary | ICD-10-CM | POA: Diagnosis not present

## 2016-04-18 DIAGNOSIS — M150 Primary generalized (osteo)arthritis: Secondary | ICD-10-CM | POA: Diagnosis not present

## 2016-04-30 DIAGNOSIS — F0391 Unspecified dementia with behavioral disturbance: Secondary | ICD-10-CM | POA: Diagnosis not present

## 2016-04-30 DIAGNOSIS — F329 Major depressive disorder, single episode, unspecified: Secondary | ICD-10-CM | POA: Diagnosis not present

## 2016-04-30 DIAGNOSIS — F419 Anxiety disorder, unspecified: Secondary | ICD-10-CM | POA: Diagnosis not present

## 2016-04-30 DIAGNOSIS — R69 Illness, unspecified: Secondary | ICD-10-CM | POA: Diagnosis not present

## 2016-05-13 ENCOUNTER — Other Ambulatory Visit (HOSPITAL_BASED_OUTPATIENT_CLINIC_OR_DEPARTMENT_OTHER): Payer: Medicare HMO

## 2016-05-13 ENCOUNTER — Ambulatory Visit (HOSPITAL_BASED_OUTPATIENT_CLINIC_OR_DEPARTMENT_OTHER): Payer: Medicare HMO | Admitting: Hematology & Oncology

## 2016-05-13 VITALS — BP 135/77 | HR 90 | Temp 98.2°F | Resp 17 | Wt 110.0 lb

## 2016-05-13 DIAGNOSIS — N183 Chronic kidney disease, stage 3 unspecified: Secondary | ICD-10-CM

## 2016-05-13 DIAGNOSIS — N189 Chronic kidney disease, unspecified: Secondary | ICD-10-CM

## 2016-05-13 DIAGNOSIS — D631 Anemia in chronic kidney disease: Secondary | ICD-10-CM

## 2016-05-13 DIAGNOSIS — D509 Iron deficiency anemia, unspecified: Secondary | ICD-10-CM | POA: Diagnosis not present

## 2016-05-13 DIAGNOSIS — I509 Heart failure, unspecified: Secondary | ICD-10-CM | POA: Diagnosis not present

## 2016-05-13 DIAGNOSIS — R634 Abnormal weight loss: Secondary | ICD-10-CM

## 2016-05-13 DIAGNOSIS — F0390 Unspecified dementia without behavioral disturbance: Secondary | ICD-10-CM | POA: Diagnosis not present

## 2016-05-13 DIAGNOSIS — R69 Illness, unspecified: Secondary | ICD-10-CM | POA: Diagnosis not present

## 2016-05-13 DIAGNOSIS — D508 Other iron deficiency anemias: Secondary | ICD-10-CM | POA: Diagnosis not present

## 2016-05-13 LAB — FERRITIN

## 2016-05-13 LAB — CBC WITH DIFFERENTIAL (CANCER CENTER ONLY)
BASO#: 0.1 10*3/uL (ref 0.0–0.2)
BASO%: 1 % (ref 0.0–2.0)
EOS%: 5.4 % (ref 0.0–7.0)
Eosinophils Absolute: 0.3 10*3/uL (ref 0.0–0.5)
HEMATOCRIT: 42.2 % (ref 34.8–46.6)
HEMOGLOBIN: 13.5 g/dL (ref 11.6–15.9)
LYMPH#: 1.5 10*3/uL (ref 0.9–3.3)
LYMPH%: 23.9 % (ref 14.0–48.0)
MCH: 31.3 pg (ref 26.0–34.0)
MCHC: 32 g/dL (ref 32.0–36.0)
MCV: 98 fL (ref 81–101)
MONO#: 0.5 10*3/uL (ref 0.1–0.9)
MONO%: 8 % (ref 0.0–13.0)
NEUT%: 61.7 % (ref 39.6–80.0)
NEUTROS ABS: 3.9 10*3/uL (ref 1.5–6.5)
Platelets: 288 10*3/uL (ref 145–400)
RBC: 4.31 10*6/uL (ref 3.70–5.32)
RDW: 12.9 % (ref 11.1–15.7)
WBC: 6.3 10*3/uL (ref 3.9–10.0)

## 2016-05-13 LAB — IRON AND TIBC
%SAT: 30 % (ref 21–57)
Iron: 76 ug/dL (ref 41–142)
TIBC: 256 ug/dL (ref 236–444)
UIBC: 180 ug/dL (ref 120–384)

## 2016-05-13 NOTE — Progress Notes (Signed)
Hematology and Oncology Follow Up Visit  Barbette Mcglaun [id number] 06-08-1926 83 y.o. 05/13/2016   Principle Diagnosis:  . Anemia of renal insufficiency. 2. Intermittent iron-deficiency anemia. 3. Chronic congestive heart failure.  Current Therapy:   1. Aranesp 300 mcg as needed for hemoglobin less than 11. 2. IV iron as indicated.     Interim History:  Ms.  Gressman is back for followup. She is in a nursing home. She seems be doing pretty well. Her weight is still going down. There is some concern about her not eating all that much. She just says that she is not hungry.   She does look a little more frail.  We last saw her back in October, her ferritin was 1822. Her ferritin is an acute phase reactant. This is reflective of other issues. Her iron saturation was only 19%.  She has not gotten iron probably for about a year.  Thankfully, it does not look like she has had issues with heart failure.  She is not hurting.  She has not had any obvious infections or flu over the wintertime.  She has very bad dementia. However, this probably is a blessing in many ways.  Currently, her performance status was ECOG 3  Medications:  Current Outpatient Prescriptions:  .  acetaminophen (TYLENOL) 325 MG tablet, Take 650 mg by mouth every 8 (eight) hours as needed for mild pain., Disp: , Rfl:  .  furosemide (LASIX) 40 MG tablet, Take by mouth., Disp: , Rfl:  .  losartan (COZAAR) 50 MG tablet, Take by mouth., Disp: , Rfl:  .  nadolol (CORGARD) 80 MG tablet, Take by mouth., Disp: , Rfl:  .  nitroGLYCERIN (NITROSTAT) 0.4 MG SL tablet, Place 1 tablet under tongue as needed for chest pain as needed, Disp: , Rfl:  .  pravastatin (PRAVACHOL) 80 MG tablet, Take by mouth., Disp: , Rfl:  .  traMADol (ULTRAM) 50 MG tablet, Take by mouth., Disp: , Rfl:  .  diltiazem (CARDIZEM CD) 360 MG 24 hr capsule, Take 1 capsule (360 mg total) by mouth daily., Disp: , Rfl:  .  famotidine (PEPCID) 20 MG tablet, Take 20 mg  by mouth daily., Disp: , Rfl:  .  furosemide (LASIX) 20 MG tablet, Take 20 mg by mouth daily as needed (take if gains 3lbs in 1 day, short of breath, swelling.)., Disp: , Rfl:  .  ipratropium-albuterol (DUONEB) 0.5-2.5 (3) MG/3ML SOLN, , Disp: , Rfl:  .  memantine (NAMENDA XR) 28 MG CP24 24 hr capsule, Take 28 mg by mouth daily., Disp: , Rfl:  .  Menthol, Topical Analgesic, 5 % PADS, Apply topically. Apply to lower back every 8 hours, Disp: , Rfl:  .  metoprolol tartrate (LOPRESSOR) 25 MG tablet, Take 1 tablet (25 mg total) by mouth 2 (two) times daily., Disp: 60 tablet, Rfl: 6 .  mirtazapine (REMERON) 7.5 MG tablet, Take 7.5 mg by mouth at bedtime., Disp: , Rfl:  .  Multiple Vitamin (MULTIVITAMIN) tablet, Take 1 tablet by mouth daily., Disp: , Rfl:  .  Multiple Vitamins-Minerals (EYE VITAMINS) CAPS, Take 1 capsule by mouth daily. , Disp: , Rfl:  .  NITROSTAT 0.4 MG SL tablet, Place 0.4 mg under the tongue every 5 (five) minutes as needed for chest pain. Use as directed as needed, Disp: , Rfl:  .  Olopatadine HCl 0.2 % SOLN, Apply 1 drop to eye every morning., Disp: , Rfl:  .  oseltamivir (TAMIFLU) 75 MG capsule, , Disp: , Rfl:  .  potassium chloride (K-DUR) 10 MEQ tablet, Take 20 mEq by mouth daily as needed (Take with Lasix)., Disp: , Rfl:  .  QUEtiapine (SEROQUEL) 25 MG tablet, , Disp: , Rfl:  .  rivastigmine (EXELON) 6 MG capsule, , Disp: , Rfl:  .  senna-docusate (SENOKOT-S) 8.6-50 MG per tablet, Take 1 tablet by mouth at bedtime., Disp: , Rfl:  .  sertraline (ZOLOFT) 100 MG tablet, Take 100 mg by mouth daily., Disp: , Rfl:  .  sertraline (ZOLOFT) 50 MG tablet, , Disp: , Rfl:  .  triamcinolone cream (KENALOG) 0.1 %, Apply 1 application topically 2 (two) times daily as needed (To arm for itching)., Disp: , Rfl:   Allergies:  Allergies  Allergen Reactions  . Doxycycline Other (See Comments)    Destroyed good bacteria in GI tract  . Clarithromycin Other (See Comments)    H/A .[name]..patient  unsure of reaction    . Prinivil [Lisinopril] Other (See Comments)    Patient unsure of reaction  . Propulsid [Cisapride] Other (See Comments)    Patient unsure of reaction    Past Medical History, Surgical history, Social history, and Family History were reviewed and updated.  Review of Systems: As above  Physical Exam:  weight is 110 lb (49.9 kg). Her temperature is 98.2 F (36.8 C). Her blood pressure is 135/77 and her pulse is 90. Her respiration is 17 and oxygen saturation is 97%.   Elderly white female in no obvious distress. Head and neck exam shows no ocular or oral lesions. She has no adenopathy in the neck. Lungs are with decreased breath sounds at the bases. Cardiac exam regular rate and rhythm with an occasional extra beat. She has a 1/6 systolic murmur. Abdomen is soft. She has no fluid wave. There is no palpable liver or spleen tip. Back exam shows no tenderness over the spine, ribs or hips. Extremities shows minimal chronic nonpitting edema of the lower legs. She has some osteoarthritic changes in her joints. Skin exam shows no rashes, ecchymoses or petechia. Neurological exam is nonfocal.  Lab Results  Component Value Date   WBC 6.3 05/13/2016   HGB 13.5 05/13/2016   HCT 42.2 05/13/2016   MCV 98 05/13/2016   PLT 288 05/13/2016     Chemistry      Component Value Date/Time   NA 139 05/11/2015 1358   K 4.3 05/11/2015 1358   CL 98 03/09/2015 1413   CO2 33 (H) 05/11/2015 1358   BUN 15.7 05/11/2015 1358   CREATININE 1.0 05/11/2015 1358      Component Value Date/Time   CALCIUM 9.1 05/11/2015 1358   ALKPHOS 106 05/11/2015 1358   AST 21 05/11/2015 1358   ALT 12 05/11/2015 1358   BILITOT 0.35 05/11/2015 1358         Impression and Plan: Ms. Cavey is an 83 year old white female with multi-factorial anemia.   the weight loss is a little bit troublesome. At her age, it just might be a sign that she just is going to "slow down". Somehow, I just worried that she is  gradually declining and this weight loss indicates that her time with [place] will be minimal.  For now, I just only we have to get her back to be seen. Her blood counts look okay. They've been okay for the past 9 months. I'm really not sure how much we are adding to her medical care. I know that he can be difficult to get her to our office do not  want to make things more difficult for her or her family.  It has been a true pleasure to have been a part of Ms. Adelene AmasJames's life. She has been so kind. I have known her for several years and she has been family to me.  I will pray for her. Thankfully, she has dementia so she really does not know that there is any kind of health problem that she has.   Josph MachoENNEVER,Floyed Masoud R, MD 3/26/201812:30 PM

## 2016-05-14 LAB — ERYTHROPOIETIN: ERYTHROPOIETIN: 13.9 m[IU]/mL (ref 2.6–18.5)

## 2016-05-14 LAB — RETICULOCYTES: Reticulocyte Count: 1.1 % (ref 0.6–2.6)

## 2016-05-15 ENCOUNTER — Telehealth: Payer: Self-pay | Admitting: *Deleted

## 2016-05-15 NOTE — Telephone Encounter (Addendum)
Patient's daughter is aware of results   ----- Message from Verdie MosherSarah M [place], NP sent at 05/13/2016  4:10 PM EDT ----- Regarding: Iron  Iron studies are stable. Verlin Grillshanky ou!  Sarah  ----- Message ----- From: Interface, Lab In Three Zero One Sent: 05/13/2016  11:32 AM To: Verdie MosherSarah M [place], NP

## 2016-05-17 DIAGNOSIS — M150 Primary generalized (osteo)arthritis: Secondary | ICD-10-CM | POA: Diagnosis not present

## 2016-05-17 DIAGNOSIS — R627 Adult failure to thrive: Secondary | ICD-10-CM | POA: Diagnosis not present

## 2016-05-17 DIAGNOSIS — I10 Essential (primary) hypertension: Secondary | ICD-10-CM | POA: Diagnosis not present

## 2016-05-17 DIAGNOSIS — I4891 Unspecified atrial fibrillation: Secondary | ICD-10-CM | POA: Diagnosis not present

## 2016-05-23 DIAGNOSIS — R69 Illness, unspecified: Secondary | ICD-10-CM | POA: Diagnosis not present

## 2016-05-23 DIAGNOSIS — D508 Other iron deficiency anemias: Secondary | ICD-10-CM | POA: Diagnosis not present

## 2016-05-23 DIAGNOSIS — M150 Primary generalized (osteo)arthritis: Secondary | ICD-10-CM | POA: Diagnosis not present

## 2016-05-23 DIAGNOSIS — I10 Essential (primary) hypertension: Secondary | ICD-10-CM | POA: Diagnosis not present

## 2016-06-06 DIAGNOSIS — F419 Anxiety disorder, unspecified: Secondary | ICD-10-CM | POA: Diagnosis not present

## 2016-06-06 DIAGNOSIS — F0391 Unspecified dementia with behavioral disturbance: Secondary | ICD-10-CM | POA: Diagnosis not present

## 2016-06-06 DIAGNOSIS — F329 Major depressive disorder, single episode, unspecified: Secondary | ICD-10-CM | POA: Diagnosis not present

## 2016-06-06 DIAGNOSIS — R69 Illness, unspecified: Secondary | ICD-10-CM | POA: Diagnosis not present

## 2016-06-29 DIAGNOSIS — E46 Unspecified protein-calorie malnutrition: Secondary | ICD-10-CM | POA: Diagnosis not present

## 2016-06-29 DIAGNOSIS — I4891 Unspecified atrial fibrillation: Secondary | ICD-10-CM | POA: Diagnosis not present

## 2016-06-29 DIAGNOSIS — I10 Essential (primary) hypertension: Secondary | ICD-10-CM | POA: Diagnosis not present

## 2016-07-09 DIAGNOSIS — F329 Major depressive disorder, single episode, unspecified: Secondary | ICD-10-CM | POA: Diagnosis not present

## 2016-07-09 DIAGNOSIS — R69 Illness, unspecified: Secondary | ICD-10-CM | POA: Diagnosis not present

## 2016-07-09 DIAGNOSIS — F419 Anxiety disorder, unspecified: Secondary | ICD-10-CM | POA: Diagnosis not present

## 2016-07-09 DIAGNOSIS — F0391 Unspecified dementia with behavioral disturbance: Secondary | ICD-10-CM | POA: Diagnosis not present

## 2016-07-27 DIAGNOSIS — M150 Primary generalized (osteo)arthritis: Secondary | ICD-10-CM | POA: Diagnosis not present

## 2016-07-27 DIAGNOSIS — I4891 Unspecified atrial fibrillation: Secondary | ICD-10-CM | POA: Diagnosis not present

## 2016-07-27 DIAGNOSIS — I10 Essential (primary) hypertension: Secondary | ICD-10-CM | POA: Diagnosis not present

## 2016-08-09 DIAGNOSIS — I4891 Unspecified atrial fibrillation: Secondary | ICD-10-CM | POA: Diagnosis not present

## 2016-08-09 DIAGNOSIS — I10 Essential (primary) hypertension: Secondary | ICD-10-CM | POA: Diagnosis not present

## 2016-08-09 DIAGNOSIS — K1379 Other lesions of oral mucosa: Secondary | ICD-10-CM | POA: Diagnosis not present

## 2016-08-09 DIAGNOSIS — J449 Chronic obstructive pulmonary disease, unspecified: Secondary | ICD-10-CM | POA: Diagnosis not present

## 2016-08-13 DIAGNOSIS — F419 Anxiety disorder, unspecified: Secondary | ICD-10-CM | POA: Diagnosis not present

## 2016-08-13 DIAGNOSIS — F329 Major depressive disorder, single episode, unspecified: Secondary | ICD-10-CM | POA: Diagnosis not present

## 2016-08-13 DIAGNOSIS — R69 Illness, unspecified: Secondary | ICD-10-CM | POA: Diagnosis not present

## 2016-08-13 DIAGNOSIS — F0391 Unspecified dementia with behavioral disturbance: Secondary | ICD-10-CM | POA: Diagnosis not present

## 2016-08-31 DIAGNOSIS — E46 Unspecified protein-calorie malnutrition: Secondary | ICD-10-CM | POA: Diagnosis not present

## 2016-08-31 DIAGNOSIS — I4891 Unspecified atrial fibrillation: Secondary | ICD-10-CM | POA: Diagnosis not present

## 2016-08-31 DIAGNOSIS — I10 Essential (primary) hypertension: Secondary | ICD-10-CM | POA: Diagnosis not present

## 2016-09-18 DIAGNOSIS — Z79899 Other long term (current) drug therapy: Secondary | ICD-10-CM | POA: Diagnosis not present

## 2016-09-27 DIAGNOSIS — R69 Illness, unspecified: Secondary | ICD-10-CM | POA: Diagnosis not present

## 2016-09-27 DIAGNOSIS — I4891 Unspecified atrial fibrillation: Secondary | ICD-10-CM | POA: Diagnosis not present

## 2016-09-27 DIAGNOSIS — I10 Essential (primary) hypertension: Secondary | ICD-10-CM | POA: Diagnosis not present

## 2016-09-27 DIAGNOSIS — J449 Chronic obstructive pulmonary disease, unspecified: Secondary | ICD-10-CM | POA: Diagnosis not present

## 2016-09-28 DIAGNOSIS — M150 Primary generalized (osteo)arthritis: Secondary | ICD-10-CM | POA: Diagnosis not present

## 2016-09-28 DIAGNOSIS — I10 Essential (primary) hypertension: Secondary | ICD-10-CM | POA: Diagnosis not present

## 2016-09-28 DIAGNOSIS — R69 Illness, unspecified: Secondary | ICD-10-CM | POA: Diagnosis not present

## 2016-10-25 DIAGNOSIS — E46 Unspecified protein-calorie malnutrition: Secondary | ICD-10-CM | POA: Diagnosis not present

## 2016-10-25 DIAGNOSIS — J449 Chronic obstructive pulmonary disease, unspecified: Secondary | ICD-10-CM | POA: Diagnosis not present

## 2016-10-25 DIAGNOSIS — J189 Pneumonia, unspecified organism: Secondary | ICD-10-CM | POA: Diagnosis not present

## 2016-10-25 DIAGNOSIS — I4891 Unspecified atrial fibrillation: Secondary | ICD-10-CM | POA: Diagnosis not present

## 2016-11-03 DIAGNOSIS — I4891 Unspecified atrial fibrillation: Secondary | ICD-10-CM | POA: Diagnosis not present

## 2016-11-03 DIAGNOSIS — E46 Unspecified protein-calorie malnutrition: Secondary | ICD-10-CM | POA: Diagnosis not present

## 2016-11-03 DIAGNOSIS — I10 Essential (primary) hypertension: Secondary | ICD-10-CM | POA: Diagnosis not present

## 2016-11-03 DIAGNOSIS — E876 Hypokalemia: Secondary | ICD-10-CM | POA: Diagnosis not present

## 2016-11-23 DIAGNOSIS — M150 Primary generalized (osteo)arthritis: Secondary | ICD-10-CM | POA: Diagnosis not present

## 2016-11-23 DIAGNOSIS — I10 Essential (primary) hypertension: Secondary | ICD-10-CM | POA: Diagnosis not present

## 2016-11-23 DIAGNOSIS — E46 Unspecified protein-calorie malnutrition: Secondary | ICD-10-CM | POA: Diagnosis not present

## 2016-12-09 DIAGNOSIS — J449 Chronic obstructive pulmonary disease, unspecified: Secondary | ICD-10-CM | POA: Diagnosis not present

## 2016-12-09 DIAGNOSIS — I4891 Unspecified atrial fibrillation: Secondary | ICD-10-CM | POA: Diagnosis not present

## 2016-12-09 DIAGNOSIS — I10 Essential (primary) hypertension: Secondary | ICD-10-CM | POA: Diagnosis not present

## 2016-12-09 DIAGNOSIS — E46 Unspecified protein-calorie malnutrition: Secondary | ICD-10-CM | POA: Diagnosis not present

## 2016-12-10 DIAGNOSIS — I10 Essential (primary) hypertension: Secondary | ICD-10-CM | POA: Diagnosis not present

## 2016-12-10 DIAGNOSIS — E46 Unspecified protein-calorie malnutrition: Secondary | ICD-10-CM | POA: Diagnosis not present

## 2016-12-10 DIAGNOSIS — M150 Primary generalized (osteo)arthritis: Secondary | ICD-10-CM | POA: Diagnosis not present

## 2016-12-10 DIAGNOSIS — R69 Illness, unspecified: Secondary | ICD-10-CM | POA: Diagnosis not present

## 2017-01-17 IMAGING — DX DG THORACIC SPINE 2V
3 series · 3 of 3 positions shown · non-contrast
Comparison: 07/04/2014 and prior chest radiographs

CLINICAL DATA: Acute upper thoracic pain following fall today.
Initial encounter.

EXAM:
THORACIC SPINE 2 VIEWS

[t-spine ap]
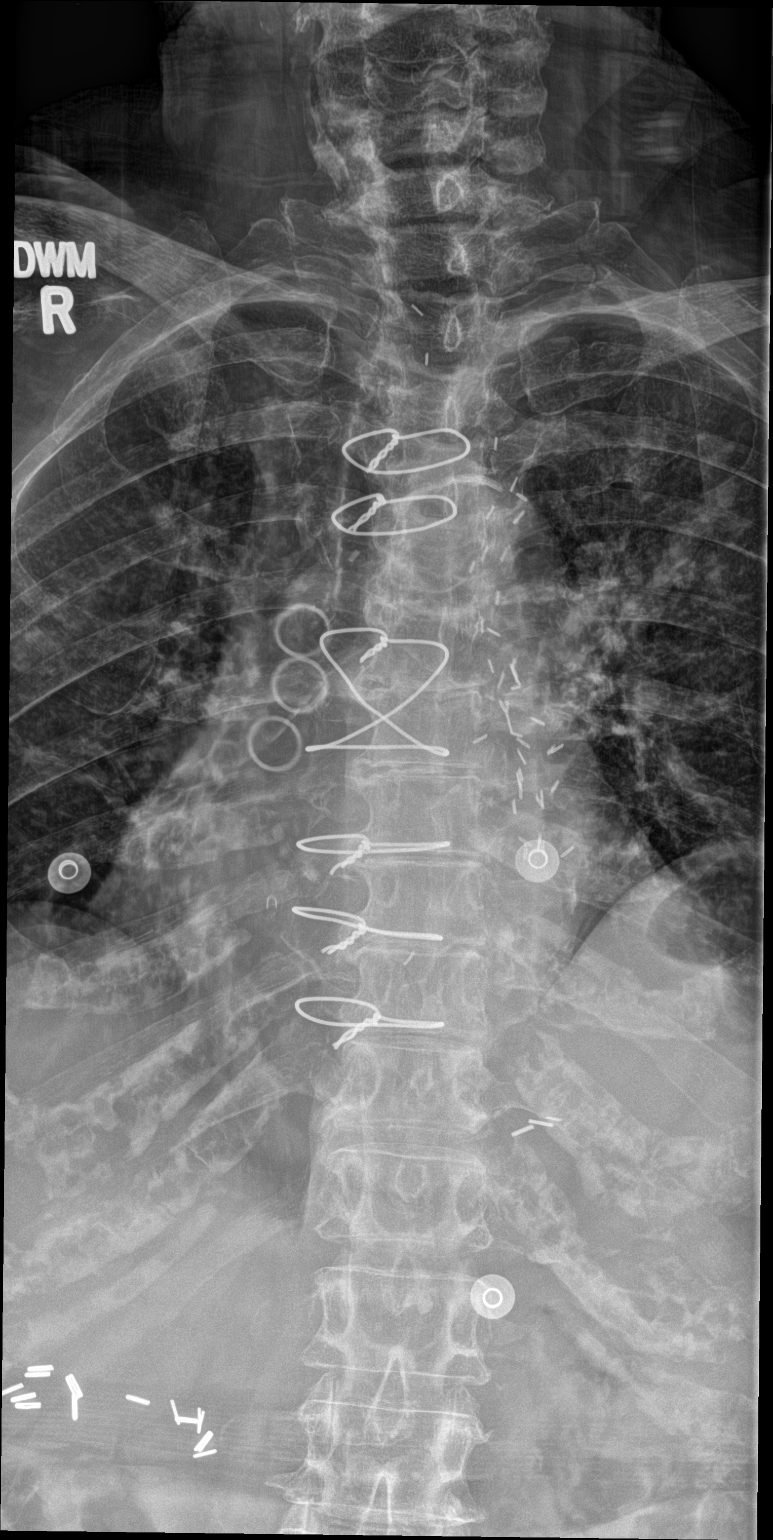

[t-spine lat (1 of 2)]
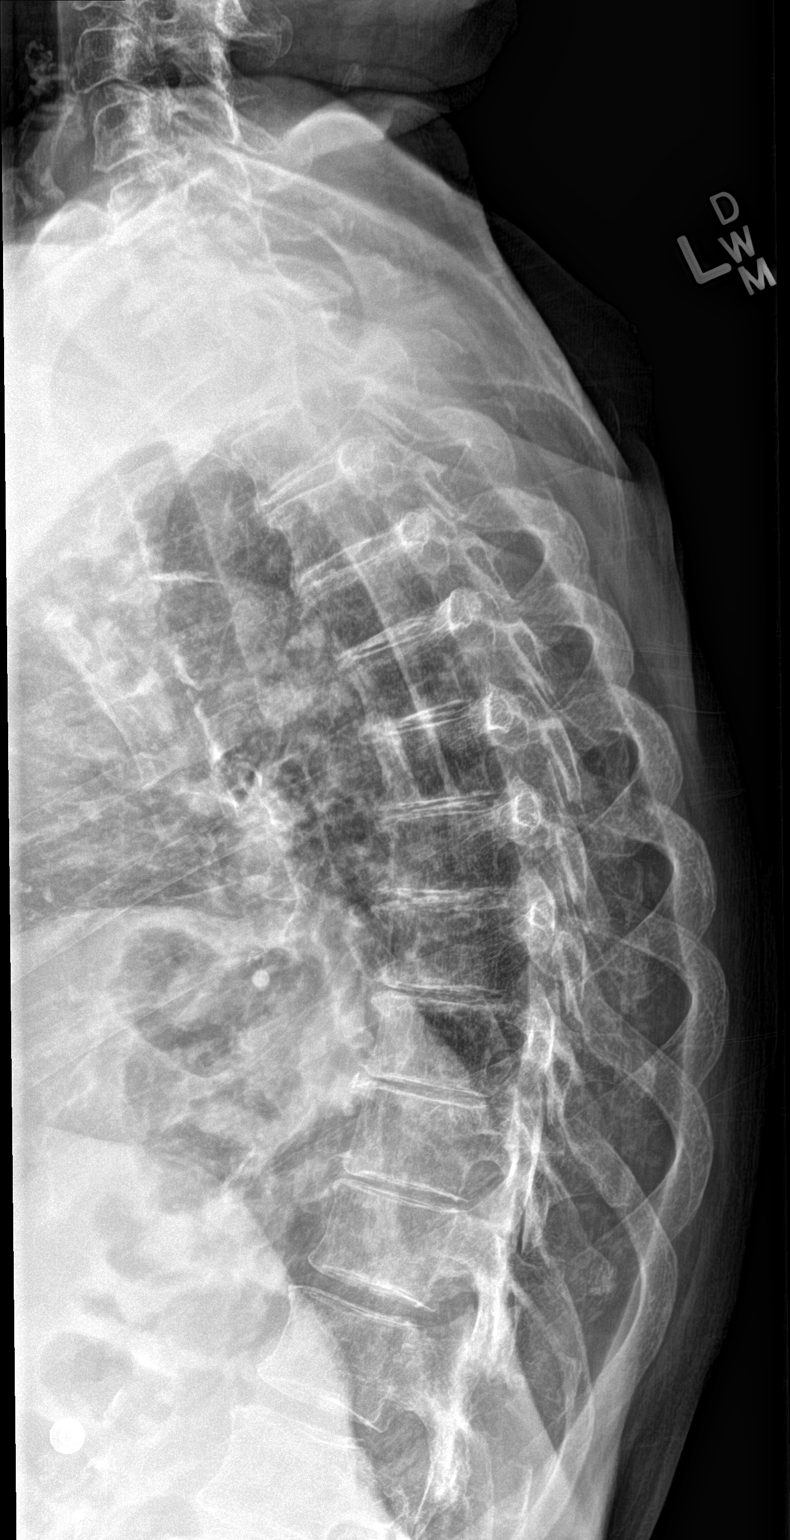

[t-spine lat (2 of 2)]
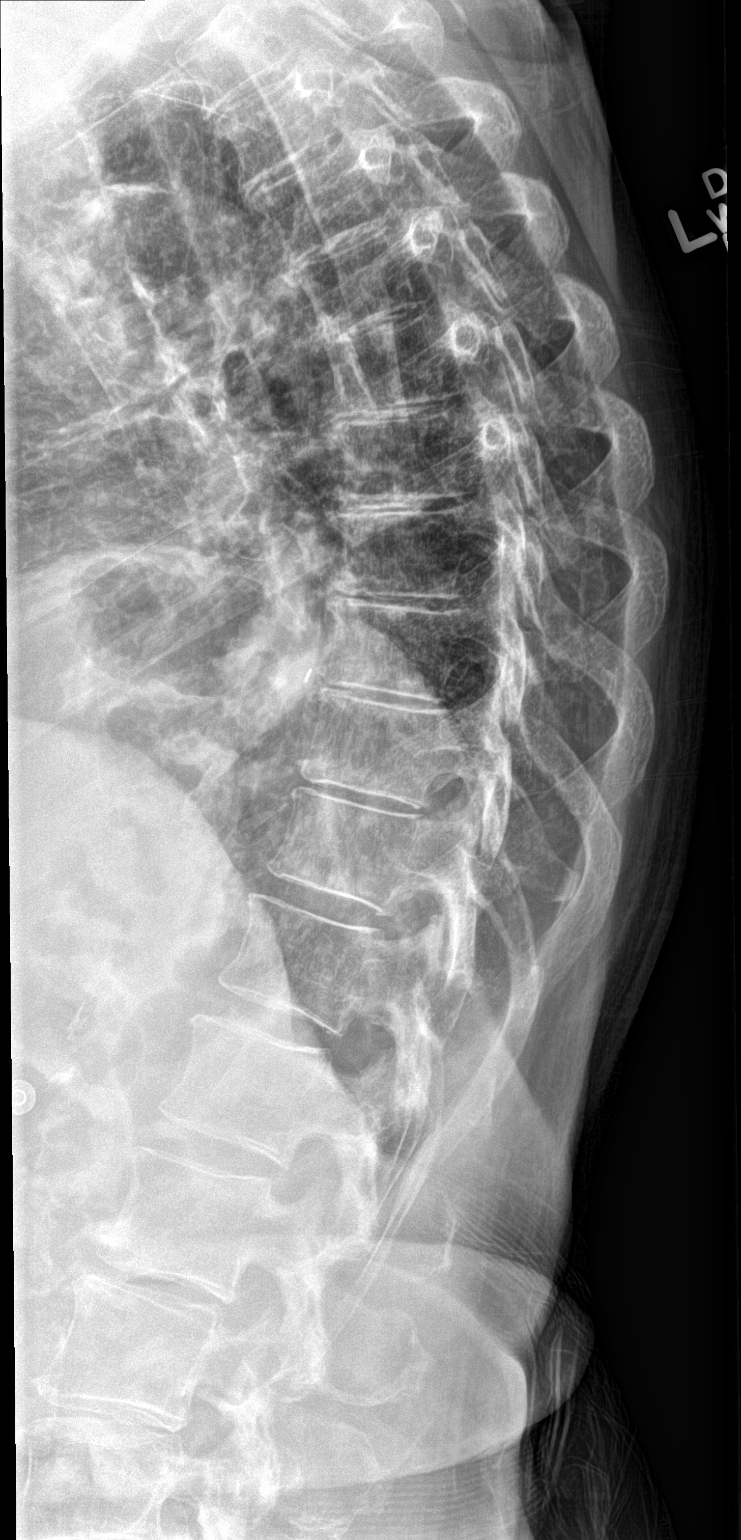

[3 of 3 positions shown; findings below may reference images not displayed]

FINDINGS: Normal alignment is noted.

There is no evidence of acute fracture or subluxation.

Mild multilevel degenerative disc disease identified.

No focal bony lesions are identified.

CABG changes are again noted.
IMPRESSION: No evidence of acute bony abnormality.

## 2017-01-24 IMAGING — DX DG HIP (WITH OR WITHOUT PELVIS) 2-3V*L*
3 series · 3 of 3 positions shown · non-contrast
Comparison: None.

CLINICAL DATA: Fell 2 days ago at the [HOSPITAL]. Left hip pain
since.

EXAM:
DG HIP (WITH OR WITHOUT PELVIS) 2-3V LEFT

[pelvis ap]
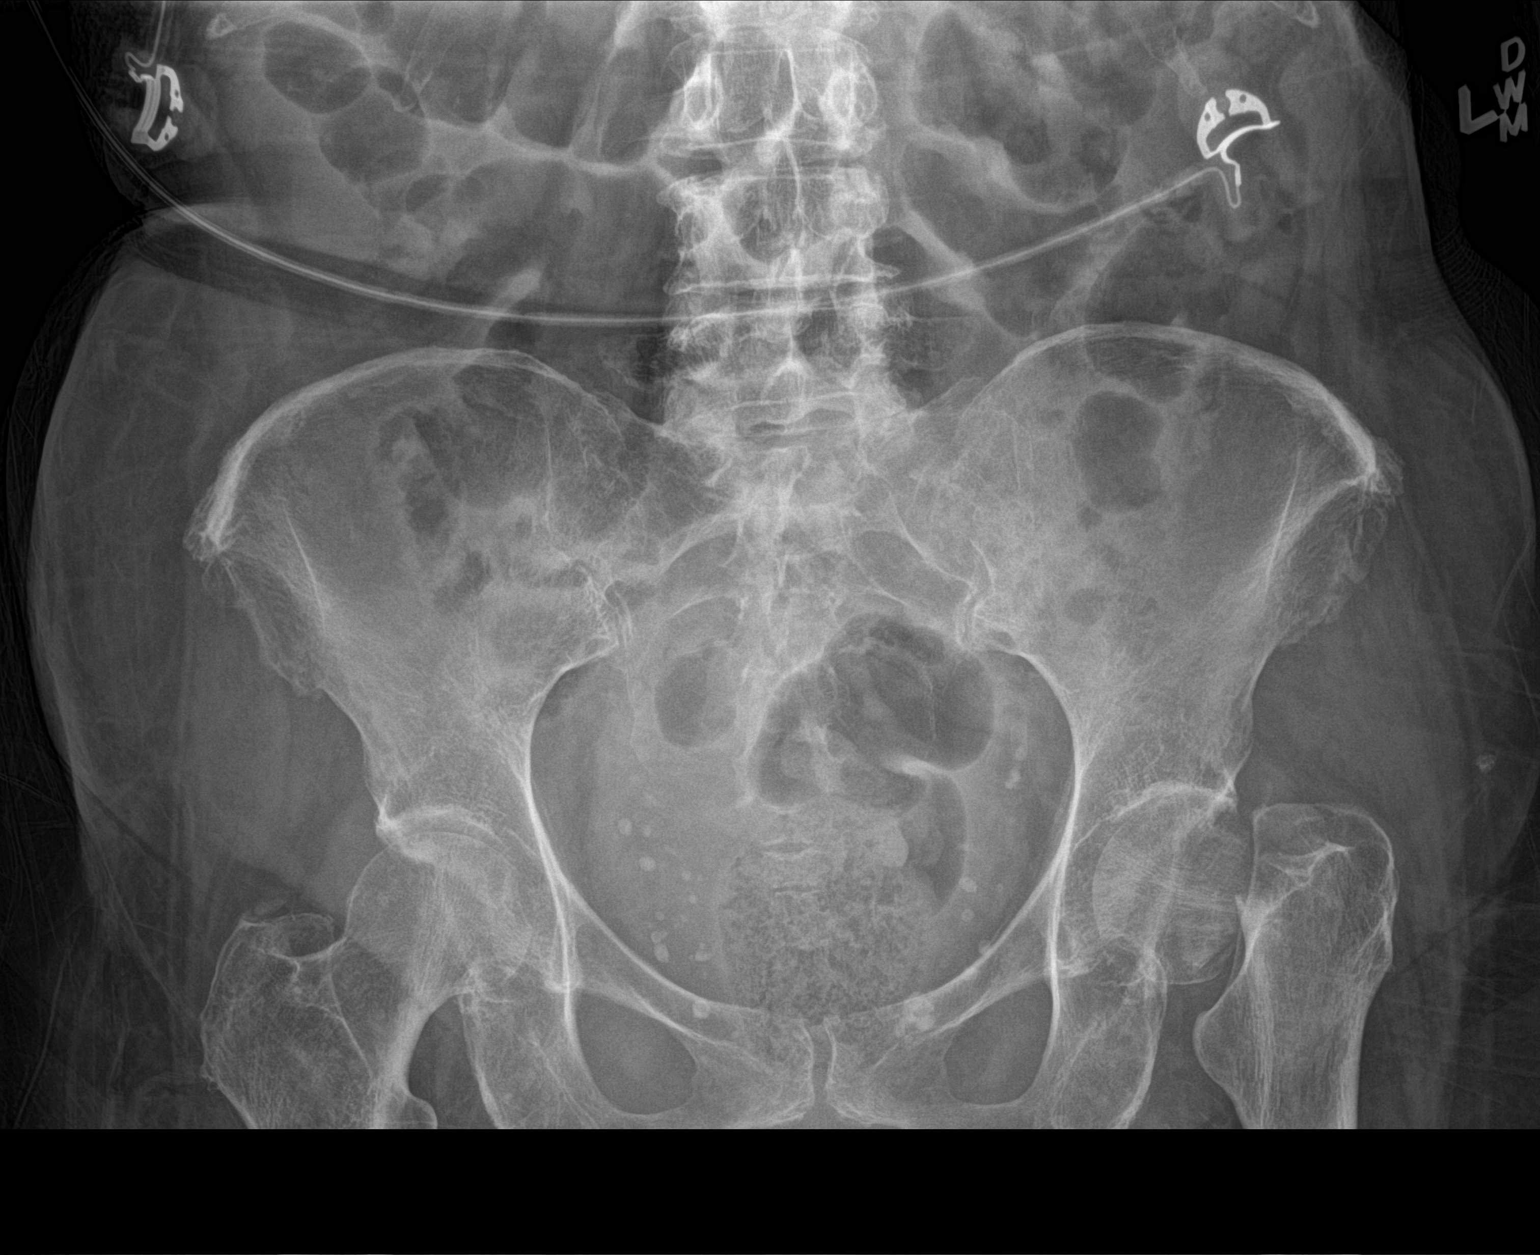

[hip ap]
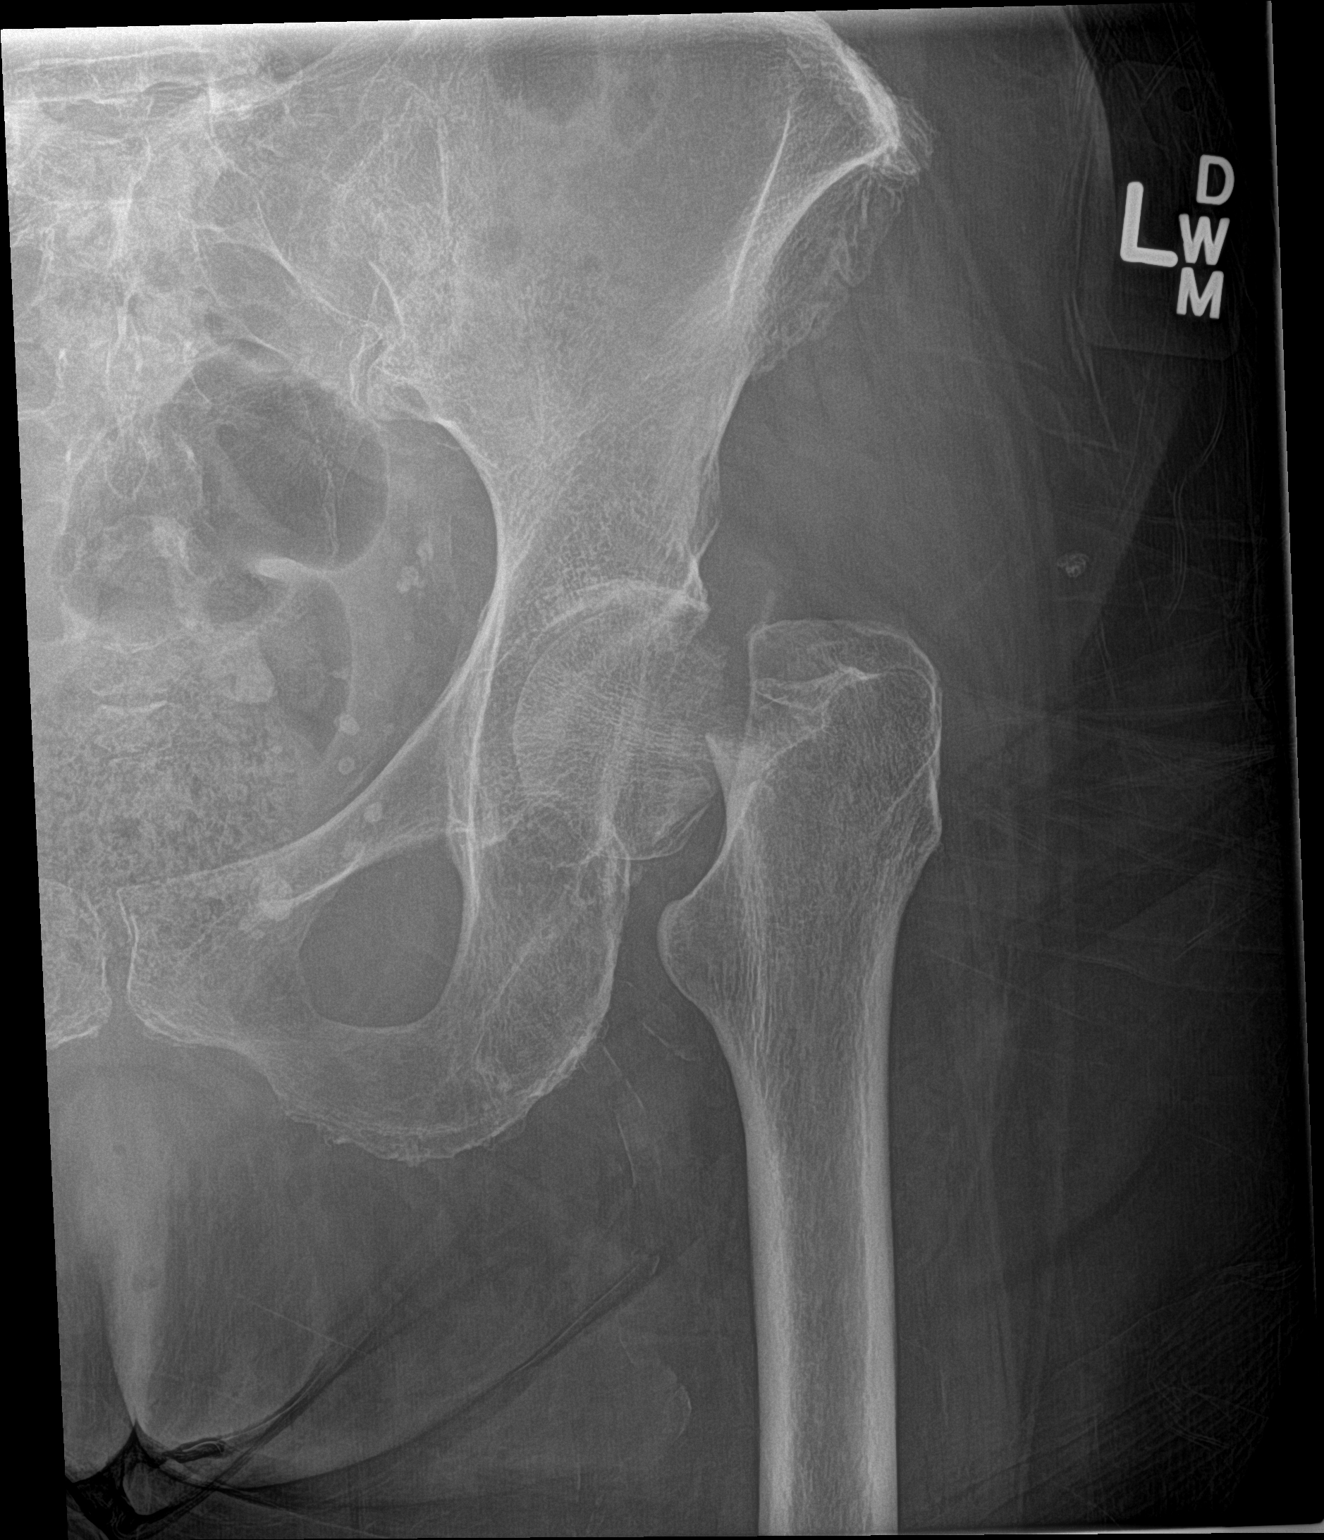

[hip lat]
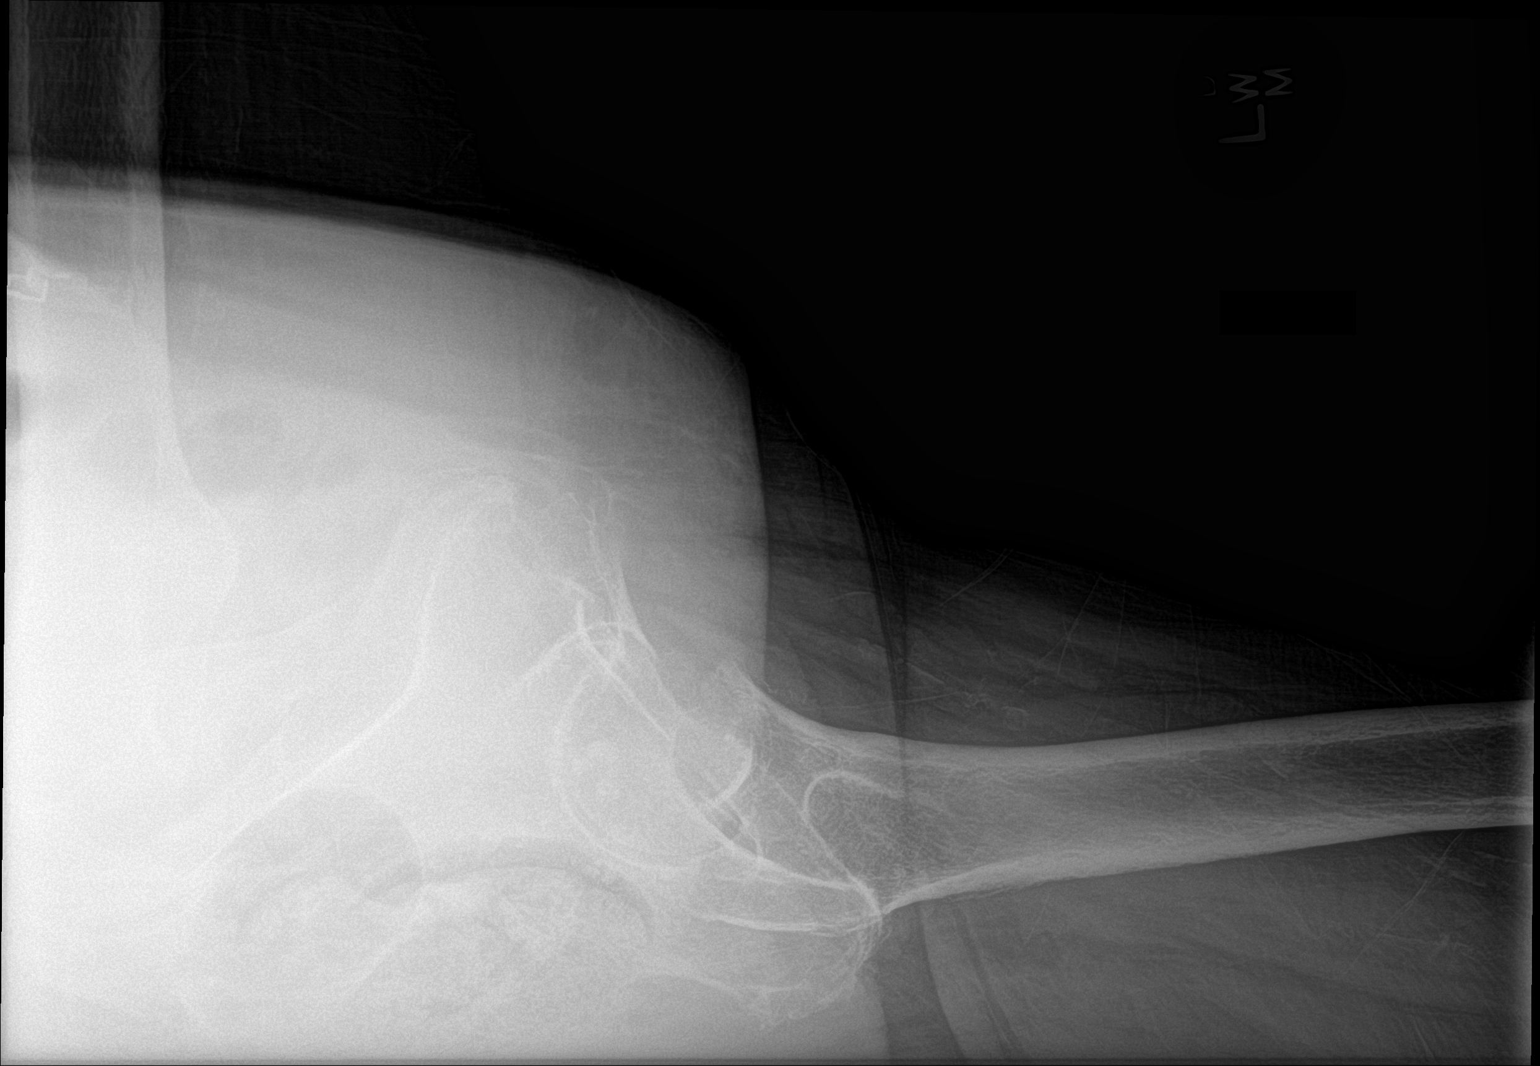

[3 of 3 positions shown; findings below may reference images not displayed]

FINDINGS: There is a displaced, non comminuted fracture of the left femoral
neck. This is in mid cervical femoral neck fracture. Shaft component
has retracted superiorly displacing 15 mm. There is also apex
anterior angulation of approximately 30 degrees as well as
significant varus angulation.

There are no other fractures. Bones are diffusely demineralized.
There is superior lateral hip joint space narrowing on the left.

Soft tissues demonstrate vascular calcifications but are otherwise
unremarkable.
IMPRESSION: 1. Displaced, non comminuted fracture of the left femoral neck with
varus and apex anterior angulation.

## 2017-01-25 DIAGNOSIS — E46 Unspecified protein-calorie malnutrition: Secondary | ICD-10-CM | POA: Diagnosis not present

## 2017-01-25 DIAGNOSIS — M150 Primary generalized (osteo)arthritis: Secondary | ICD-10-CM | POA: Diagnosis not present

## 2017-01-25 DIAGNOSIS — R69 Illness, unspecified: Secondary | ICD-10-CM | POA: Diagnosis not present

## 2017-01-25 DIAGNOSIS — I2510 Atherosclerotic heart disease of native coronary artery without angina pectoris: Secondary | ICD-10-CM | POA: Diagnosis not present

## 2017-01-30 DIAGNOSIS — F0391 Unspecified dementia with behavioral disturbance: Secondary | ICD-10-CM | POA: Diagnosis not present

## 2017-01-30 DIAGNOSIS — R69 Illness, unspecified: Secondary | ICD-10-CM | POA: Diagnosis not present

## 2017-01-30 DIAGNOSIS — G4700 Insomnia, unspecified: Secondary | ICD-10-CM | POA: Diagnosis not present

## 2017-01-30 DIAGNOSIS — R627 Adult failure to thrive: Secondary | ICD-10-CM | POA: Diagnosis not present

## 2017-02-02 DIAGNOSIS — I10 Essential (primary) hypertension: Secondary | ICD-10-CM | POA: Diagnosis not present

## 2017-02-02 DIAGNOSIS — M150 Primary generalized (osteo)arthritis: Secondary | ICD-10-CM | POA: Diagnosis not present

## 2017-02-02 DIAGNOSIS — E46 Unspecified protein-calorie malnutrition: Secondary | ICD-10-CM | POA: Diagnosis not present

## 2017-02-02 DIAGNOSIS — R69 Illness, unspecified: Secondary | ICD-10-CM | POA: Diagnosis not present

## 2017-02-12 DIAGNOSIS — G4700 Insomnia, unspecified: Secondary | ICD-10-CM | POA: Diagnosis not present

## 2017-02-12 DIAGNOSIS — R69 Illness, unspecified: Secondary | ICD-10-CM | POA: Diagnosis not present

## 2017-03-03 DIAGNOSIS — J449 Chronic obstructive pulmonary disease, unspecified: Secondary | ICD-10-CM | POA: Diagnosis not present

## 2017-03-03 DIAGNOSIS — I4891 Unspecified atrial fibrillation: Secondary | ICD-10-CM | POA: Diagnosis not present

## 2017-03-03 DIAGNOSIS — R69 Illness, unspecified: Secondary | ICD-10-CM | POA: Diagnosis not present

## 2017-03-03 DIAGNOSIS — I10 Essential (primary) hypertension: Secondary | ICD-10-CM | POA: Diagnosis not present

## 2017-03-19 DIAGNOSIS — M150 Primary generalized (osteo)arthritis: Secondary | ICD-10-CM | POA: Diagnosis not present

## 2017-03-19 DIAGNOSIS — E46 Unspecified protein-calorie malnutrition: Secondary | ICD-10-CM | POA: Diagnosis not present

## 2017-03-19 DIAGNOSIS — I4891 Unspecified atrial fibrillation: Secondary | ICD-10-CM | POA: Diagnosis not present

## 2017-03-19 DIAGNOSIS — I10 Essential (primary) hypertension: Secondary | ICD-10-CM | POA: Diagnosis not present

## 2017-03-25 DIAGNOSIS — I10 Essential (primary) hypertension: Secondary | ICD-10-CM | POA: Diagnosis not present

## 2017-03-25 DIAGNOSIS — I4891 Unspecified atrial fibrillation: Secondary | ICD-10-CM | POA: Diagnosis not present

## 2017-03-25 DIAGNOSIS — E876 Hypokalemia: Secondary | ICD-10-CM | POA: Diagnosis not present

## 2017-03-25 DIAGNOSIS — R69 Illness, unspecified: Secondary | ICD-10-CM | POA: Diagnosis not present

## 2017-03-31 DIAGNOSIS — I10 Essential (primary) hypertension: Secondary | ICD-10-CM | POA: Diagnosis not present

## 2017-03-31 DIAGNOSIS — J449 Chronic obstructive pulmonary disease, unspecified: Secondary | ICD-10-CM | POA: Diagnosis not present

## 2017-03-31 DIAGNOSIS — D508 Other iron deficiency anemias: Secondary | ICD-10-CM | POA: Diagnosis not present

## 2017-03-31 DIAGNOSIS — I4891 Unspecified atrial fibrillation: Secondary | ICD-10-CM | POA: Diagnosis not present

## 2017-04-02 DIAGNOSIS — Z79899 Other long term (current) drug therapy: Secondary | ICD-10-CM | POA: Diagnosis not present

## 2017-04-18 DIAGNOSIS — R400 Somnolence: Secondary | ICD-10-CM | POA: Diagnosis not present

## 2017-04-18 DIAGNOSIS — I10 Essential (primary) hypertension: Secondary | ICD-10-CM | POA: Diagnosis not present

## 2017-04-18 DIAGNOSIS — R32 Unspecified urinary incontinence: Secondary | ICD-10-CM | POA: Diagnosis not present

## 2017-04-18 DIAGNOSIS — I4891 Unspecified atrial fibrillation: Secondary | ICD-10-CM | POA: Diagnosis not present

## 2017-04-19 DIAGNOSIS — J439 Emphysema, unspecified: Secondary | ICD-10-CM | POA: Diagnosis not present

## 2017-04-19 DIAGNOSIS — R69 Illness, unspecified: Secondary | ICD-10-CM | POA: Diagnosis not present

## 2017-04-19 DIAGNOSIS — S72002D Fracture of unspecified part of neck of left femur, subsequent encounter for closed fracture with routine healing: Secondary | ICD-10-CM | POA: Diagnosis not present

## 2017-04-28 DIAGNOSIS — I4891 Unspecified atrial fibrillation: Secondary | ICD-10-CM | POA: Diagnosis not present

## 2017-04-28 DIAGNOSIS — I10 Essential (primary) hypertension: Secondary | ICD-10-CM | POA: Diagnosis not present

## 2017-04-28 DIAGNOSIS — R69 Illness, unspecified: Secondary | ICD-10-CM | POA: Diagnosis not present

## 2017-04-28 DIAGNOSIS — E876 Hypokalemia: Secondary | ICD-10-CM | POA: Diagnosis not present

## 2017-05-05 DIAGNOSIS — I4891 Unspecified atrial fibrillation: Secondary | ICD-10-CM | POA: Diagnosis not present

## 2017-05-05 DIAGNOSIS — I10 Essential (primary) hypertension: Secondary | ICD-10-CM | POA: Diagnosis not present

## 2017-05-05 DIAGNOSIS — R69 Illness, unspecified: Secondary | ICD-10-CM | POA: Diagnosis not present

## 2017-05-15 DIAGNOSIS — G4700 Insomnia, unspecified: Secondary | ICD-10-CM | POA: Diagnosis not present

## 2017-05-15 DIAGNOSIS — R69 Illness, unspecified: Secondary | ICD-10-CM | POA: Diagnosis not present

## 2017-05-15 DIAGNOSIS — F0390 Unspecified dementia without behavioral disturbance: Secondary | ICD-10-CM | POA: Diagnosis not present

## 2017-05-27 DIAGNOSIS — D508 Other iron deficiency anemias: Secondary | ICD-10-CM | POA: Diagnosis not present

## 2017-05-27 DIAGNOSIS — K219 Gastro-esophageal reflux disease without esophagitis: Secondary | ICD-10-CM | POA: Diagnosis not present

## 2017-05-27 DIAGNOSIS — I4891 Unspecified atrial fibrillation: Secondary | ICD-10-CM | POA: Diagnosis not present

## 2017-05-27 DIAGNOSIS — J449 Chronic obstructive pulmonary disease, unspecified: Secondary | ICD-10-CM | POA: Diagnosis not present

## 2017-06-02 DIAGNOSIS — J449 Chronic obstructive pulmonary disease, unspecified: Secondary | ICD-10-CM | POA: Diagnosis not present

## 2017-06-02 DIAGNOSIS — R69 Illness, unspecified: Secondary | ICD-10-CM | POA: Diagnosis not present

## 2017-06-02 DIAGNOSIS — I10 Essential (primary) hypertension: Secondary | ICD-10-CM | POA: Diagnosis not present

## 2017-06-02 DIAGNOSIS — I4891 Unspecified atrial fibrillation: Secondary | ICD-10-CM | POA: Diagnosis not present

## 2017-06-24 DIAGNOSIS — J449 Chronic obstructive pulmonary disease, unspecified: Secondary | ICD-10-CM | POA: Diagnosis not present

## 2017-06-24 DIAGNOSIS — R69 Illness, unspecified: Secondary | ICD-10-CM | POA: Diagnosis not present

## 2017-06-24 DIAGNOSIS — K219 Gastro-esophageal reflux disease without esophagitis: Secondary | ICD-10-CM | POA: Diagnosis not present

## 2017-06-24 DIAGNOSIS — D508 Other iron deficiency anemias: Secondary | ICD-10-CM | POA: Diagnosis not present

## 2017-07-10 DIAGNOSIS — G4700 Insomnia, unspecified: Secondary | ICD-10-CM | POA: Diagnosis not present

## 2017-07-10 DIAGNOSIS — R69 Illness, unspecified: Secondary | ICD-10-CM | POA: Diagnosis not present

## 2017-07-10 DIAGNOSIS — R4689 Other symptoms and signs involving appearance and behavior: Secondary | ICD-10-CM | POA: Diagnosis not present

## 2017-07-11 DIAGNOSIS — S72002D Fracture of unspecified part of neck of left femur, subsequent encounter for closed fracture with routine healing: Secondary | ICD-10-CM | POA: Diagnosis not present

## 2017-07-11 DIAGNOSIS — R69 Illness, unspecified: Secondary | ICD-10-CM | POA: Diagnosis not present

## 2017-07-11 DIAGNOSIS — J349 Unspecified disorder of nose and nasal sinuses: Secondary | ICD-10-CM | POA: Diagnosis not present

## 2017-07-17 DIAGNOSIS — R69 Illness, unspecified: Secondary | ICD-10-CM | POA: Diagnosis not present

## 2017-07-17 DIAGNOSIS — G4700 Insomnia, unspecified: Secondary | ICD-10-CM | POA: Diagnosis not present

## 2017-07-17 DIAGNOSIS — R400 Somnolence: Secondary | ICD-10-CM | POA: Diagnosis not present

## 2017-07-21 DIAGNOSIS — J449 Chronic obstructive pulmonary disease, unspecified: Secondary | ICD-10-CM | POA: Diagnosis not present

## 2017-07-21 DIAGNOSIS — I4891 Unspecified atrial fibrillation: Secondary | ICD-10-CM | POA: Diagnosis not present

## 2017-07-21 DIAGNOSIS — K219 Gastro-esophageal reflux disease without esophagitis: Secondary | ICD-10-CM | POA: Diagnosis not present

## 2017-07-21 DIAGNOSIS — R69 Illness, unspecified: Secondary | ICD-10-CM | POA: Diagnosis not present

## 2017-07-24 DIAGNOSIS — R400 Somnolence: Secondary | ICD-10-CM | POA: Diagnosis not present

## 2017-07-24 DIAGNOSIS — R69 Illness, unspecified: Secondary | ICD-10-CM | POA: Diagnosis not present

## 2017-07-24 DIAGNOSIS — R4689 Other symptoms and signs involving appearance and behavior: Secondary | ICD-10-CM | POA: Diagnosis not present

## 2017-07-28 DIAGNOSIS — E119 Type 2 diabetes mellitus without complications: Secondary | ICD-10-CM | POA: Diagnosis not present

## 2017-07-28 DIAGNOSIS — N390 Urinary tract infection, site not specified: Secondary | ICD-10-CM | POA: Diagnosis not present

## 2017-07-28 DIAGNOSIS — R69 Illness, unspecified: Secondary | ICD-10-CM | POA: Diagnosis not present

## 2017-07-28 DIAGNOSIS — S72002D Fracture of unspecified part of neck of left femur, subsequent encounter for closed fracture with routine healing: Secondary | ICD-10-CM | POA: Diagnosis not present

## 2017-07-30 DIAGNOSIS — H04123 Dry eye syndrome of bilateral lacrimal glands: Secondary | ICD-10-CM | POA: Diagnosis not present

## 2017-07-30 DIAGNOSIS — N390 Urinary tract infection, site not specified: Secondary | ICD-10-CM | POA: Diagnosis not present

## 2017-07-30 DIAGNOSIS — Z961 Presence of intraocular lens: Secondary | ICD-10-CM | POA: Diagnosis not present

## 2017-07-30 DIAGNOSIS — H353232 Exudative age-related macular degeneration, bilateral, with inactive choroidal neovascularization: Secondary | ICD-10-CM | POA: Diagnosis not present

## 2017-07-30 DIAGNOSIS — H43813 Vitreous degeneration, bilateral: Secondary | ICD-10-CM | POA: Diagnosis not present

## 2017-08-07 DIAGNOSIS — J449 Chronic obstructive pulmonary disease, unspecified: Secondary | ICD-10-CM | POA: Diagnosis not present

## 2017-08-07 DIAGNOSIS — D508 Other iron deficiency anemias: Secondary | ICD-10-CM | POA: Diagnosis not present

## 2017-08-07 DIAGNOSIS — I4891 Unspecified atrial fibrillation: Secondary | ICD-10-CM | POA: Diagnosis not present

## 2017-08-07 DIAGNOSIS — I10 Essential (primary) hypertension: Secondary | ICD-10-CM | POA: Diagnosis not present

## 2017-08-18 DIAGNOSIS — J449 Chronic obstructive pulmonary disease, unspecified: Secondary | ICD-10-CM | POA: Diagnosis not present

## 2017-08-18 DIAGNOSIS — R69 Illness, unspecified: Secondary | ICD-10-CM | POA: Diagnosis not present

## 2017-08-18 DIAGNOSIS — I2510 Atherosclerotic heart disease of native coronary artery without angina pectoris: Secondary | ICD-10-CM | POA: Diagnosis not present

## 2017-08-18 DIAGNOSIS — I4891 Unspecified atrial fibrillation: Secondary | ICD-10-CM | POA: Diagnosis not present

## 2017-09-04 DIAGNOSIS — I4891 Unspecified atrial fibrillation: Secondary | ICD-10-CM | POA: Diagnosis not present

## 2017-09-04 DIAGNOSIS — I10 Essential (primary) hypertension: Secondary | ICD-10-CM | POA: Diagnosis not present

## 2017-09-04 DIAGNOSIS — J449 Chronic obstructive pulmonary disease, unspecified: Secondary | ICD-10-CM | POA: Diagnosis not present

## 2017-09-04 DIAGNOSIS — K219 Gastro-esophageal reflux disease without esophagitis: Secondary | ICD-10-CM | POA: Diagnosis not present

## 2017-09-26 DIAGNOSIS — R5381 Other malaise: Secondary | ICD-10-CM | POA: Diagnosis not present

## 2017-09-26 DIAGNOSIS — M150 Primary generalized (osteo)arthritis: Secondary | ICD-10-CM | POA: Diagnosis not present

## 2017-09-26 DIAGNOSIS — I10 Essential (primary) hypertension: Secondary | ICD-10-CM | POA: Diagnosis not present

## 2017-09-26 DIAGNOSIS — R5382 Chronic fatigue, unspecified: Secondary | ICD-10-CM | POA: Diagnosis not present

## 2017-09-26 DIAGNOSIS — E46 Unspecified protein-calorie malnutrition: Secondary | ICD-10-CM | POA: Diagnosis not present

## 2017-09-26 DIAGNOSIS — I4891 Unspecified atrial fibrillation: Secondary | ICD-10-CM | POA: Diagnosis not present

## 2017-09-27 DIAGNOSIS — I4891 Unspecified atrial fibrillation: Secondary | ICD-10-CM | POA: Diagnosis not present

## 2017-09-27 DIAGNOSIS — M150 Primary generalized (osteo)arthritis: Secondary | ICD-10-CM | POA: Diagnosis not present

## 2017-09-27 DIAGNOSIS — I10 Essential (primary) hypertension: Secondary | ICD-10-CM | POA: Diagnosis not present

## 2017-09-27 DIAGNOSIS — D508 Other iron deficiency anemias: Secondary | ICD-10-CM | POA: Diagnosis not present

## 2017-09-30 DIAGNOSIS — G4700 Insomnia, unspecified: Secondary | ICD-10-CM | POA: Diagnosis not present

## 2017-09-30 DIAGNOSIS — R69 Illness, unspecified: Secondary | ICD-10-CM | POA: Diagnosis not present

## 2017-09-30 DIAGNOSIS — F22 Delusional disorders: Secondary | ICD-10-CM | POA: Diagnosis not present

## 2017-10-02 DIAGNOSIS — I739 Peripheral vascular disease, unspecified: Secondary | ICD-10-CM | POA: Diagnosis not present

## 2017-10-02 DIAGNOSIS — L603 Nail dystrophy: Secondary | ICD-10-CM | POA: Diagnosis not present

## 2017-10-02 DIAGNOSIS — B351 Tinea unguium: Secondary | ICD-10-CM | POA: Diagnosis not present

## 2017-10-17 DIAGNOSIS — I10 Essential (primary) hypertension: Secondary | ICD-10-CM | POA: Diagnosis not present

## 2017-10-31 DIAGNOSIS — J449 Chronic obstructive pulmonary disease, unspecified: Secondary | ICD-10-CM | POA: Diagnosis not present

## 2017-10-31 DIAGNOSIS — I4891 Unspecified atrial fibrillation: Secondary | ICD-10-CM | POA: Diagnosis not present

## 2017-10-31 DIAGNOSIS — I10 Essential (primary) hypertension: Secondary | ICD-10-CM | POA: Diagnosis not present

## 2017-10-31 DIAGNOSIS — K219 Gastro-esophageal reflux disease without esophagitis: Secondary | ICD-10-CM | POA: Diagnosis not present

## 2017-11-13 DIAGNOSIS — F22 Delusional disorders: Secondary | ICD-10-CM | POA: Diagnosis not present

## 2017-11-13 DIAGNOSIS — R69 Illness, unspecified: Secondary | ICD-10-CM | POA: Diagnosis not present

## 2017-11-13 DIAGNOSIS — F329 Major depressive disorder, single episode, unspecified: Secondary | ICD-10-CM | POA: Diagnosis not present

## 2017-11-13 DIAGNOSIS — G4700 Insomnia, unspecified: Secondary | ICD-10-CM | POA: Diagnosis not present

## 2017-11-24 DIAGNOSIS — I10 Essential (primary) hypertension: Secondary | ICD-10-CM | POA: Diagnosis not present

## 2017-11-24 DIAGNOSIS — R69 Illness, unspecified: Secondary | ICD-10-CM | POA: Diagnosis not present

## 2017-11-28 DIAGNOSIS — K219 Gastro-esophageal reflux disease without esophagitis: Secondary | ICD-10-CM | POA: Diagnosis not present

## 2017-11-28 DIAGNOSIS — J449 Chronic obstructive pulmonary disease, unspecified: Secondary | ICD-10-CM | POA: Diagnosis not present

## 2017-11-28 DIAGNOSIS — I10 Essential (primary) hypertension: Secondary | ICD-10-CM | POA: Diagnosis not present

## 2017-11-28 DIAGNOSIS — I4891 Unspecified atrial fibrillation: Secondary | ICD-10-CM | POA: Diagnosis not present

## 2017-11-29 DIAGNOSIS — J449 Chronic obstructive pulmonary disease, unspecified: Secondary | ICD-10-CM | POA: Diagnosis not present

## 2017-11-29 DIAGNOSIS — K219 Gastro-esophageal reflux disease without esophagitis: Secondary | ICD-10-CM | POA: Diagnosis not present

## 2017-11-29 DIAGNOSIS — D508 Other iron deficiency anemias: Secondary | ICD-10-CM | POA: Diagnosis not present

## 2017-11-29 DIAGNOSIS — I4891 Unspecified atrial fibrillation: Secondary | ICD-10-CM | POA: Diagnosis not present

## 2017-12-12 DIAGNOSIS — R400 Somnolence: Secondary | ICD-10-CM | POA: Diagnosis not present

## 2017-12-12 DIAGNOSIS — J449 Chronic obstructive pulmonary disease, unspecified: Secondary | ICD-10-CM | POA: Diagnosis not present

## 2017-12-12 DIAGNOSIS — R69 Illness, unspecified: Secondary | ICD-10-CM | POA: Diagnosis not present

## 2017-12-12 DIAGNOSIS — R32 Unspecified urinary incontinence: Secondary | ICD-10-CM | POA: Diagnosis not present

## 2017-12-15 DIAGNOSIS — N390 Urinary tract infection, site not specified: Secondary | ICD-10-CM | POA: Diagnosis not present

## 2017-12-15 DIAGNOSIS — E860 Dehydration: Secondary | ICD-10-CM | POA: Diagnosis not present

## 2017-12-15 DIAGNOSIS — I10 Essential (primary) hypertension: Secondary | ICD-10-CM | POA: Diagnosis not present

## 2017-12-19 DIAGNOSIS — I10 Essential (primary) hypertension: Secondary | ICD-10-CM | POA: Diagnosis not present

## 2017-12-19 DIAGNOSIS — R69 Illness, unspecified: Secondary | ICD-10-CM | POA: Diagnosis not present

## 2017-12-28 DIAGNOSIS — G4751 Confusional arousals: Secondary | ICD-10-CM | POA: Diagnosis not present

## 2018-01-03 DIAGNOSIS — D508 Other iron deficiency anemias: Secondary | ICD-10-CM | POA: Diagnosis not present

## 2018-01-03 DIAGNOSIS — I4891 Unspecified atrial fibrillation: Secondary | ICD-10-CM | POA: Diagnosis not present

## 2018-01-03 DIAGNOSIS — E46 Unspecified protein-calorie malnutrition: Secondary | ICD-10-CM | POA: Diagnosis not present

## 2018-01-03 DIAGNOSIS — I10 Essential (primary) hypertension: Secondary | ICD-10-CM | POA: Diagnosis not present

## 2018-01-05 DIAGNOSIS — I739 Peripheral vascular disease, unspecified: Secondary | ICD-10-CM | POA: Diagnosis not present

## 2018-01-05 DIAGNOSIS — N390 Urinary tract infection, site not specified: Secondary | ICD-10-CM | POA: Diagnosis not present

## 2018-01-05 DIAGNOSIS — R400 Somnolence: Secondary | ICD-10-CM | POA: Diagnosis not present

## 2018-01-05 DIAGNOSIS — L603 Nail dystrophy: Secondary | ICD-10-CM | POA: Diagnosis not present

## 2018-01-05 DIAGNOSIS — R69 Illness, unspecified: Secondary | ICD-10-CM | POA: Diagnosis not present

## 2018-01-05 DIAGNOSIS — I10 Essential (primary) hypertension: Secondary | ICD-10-CM | POA: Diagnosis not present

## 2018-01-08 DIAGNOSIS — G4700 Insomnia, unspecified: Secondary | ICD-10-CM | POA: Diagnosis not present

## 2018-01-08 DIAGNOSIS — F22 Delusional disorders: Secondary | ICD-10-CM | POA: Diagnosis not present

## 2018-01-08 DIAGNOSIS — R69 Illness, unspecified: Secondary | ICD-10-CM | POA: Diagnosis not present

## 2018-01-08 DIAGNOSIS — F329 Major depressive disorder, single episode, unspecified: Secondary | ICD-10-CM | POA: Diagnosis not present

## 2018-01-09 DIAGNOSIS — I952 Hypotension due to drugs: Secondary | ICD-10-CM | POA: Diagnosis not present

## 2018-01-19 DIAGNOSIS — R69 Illness, unspecified: Secondary | ICD-10-CM | POA: Diagnosis not present

## 2018-01-19 DIAGNOSIS — I10 Essential (primary) hypertension: Secondary | ICD-10-CM | POA: Diagnosis not present

## 2018-01-22 DIAGNOSIS — R69 Illness, unspecified: Secondary | ICD-10-CM | POA: Diagnosis not present

## 2018-01-22 DIAGNOSIS — F329 Major depressive disorder, single episode, unspecified: Secondary | ICD-10-CM | POA: Diagnosis not present

## 2018-01-23 DIAGNOSIS — I9589 Other hypotension: Secondary | ICD-10-CM | POA: Diagnosis not present

## 2018-01-23 DIAGNOSIS — E861 Hypovolemia: Secondary | ICD-10-CM | POA: Diagnosis not present

## 2018-01-23 DIAGNOSIS — E46 Unspecified protein-calorie malnutrition: Secondary | ICD-10-CM | POA: Diagnosis not present

## 2018-02-02 DIAGNOSIS — E46 Unspecified protein-calorie malnutrition: Secondary | ICD-10-CM | POA: Diagnosis not present

## 2018-02-02 DIAGNOSIS — R627 Adult failure to thrive: Secondary | ICD-10-CM | POA: Diagnosis not present

## 2018-02-02 DIAGNOSIS — J449 Chronic obstructive pulmonary disease, unspecified: Secondary | ICD-10-CM | POA: Diagnosis not present

## 2018-02-02 DIAGNOSIS — R69 Illness, unspecified: Secondary | ICD-10-CM | POA: Diagnosis not present

## 2018-02-06 DIAGNOSIS — E46 Unspecified protein-calorie malnutrition: Secondary | ICD-10-CM | POA: Diagnosis not present

## 2018-02-06 DIAGNOSIS — R69 Illness, unspecified: Secondary | ICD-10-CM | POA: Diagnosis not present

## 2018-02-06 DIAGNOSIS — I952 Hypotension due to drugs: Secondary | ICD-10-CM | POA: Diagnosis not present

## 2018-02-08 DIAGNOSIS — I10 Essential (primary) hypertension: Secondary | ICD-10-CM | POA: Diagnosis not present

## 2018-02-08 DIAGNOSIS — J449 Chronic obstructive pulmonary disease, unspecified: Secondary | ICD-10-CM | POA: Diagnosis not present

## 2018-02-08 DIAGNOSIS — Z0000 Encounter for general adult medical examination without abnormal findings: Secondary | ICD-10-CM | POA: Diagnosis not present

## 2018-02-08 DIAGNOSIS — D508 Other iron deficiency anemias: Secondary | ICD-10-CM | POA: Diagnosis not present

## 2018-02-08 DIAGNOSIS — K219 Gastro-esophageal reflux disease without esophagitis: Secondary | ICD-10-CM | POA: Diagnosis not present

## 2018-03-06 DIAGNOSIS — I4811 Longstanding persistent atrial fibrillation: Secondary | ICD-10-CM | POA: Diagnosis not present

## 2018-03-06 DIAGNOSIS — I952 Hypotension due to drugs: Secondary | ICD-10-CM | POA: Diagnosis not present

## 2018-03-06 DIAGNOSIS — E46 Unspecified protein-calorie malnutrition: Secondary | ICD-10-CM | POA: Diagnosis not present

## 2018-03-06 DIAGNOSIS — I2510 Atherosclerotic heart disease of native coronary artery without angina pectoris: Secondary | ICD-10-CM | POA: Diagnosis not present

## 2018-03-10 DIAGNOSIS — R52 Pain, unspecified: Secondary | ICD-10-CM | POA: Diagnosis not present

## 2018-03-10 DIAGNOSIS — E46 Unspecified protein-calorie malnutrition: Secondary | ICD-10-CM | POA: Diagnosis not present

## 2018-03-10 DIAGNOSIS — R69 Illness, unspecified: Secondary | ICD-10-CM | POA: Diagnosis not present

## 2018-03-21 DEATH — deceased

## 2019-04-17 ENCOUNTER — Other Ambulatory Visit: Payer: Self-pay | Admitting: Nurse Practitioner

## 2020-06-05 ENCOUNTER — Other Ambulatory Visit (HOSPITAL_COMMUNITY): Payer: Self-pay
